# Patient Record
Sex: Male | Born: 1975 | Race: Black or African American | Hispanic: No | Smoking: Current some day smoker
Health system: Southern US, Community
[De-identification: ages and names within clinical notes are randomized; demographics above are authoritative.]

## PROBLEM LIST (undated history)

## (undated) DIAGNOSIS — F419 Anxiety disorder, unspecified: Secondary | ICD-10-CM

## (undated) DIAGNOSIS — Z789 Other specified health status: Secondary | ICD-10-CM

## (undated) DIAGNOSIS — N189 Chronic kidney disease, unspecified: Secondary | ICD-10-CM

## (undated) DIAGNOSIS — K219 Gastro-esophageal reflux disease without esophagitis: Secondary | ICD-10-CM

## (undated) DIAGNOSIS — F1910 Other psychoactive substance abuse, uncomplicated: Secondary | ICD-10-CM

## (undated) DIAGNOSIS — J329 Chronic sinusitis, unspecified: Secondary | ICD-10-CM

## (undated) DIAGNOSIS — F1410 Cocaine abuse, uncomplicated: Secondary | ICD-10-CM

## (undated) DIAGNOSIS — R569 Unspecified convulsions: Secondary | ICD-10-CM

## (undated) DIAGNOSIS — F32A Depression, unspecified: Secondary | ICD-10-CM

## (undated) DIAGNOSIS — F329 Major depressive disorder, single episode, unspecified: Secondary | ICD-10-CM

## (undated) HISTORY — DX: Chronic kidney disease, unspecified: N18.9

## (undated) HISTORY — PX: FEMUR SURGERY: SHX943

---

## 2007-07-24 ENCOUNTER — Emergency Department (HOSPITAL_COMMUNITY): Admission: EM | Admit: 2007-07-24 | Discharge: 2007-07-24 | Payer: Self-pay | Admitting: Emergency Medicine

## 2007-09-20 ENCOUNTER — Emergency Department (HOSPITAL_COMMUNITY): Admission: EM | Admit: 2007-09-20 | Discharge: 2007-09-20 | Payer: Self-pay | Admitting: Emergency Medicine

## 2008-08-11 ENCOUNTER — Emergency Department (HOSPITAL_COMMUNITY): Admission: EM | Admit: 2008-08-11 | Discharge: 2008-08-11 | Payer: Self-pay | Admitting: Family Medicine

## 2008-10-24 ENCOUNTER — Emergency Department (HOSPITAL_COMMUNITY): Admission: EM | Admit: 2008-10-24 | Discharge: 2008-10-24 | Payer: Self-pay | Admitting: Family Medicine

## 2009-05-10 ENCOUNTER — Emergency Department (HOSPITAL_COMMUNITY): Admission: EM | Admit: 2009-05-10 | Discharge: 2009-05-10 | Payer: Self-pay | Admitting: Emergency Medicine

## 2010-01-27 ENCOUNTER — Emergency Department (HOSPITAL_COMMUNITY): Admission: EM | Admit: 2010-01-27 | Discharge: 2010-01-28 | Payer: Self-pay | Admitting: Emergency Medicine

## 2010-02-06 ENCOUNTER — Emergency Department (HOSPITAL_COMMUNITY): Admission: EM | Admit: 2010-02-06 | Discharge: 2010-02-06 | Payer: Self-pay | Admitting: Emergency Medicine

## 2010-03-10 ENCOUNTER — Ambulatory Visit: Payer: Self-pay | Admitting: Internal Medicine

## 2010-03-10 ENCOUNTER — Observation Stay (HOSPITAL_COMMUNITY): Admission: EM | Admit: 2010-03-10 | Discharge: 2010-03-11 | Payer: Self-pay | Admitting: Emergency Medicine

## 2010-03-21 ENCOUNTER — Emergency Department (HOSPITAL_COMMUNITY): Admission: EM | Admit: 2010-03-21 | Discharge: 2010-03-21 | Payer: Self-pay | Admitting: Emergency Medicine

## 2010-05-01 ENCOUNTER — Emergency Department (HOSPITAL_COMMUNITY): Admission: EM | Admit: 2010-05-01 | Discharge: 2010-05-01 | Payer: Self-pay | Admitting: Emergency Medicine

## 2010-05-11 ENCOUNTER — Emergency Department (HOSPITAL_COMMUNITY): Admission: EM | Admit: 2010-05-11 | Discharge: 2010-05-11 | Payer: Self-pay | Admitting: Emergency Medicine

## 2010-05-26 ENCOUNTER — Emergency Department (HOSPITAL_COMMUNITY): Admission: EM | Admit: 2010-05-26 | Discharge: 2010-05-26 | Payer: Self-pay | Admitting: Emergency Medicine

## 2010-07-18 ENCOUNTER — Emergency Department (HOSPITAL_COMMUNITY): Admission: EM | Admit: 2010-07-18 | Discharge: 2010-07-18 | Payer: Self-pay | Admitting: Emergency Medicine

## 2010-07-28 ENCOUNTER — Emergency Department (HOSPITAL_COMMUNITY): Admission: EM | Admit: 2010-07-28 | Discharge: 2010-07-28 | Payer: Self-pay | Admitting: Family Medicine

## 2010-07-28 ENCOUNTER — Emergency Department (HOSPITAL_COMMUNITY): Admission: EM | Admit: 2010-07-28 | Discharge: 2010-07-28 | Payer: Self-pay | Admitting: Emergency Medicine

## 2010-11-22 LAB — CBC
HCT: 42.5 % (ref 39.0–52.0)
MCH: 32.1 pg (ref 26.0–34.0)
MCHC: 33.9 g/dL (ref 30.0–36.0)
MCV: 94.7 fL (ref 78.0–100.0)
RDW: 12.1 % (ref 11.5–15.5)

## 2010-11-22 LAB — URINALYSIS, ROUTINE W REFLEX MICROSCOPIC
Bilirubin Urine: NEGATIVE
Nitrite: NEGATIVE
Specific Gravity, Urine: 1.021 (ref 1.005–1.030)
pH: 7 (ref 5.0–8.0)

## 2010-11-22 LAB — RAPID URINE DRUG SCREEN, HOSP PERFORMED
Barbiturates: NOT DETECTED
Cocaine: POSITIVE — AB
Opiates: NOT DETECTED

## 2010-11-22 LAB — COMPREHENSIVE METABOLIC PANEL
ALT: 15 U/L (ref 0–53)
AST: 18 U/L (ref 0–37)
Alkaline Phosphatase: 55 U/L (ref 39–117)
CO2: 24 mEq/L (ref 19–32)
GFR calc Af Amer: >60 mL/min (ref >60)
Glucose, Bld: 94 mg/dL (ref 70–99)
Potassium: 4.2 mEq/L (ref 3.5–5.1)
Sodium: 135 mEq/L (ref 135–145)
Total Protein: 7.6 g/dL (ref 6.0–8.3)

## 2010-11-22 LAB — DIFFERENTIAL
Basophils Absolute: 0.1 10*3/uL (ref 0.0–0.1)
Basophils Relative: 1 % (ref 0–1)
Eosinophils Relative: 2 % (ref 0–5)
Monocytes Absolute: 0.6 10*3/uL (ref 0.1–1.0)
Monocytes Relative: 6 % (ref 3–12)

## 2010-11-22 LAB — ACETAMINOPHEN LEVEL: Acetaminophen (Tylenol), Serum: <10 ug/mL — ABNORMAL LOW (ref 10–30)

## 2010-11-22 LAB — SALICYLATE LEVEL: Salicylate Lvl: 8.9 mg/dL (ref 2.8–20.0)

## 2010-11-25 LAB — POCT I-STAT, CHEM 8
BUN: 11 mg/dL (ref 6–23)
Calcium, Ion: 1.21 mmol/L (ref 1.12–1.32)
Chloride: 106 mEq/L (ref 96–112)
Creatinine, Ser: 1.3 mg/dL (ref 0.4–1.5)
Glucose, Bld: 128 mg/dL — ABNORMAL HIGH (ref 70–99)
Glucose, Bld: 132 mg/dL — ABNORMAL HIGH (ref 70–99)
HCT: 42 % (ref 39.0–52.0)
Hemoglobin: 14.3 g/dL (ref 13.0–17.0)
Hemoglobin: 15 g/dL (ref 13.0–17.0)
Potassium: 3.4 mEq/L — ABNORMAL LOW (ref 3.5–5.1)
TCO2: 24 mmol/L (ref 0–100)

## 2010-11-25 LAB — CBC
HCT: 38.4 % — ABNORMAL LOW (ref 39.0–52.0)
HCT: 40.6 % (ref 39.0–52.0)
Hemoglobin: 13.1 g/dL (ref 13.0–17.0)
Hemoglobin: 14.3 g/dL (ref 13.0–17.0)
MCH: 32 pg (ref 26.0–34.0)
MCH: 33.3 pg (ref 26.0–34.0)
MCHC: 34.1 g/dL (ref 30.0–36.0)
MCHC: 35.2 g/dL (ref 30.0–36.0)
RBC: 4.3 MIL/uL (ref 4.22–5.81)
RDW: 12.3 % (ref 11.5–15.5)

## 2010-11-25 LAB — BASIC METABOLIC PANEL
CO2: 27 mEq/L (ref 19–32)
Calcium: 9.9 mg/dL (ref 8.4–10.5)
GFR calc Af Amer: >60 mL/min (ref >60)
Glucose, Bld: 134 mg/dL — ABNORMAL HIGH (ref 70–99)
Potassium: 3.2 mEq/L — ABNORMAL LOW (ref 3.5–5.1)
Sodium: 141 mEq/L (ref 135–145)

## 2010-11-25 LAB — URINALYSIS, ROUTINE W REFLEX MICROSCOPIC
Bilirubin Urine: NEGATIVE
Ketones, ur: NEGATIVE mg/dL
Nitrite: NEGATIVE
Protein, ur: NEGATIVE mg/dL
Urobilinogen, UA: 1 mg/dL (ref 0.0–1.0)

## 2010-11-25 LAB — DIFFERENTIAL
Basophils Absolute: 0 10*3/uL (ref 0.0–0.1)
Basophils Relative: 1 % (ref 0–1)
Eosinophils Absolute: 0.4 10*3/uL (ref 0.0–0.7)
Eosinophils Relative: 6 % — ABNORMAL HIGH (ref 0–5)
Lymphs Abs: 3.9 10*3/uL (ref 0.7–4.0)
Monocytes Absolute: 0.6 10*3/uL (ref 0.1–1.0)
Monocytes Absolute: 0.6 10*3/uL (ref 0.1–1.0)
Monocytes Relative: 8 % (ref 3–12)
Monocytes Relative: 9 % (ref 3–12)
Neutro Abs: 3.1 10*3/uL (ref 1.7–7.7)
Neutro Abs: 3.2 10*3/uL (ref 1.7–7.7)
Neutrophils Relative %: 40 % — ABNORMAL LOW (ref 43–77)

## 2010-11-25 LAB — RAPID URINE DRUG SCREEN, HOSP PERFORMED
Amphetamines: NOT DETECTED
Barbiturates: NOT DETECTED
Benzodiazepines: NOT DETECTED
Cocaine: POSITIVE — AB

## 2010-11-25 LAB — PROTIME-INR: INR: 0.9 (ref 0.00–1.49)

## 2010-11-25 LAB — POCT CARDIAC MARKERS
CKMB, poc: <1 ng/mL — ABNORMAL LOW (ref 1.0–8.0)
Troponin i, poc: <0.05 ng/mL (ref 0.00–0.09)

## 2010-11-25 LAB — URINE MICROSCOPIC-ADD ON

## 2010-11-27 LAB — BASIC METABOLIC PANEL
BUN: 19 mg/dL (ref 6–23)
Calcium: 9 mg/dL (ref 8.4–10.5)
Chloride: 106 mEq/L (ref 96–112)
GFR calc Af Amer: >60 mL/min (ref >60)
GFR calc Af Amer: >60 mL/min (ref >60)
GFR calc non Af Amer: >60 mL/min (ref >60)
GFR calc non Af Amer: >60 mL/min (ref >60)
Glucose, Bld: 96 mg/dL (ref 70–99)
Potassium: 4.2 mEq/L (ref 3.5–5.1)
Potassium: 3.7 mEq/L (ref 3.5–5.1)
Sodium: 141 mEq/L (ref 135–145)
Sodium: 138 mEq/L (ref 135–145)

## 2010-11-27 LAB — RAPID URINE DRUG SCREEN, HOSP PERFORMED
Amphetamines: NOT DETECTED
Barbiturates: NOT DETECTED
Benzodiazepines: NOT DETECTED
Cocaine: POSITIVE — AB
Opiates: NOT DETECTED
Tetrahydrocannabinol: NOT DETECTED

## 2010-11-27 LAB — CBC
HCT: 34.3 % — ABNORMAL LOW (ref 39.0–52.0)
HCT: 43.2 % (ref 39.0–52.0)
Hemoglobin: 11.8 g/dL — ABNORMAL LOW (ref 13.0–17.0)
Hemoglobin: 14.8 g/dL (ref 13.0–17.0)
MCH: 34.4 pg — ABNORMAL HIGH (ref 26.0–34.0)
MCHC: 34.3 g/dL (ref 30.0–36.0)
MCV: 99.8 fL (ref 78.0–100.0)
RBC: 3.45 MIL/uL — ABNORMAL LOW (ref 4.22–5.81)
RBC: 4.32 MIL/uL (ref 4.22–5.81)
RDW: 12.4 % (ref 11.5–15.5)
RDW: 12.7 % (ref 11.5–15.5)
WBC: 7 10*3/uL (ref 4.0–10.5)
WBC: 11.3 10*3/uL — ABNORMAL HIGH (ref 4.0–10.5)

## 2010-11-27 LAB — POCT I-STAT, CHEM 8
Calcium, Ion: 1.23 mmol/L (ref 1.12–1.32)
Creatinine, Ser: 1.4 mg/dL (ref 0.4–1.5)
Glucose, Bld: 109 mg/dL — ABNORMAL HIGH (ref 70–99)
HCT: 42 % (ref 39.0–52.0)
Hemoglobin: 14.3 g/dL (ref 13.0–17.0)
Potassium: 3.7 mEq/L (ref 3.5–5.1)
TCO2: 25 mmol/L (ref 0–100)

## 2010-11-27 LAB — URINALYSIS, ROUTINE W REFLEX MICROSCOPIC
Glucose, UA: NEGATIVE mg/dL
Hgb urine dipstick: NEGATIVE
Ketones, ur: 15 mg/dL — AB
Protein, ur: NEGATIVE mg/dL
Urobilinogen, UA: 1 mg/dL (ref 0.0–1.0)

## 2010-11-27 LAB — DIFFERENTIAL
Basophils Absolute: 0 10*3/uL (ref 0.0–0.1)
Basophils Absolute: 0 10*3/uL (ref 0.0–0.1)
Basophils Relative: 1 % (ref 0–1)
Eosinophils Absolute: 0.2 10*3/uL (ref 0.0–0.7)
Lymphocytes Relative: 45 % (ref 12–46)
Lymphs Abs: 5 10*3/uL — ABNORMAL HIGH (ref 0.7–4.0)
Monocytes Absolute: 0.6 10*3/uL (ref 0.1–1.0)
Neutro Abs: 6.1 10*3/uL (ref 1.7–7.7)
Neutro Abs: 5.4 10*3/uL (ref 1.7–7.7)
Neutrophils Relative %: 62 % (ref 43–77)

## 2010-11-27 LAB — POCT CARDIAC MARKERS
CKMB, poc: 1 ng/mL (ref 1.0–8.0)
Myoglobin, poc: 28.7 ng/mL (ref 12–200)
Troponin i, poc: <0.05 ng/mL (ref 0.00–0.09)

## 2010-11-27 LAB — CARDIAC PANEL(CRET KIN+CKTOT+MB+TROPI)
CK, MB: 1.7 ng/mL (ref 0.3–4.0)
Relative Index: 0.8 (ref 0.0–2.5)
Total CK: 208 U/L (ref 7–232)
Troponin I: 0.02 ng/mL (ref 0.00–0.06)

## 2010-11-27 LAB — ETHANOL
Alcohol, Ethyl (B): <5 mg/dL (ref 0–10)
Alcohol, Ethyl (B): <5 mg/dL (ref 0–10)

## 2010-11-27 LAB — URINE MICROSCOPIC-ADD ON

## 2010-11-27 LAB — ACETAMINOPHEN LEVEL
Acetaminophen (Tylenol), Serum: <10 ug/mL — ABNORMAL LOW (ref 10–30)
Acetaminophen (Tylenol), Serum: <10 ug/mL — ABNORMAL LOW (ref 10–30)

## 2010-11-28 LAB — BASIC METABOLIC PANEL
BUN: 12 mg/dL (ref 6–23)
CO2: 29 mEq/L (ref 19–32)
Chloride: 105 mEq/L (ref 96–112)
GFR calc Af Amer: >60 mL/min (ref >60)
Potassium: 4.3 mEq/L (ref 3.5–5.1)

## 2010-11-28 LAB — CBC
HCT: 38.1 % — ABNORMAL LOW (ref 39.0–52.0)
MCHC: 34.2 g/dL (ref 30.0–36.0)
MCV: 99.1 fL (ref 78.0–100.0)
RBC: 3.85 MIL/uL — ABNORMAL LOW (ref 4.22–5.81)
RBC: 3.94 MIL/uL — ABNORMAL LOW (ref 4.22–5.81)
WBC: 6.5 10*3/uL (ref 4.0–10.5)
WBC: 7.4 10*3/uL (ref 4.0–10.5)

## 2010-11-28 LAB — POCT I-STAT, CHEM 8
Chloride: 111 mEq/L (ref 96–112)
Glucose, Bld: 86 mg/dL (ref 70–99)
HCT: 41 % (ref 39.0–52.0)
Potassium: 3.6 mEq/L (ref 3.5–5.1)
Sodium: 140 mEq/L (ref 135–145)

## 2010-11-28 LAB — POCT CARDIAC MARKERS
CKMB, poc: <1 ng/mL — ABNORMAL LOW (ref 1.0–8.0)
CKMB, poc: <1 ng/mL — ABNORMAL LOW (ref 1.0–8.0)
Myoglobin, poc: 26.2 ng/mL (ref 12–200)
Myoglobin, poc: 42.6 ng/mL (ref 12–200)
Troponin i, poc: <0.05 ng/mL (ref 0.00–0.09)

## 2010-11-28 LAB — DIFFERENTIAL
Basophils Relative: 1 % (ref 0–1)
Monocytes Relative: 9 % (ref 3–12)
Neutro Abs: 3.5 10*3/uL (ref 1.7–7.7)
Neutrophils Relative %: 47 % (ref 43–77)

## 2011-01-22 ENCOUNTER — Emergency Department (HOSPITAL_COMMUNITY): Payer: Self-pay

## 2011-01-22 ENCOUNTER — Inpatient Hospital Stay (HOSPITAL_COMMUNITY)
Admission: EM | Admit: 2011-01-22 | Discharge: 2011-01-23 | DRG: 394 | Disposition: A | Discharge status: Home / Self Care | Payer: Self-pay | Attending: Internal Medicine | Admitting: Internal Medicine

## 2011-01-22 DIAGNOSIS — F1420 Cocaine dependence, uncomplicated: Secondary | ICD-10-CM

## 2011-01-22 DIAGNOSIS — F17200 Nicotine dependence, unspecified, uncomplicated: Secondary | ICD-10-CM | POA: Diagnosis present

## 2011-01-22 DIAGNOSIS — E860 Dehydration: Secondary | ICD-10-CM | POA: Diagnosis present

## 2011-01-22 DIAGNOSIS — K920 Hematemesis: Secondary | ICD-10-CM | POA: Diagnosis present

## 2011-01-22 DIAGNOSIS — IMO0002 Reserved for concepts with insufficient information to code with codable children: Secondary | ICD-10-CM | POA: Diagnosis present

## 2011-01-22 DIAGNOSIS — R0789 Other chest pain: Secondary | ICD-10-CM | POA: Diagnosis present

## 2011-01-22 DIAGNOSIS — T189XXA Foreign body of alimentary tract, part unspecified, initial encounter: Principal | ICD-10-CM | POA: Diagnosis present

## 2011-01-22 DIAGNOSIS — F1210 Cannabis abuse, uncomplicated: Secondary | ICD-10-CM | POA: Diagnosis present

## 2011-01-22 DIAGNOSIS — K219 Gastro-esophageal reflux disease without esophagitis: Secondary | ICD-10-CM | POA: Diagnosis present

## 2011-01-22 DIAGNOSIS — F1010 Alcohol abuse, uncomplicated: Secondary | ICD-10-CM | POA: Diagnosis present

## 2011-01-22 DIAGNOSIS — F1410 Cocaine abuse, uncomplicated: Secondary | ICD-10-CM | POA: Diagnosis present

## 2011-01-22 LAB — CBC
MCHC: 35 g/dL (ref 30.0–36.0)
Platelets: 178 10*3/uL (ref 150–400)
RDW: 12.6 % (ref 11.5–15.5)
WBC: 8.6 10*3/uL (ref 4.0–10.5)

## 2011-01-22 LAB — POCT CARDIAC MARKERS
CKMB, poc: 1.2 ng/mL (ref 1.0–8.0)
Myoglobin, poc: 148 ng/mL (ref 12–200)

## 2011-01-22 LAB — DIFFERENTIAL
Basophils Absolute: 0 10*3/uL (ref 0.0–0.1)
Basophils Relative: 0 % (ref 0–1)
Eosinophils Absolute: 0.1 10*3/uL (ref 0.0–0.7)
Eosinophils Relative: 1 % (ref 0–5)
Lymphocytes Relative: 40 % (ref 12–46)

## 2011-01-22 LAB — BASIC METABOLIC PANEL
BUN: 13 mg/dL (ref 6–23)
CO2: 32 mEq/L (ref 19–32)
Calcium: 11.4 mg/dL — ABNORMAL HIGH (ref 8.4–10.5)
GFR calc non Af Amer: >60 mL/min (ref >60)
Glucose, Bld: 86 mg/dL (ref 70–99)

## 2011-01-22 LAB — CARDIAC PANEL(CRET KIN+CKTOT+MB+TROPI)
CK, MB: 2.3 ng/mL (ref 0.3–4.0)
CK, MB: 2.6 ng/mL (ref 0.3–4.0)
Relative Index: 0.3 (ref 0.0–2.5)
Total CK: 691 U/L — ABNORMAL HIGH (ref 7–232)
Total CK: 769 U/L — ABNORMAL HIGH (ref 7–232)

## 2011-01-22 NOTE — H&P (Signed)
Henry Stanley, Henry Stanley NO.:  [id number]  MEDICAL RECORD NO.:  [id number]           PATIENT TYPE:  E  LOCATION:  [facility]                         FACILITY:  [facility]  PHYSICIAN:  Andreas Blower, MD       DATE OF BIRTH:  September 02, 1976  DATE OF ADMISSION:  01/22/2011 DATE OF DISCHARGE:                             HISTORY & PHYSICAL   PRIMARY CARE PHYSICIAN:  The patient does not have one.  CHIEF COMPLAINT:  Chest pain.  HISTORY OF PRESENT ILLNESS:  Henry Stanley is a 35 year old gentleman with history of polysubstance abuse, GERD, who presented with complaint of chest pain.  The patient reports that he daily snorts cocaine as well as uses marijuana daily.  He also smokes one-pack per day of cigarettes and drinks one pint of alcohol every other day.  Around 11:30 p.m. on Jan 21, 2011, the patient swallowed a plastic bag of cocaine worth about 3 g last night as somebody was looking for that cocaine.  Subsequently after that the patient drank a large amounts of milk and tried to self-induce vomiting.  He reports that he tried inducing the vomiting quite forcefully and at times had tinge of blood with emesis.  He was not able to bring the bag of cocaine up as a result he presented to the ER for further evaluation.  Given the forceful induction of vomiting, he then also had some chest pain.  While in the ER, his chest pain has significantly improved.  The patient denies any recent fevers or chills. Denies any current nausea or vomiting.  Does complain about chest pain mainly located in his chest.  Does not complain of any radiating pain down his arms or to his neck.  Denies any shortness of breath.  Denies any abdominal pain, diarrhea.  Does complain about headaches, which he reports is chronic in nature.  Denies any vision changes.  REVIEW OF SYSTEMS:  All systems were reviewed with the patient was positive as per HPI, otherwise all other systems are negative.  PAST MEDICAL  HISTORY: 1. History of polysubstance abuse including daily use of cocaine and     marijuana.  He does smoke one-pack per day of cigarettes and drinks     a pint of alcohol every other day. 2. History of GERD. 3. Cocaine use. 4. Marijuana abuse. 5. Alcohol abuse. 6. Tobacco abuse.  SOCIAL HISTORY:  The patient smokes one-pack per day daily and drinks one point of vodka every other day, snorts cocaine and uses marijuana daily.  FAMILY HISTORY:  Significant for mother and father in good health, has two brothers.  He is uncertain of his siblings health conditions.  PHYSICAL EXAMINATION:  VITALS:  Temperature is 98.1, blood pressure is 111/72, heart rate 82, respiration 18, satting at 99% on room air. GENERAL:  The patient was alert and oriented, did not appear to be any acute distress.  He was lying in bed comfortably. HEENT:  Extraocular motions are intact.  Pupils equal, round.  Had moist mucous membranes. NECK:  Supple. HEART:  Regular with S1, S2. LUNGS:  Clear to auscultation bilaterally.  ABDOMEN:  Soft, nontender, nondistended.  Positive bowel sounds. EXTREMITIES:  The patient had good peripheral pulses with trace edema. NEURO:  Cranial nerves II-XII grossly intact, had 5/5 motor strength in upper as well as lower extremities.  RADIOLOGY/IMAGING:  No imaging obtained.  LABORATORY DATA:  CBC shows a white count of 8.6, hemoglobin 15.0, hematocrit 42.8, platelet count 178.  Electrolytes normal with a creatinine of 1.86, calcium is 11.4.  ASSESSMENT/PLAN: 1. Chest pain may be a combination of cocaine use and also forceful     induction of vomiting.  We will admit the patient to tele, and we     will trend his troponins to rule him out for acute coronary     syndrome. 2. Cocaine abuse with accidental ingestion of bag of cocaine.  Poison     control was contacted.  Poison Control recommended giving the     patient charcoal, which has already been given and supportive      management with fluids.  If the patient is tachycardic, Poison     Control recommended giving p.r.n. benzo as needed. 3. Polysubstance abuse.  Given the patient's history of cocaine,     marijuana abuse, we will get Psychiatry involved to help with     cessation and long-term management of his polysubstance abuse. 4. Tobacco abuse,  p.r.n. nicotine patch is given.  I also consulted     the patient on smoking cessation. 5. Alcohol abuse.  We will place the patient on CIWA protocol.  We     will start the patient on thiamine and folate. 6. Mild hypercalcemia may be due to dehydration.  We will reassess     after adequately hydrating the patient. Hypercalemia may also     be due to injestion of large quantity of milk prior to      dmission. 7. Gastroesophageal reflux disease.  Continue the patient on PPI. 8. Mild hematemesis may be due to forceful induction of vomiting.  We     will continue to monitor for any further episodes of hematemesis. 9. Prophylaxis, Lovenox for DVT prophylaxis.  DISPOSITION:  Pending.  CODE STATUS:  The patient is full code.  Time spent on admission, talking to the patient and coordinating care was 45 minutes.   Andreas Blower, MD   SR/MEDQ  D:  01/22/2011  T:  01/22/2011  Job:  [id number]  Electronically Signed by Wardell Heath Vennesa Bastedo  on 01/22/2011 11:36:28 AM

## 2011-01-23 DIAGNOSIS — F1420 Cocaine dependence, uncomplicated: Secondary | ICD-10-CM

## 2011-01-23 LAB — BASIC METABOLIC PANEL WITH GFR
BUN: 9 mg/dL (ref 6–23)
CO2: 25 meq/L (ref 19–32)
Calcium: 9.5 mg/dL (ref 8.4–10.5)
Chloride: 105 meq/L (ref 96–112)
Creatinine, Ser: 0.93 mg/dL (ref 0.4–1.5)
GFR calc non Af Amer: >60 mL/min
Glucose, Bld: 87 mg/dL (ref 70–99)
Potassium: 3.7 meq/L (ref 3.5–5.1)
Sodium: 139 meq/L (ref 135–145)

## 2011-01-23 LAB — CBC
Hemoglobin: 13.4 g/dL (ref 13.0–17.0)
MCH: 33.2 pg (ref 26.0–34.0)
MCHC: 35.1 g/dL (ref 30.0–36.0)
MCV: 94.6 fL (ref 78.0–100.0)
RBC: 4.04 MIL/uL — ABNORMAL LOW (ref 4.22–5.81)

## 2011-02-04 NOTE — Discharge Summary (Signed)
  NAMEZAHARI, XIANG NO.:  [id number]  MEDICAL RECORD NO.:  [id number]           PATIENT TYPE:  I  LOCATION:  3714                         FACILITY:  [facility]  PHYSICIAN:  Lonia Blood, M.D.       DATE OF BIRTH:  1976/02/23  DATE OF ADMISSION:  01/22/2011 DATE OF DISCHARGE:  01/23/2011                              DISCHARGE SUMMARY   PRIMARY CARE PHYSICIAN:  The patient has been referred to [facility].  DISCHARGE DIAGNOSES: 1. Chest pain due to cocaine use. 2. Accidental ingestion of a bag of cocaine. 3. Tobacco abuse. 4. Alcohol abuse. 5. Gastroesophageal reflux disease.  DISCHARGE MEDICATIONS: 1. Multivitamin 1 tablet daily. 2. Nicotine patch 21 mg daily.  CONDITION ON DISCHARGE:  Mr. Ehrler is discharged in good condition.  He was scheduled to follow up at [facility].  PROCEDURE THIS ADMISSION:  The patient underwent a chest x-ray PA and lateral, Jan 22, 2011, which was negative for any acute cardiopulmonary processes.  CONSULTATIONS DURING THIS ADMISSION:  The patient was seen in consultation by Dr. Rogers Blocker from Psychiatry.  HISTORY AND PHYSICAL:  Refer to dictated H and P which was done by Dr. Betti Cruz.  HOSPITAL COURSE:  Mr. Haskew is a 35 year old gentleman with polysubstance abuse who was admitted from the emergency department as he was having nausea, vomiting, and chest pain.  He apparently accidentally ingested a bag of cocaine.  He was monitored on telemetry and had cardiac markers checked through the night without any evidence for myocardial infarction.  He was hydrated with intravenous fluids and he remained hemodynamically stable.  The patient was seen in consultation by Dr. Rogers Blocker from Psychiatry who felt that he was not a threat to himself or others and that he would benefit from intensive outpatient psychiatric counseling. The patient was set up with [facility] for his  medical followup and discharged in good condition today, Jan 23, 2011.     Lonia Blood, M.D.     SL/MEDQ  D:  01/23/2011  T:  01/24/2011  Job:  [id number]  Electronically Signed by Lonia Blood M.D. on 02/04/2011 08:14:05 AM

## 2011-05-10 ENCOUNTER — Emergency Department (HOSPITAL_COMMUNITY)
Admission: EM | Admit: 2011-05-10 | Discharge: 2011-05-10 | Disposition: A | Discharge status: Home / Self Care | Payer: Self-pay | Attending: Emergency Medicine | Admitting: Emergency Medicine

## 2011-05-10 DIAGNOSIS — J3489 Other specified disorders of nose and nasal sinuses: Secondary | ICD-10-CM | POA: Insufficient documentation

## 2011-05-10 DIAGNOSIS — J329 Chronic sinusitis, unspecified: Secondary | ICD-10-CM | POA: Insufficient documentation

## 2011-06-01 LAB — INFLUENZA A+B VIRUS AG-DIRECT(RAPID): Inflenza A Ag: NEGATIVE

## 2011-06-10 ENCOUNTER — Emergency Department (HOSPITAL_COMMUNITY)
Admission: EM | Admit: 2011-06-10 | Discharge: 2011-06-10 | Disposition: A | Discharge status: Home / Self Care | Payer: Self-pay | Attending: Emergency Medicine | Admitting: Emergency Medicine

## 2011-06-10 ENCOUNTER — Inpatient Hospital Stay (INDEPENDENT_AMBULATORY_CARE_PROVIDER_SITE_OTHER)
Admission: RE | Admit: 2011-06-10 | Discharge: 2011-06-10 | Disposition: A | Discharge status: Home / Self Care | Payer: Self-pay | Source: Ambulatory Visit | Attending: Family Medicine | Admitting: Family Medicine

## 2011-06-10 DIAGNOSIS — L0231 Cutaneous abscess of buttock: Secondary | ICD-10-CM

## 2011-06-10 DIAGNOSIS — W57XXXA Bitten or stung by nonvenomous insect and other nonvenomous arthropods, initial encounter: Secondary | ICD-10-CM | POA: Insufficient documentation

## 2011-06-10 DIAGNOSIS — T148 Other injury of unspecified body region: Secondary | ICD-10-CM | POA: Insufficient documentation

## 2011-06-10 DIAGNOSIS — F17200 Nicotine dependence, unspecified, uncomplicated: Secondary | ICD-10-CM | POA: Insufficient documentation

## 2011-06-10 DIAGNOSIS — L03317 Cellulitis of buttock: Secondary | ICD-10-CM

## 2011-06-12 ENCOUNTER — Inpatient Hospital Stay (INDEPENDENT_AMBULATORY_CARE_PROVIDER_SITE_OTHER)
Admission: RE | Admit: 2011-06-12 | Discharge: 2011-06-12 | Disposition: A | Discharge status: Home / Self Care | Payer: Self-pay | Source: Ambulatory Visit | Attending: Family Medicine | Admitting: Family Medicine

## 2011-06-12 DIAGNOSIS — L03119 Cellulitis of unspecified part of limb: Secondary | ICD-10-CM

## 2011-06-13 LAB — CULTURE, ROUTINE-ABSCESS

## 2011-06-20 LAB — GC/CHLAMYDIA PROBE AMP, GENITAL: GC Probe Amp, Genital: NEGATIVE

## 2011-08-13 ENCOUNTER — Encounter: Payer: Self-pay | Admitting: Nurse Practitioner

## 2011-08-13 ENCOUNTER — Emergency Department (HOSPITAL_COMMUNITY): Payer: Self-pay

## 2011-08-13 ENCOUNTER — Emergency Department (HOSPITAL_COMMUNITY)
Admission: EM | Admit: 2011-08-13 | Discharge: 2011-08-13 | Disposition: A | Discharge status: Home / Self Care | Payer: Self-pay | Attending: Emergency Medicine | Admitting: Emergency Medicine

## 2011-08-13 DIAGNOSIS — R220 Localized swelling, mass and lump, head: Secondary | ICD-10-CM | POA: Insufficient documentation

## 2011-08-13 DIAGNOSIS — K1120 Sialoadenitis, unspecified: Secondary | ICD-10-CM | POA: Insufficient documentation

## 2011-08-13 DIAGNOSIS — R070 Pain in throat: Secondary | ICD-10-CM | POA: Insufficient documentation

## 2011-08-13 DIAGNOSIS — D72829 Elevated white blood cell count, unspecified: Secondary | ICD-10-CM | POA: Insufficient documentation

## 2011-08-13 DIAGNOSIS — R599 Enlarged lymph nodes, unspecified: Secondary | ICD-10-CM | POA: Insufficient documentation

## 2011-08-13 LAB — CBC
Hemoglobin: 13.4 g/dL (ref 13.0–17.0)
MCHC: 34.2 g/dL (ref 30.0–36.0)
Platelets: 179 10*3/uL (ref 150–400)
RDW: 12.2 % (ref 11.5–15.5)

## 2011-08-13 LAB — DIFFERENTIAL
Basophils Absolute: 0.1 10*3/uL (ref 0.0–0.1)
Basophils Relative: 1 % (ref 0–1)
Monocytes Relative: 10 % (ref 3–12)
Neutro Abs: 9.4 10*3/uL — ABNORMAL HIGH (ref 1.7–7.7)
Neutrophils Relative %: 77 % (ref 43–77)

## 2011-08-13 LAB — POCT I-STAT, CHEM 8
Chloride: 103 mEq/L (ref 96–112)
HCT: 40 % (ref 39.0–52.0)
Hemoglobin: 13.6 g/dL (ref 13.0–17.0)
Potassium: 3.9 mEq/L (ref 3.5–5.1)
Sodium: 141 mEq/L (ref 135–145)

## 2011-08-13 MED ORDER — IOHEXOL 300 MG/ML  SOLN
75.0000 mL | Freq: Once | INTRAMUSCULAR | Status: AC | PRN
  Administered 2011-08-13: 75 mL via INTRAVENOUS

## 2011-08-13 MED ORDER — MORPHINE SULFATE 4 MG/ML IJ SOLN
6.0000 mg | Freq: Once | INTRAMUSCULAR | Status: AC
  Administered 2011-08-13: 6 mg via INTRAVENOUS
  Filled 2011-08-13: qty 2

## 2011-08-13 MED ORDER — CLINDAMYCIN PHOSPHATE 900 MG/50ML IV SOLN
900.0000 mg | INTRAVENOUS | Status: AC
  Administered 2011-08-13: 900 mg via INTRAVENOUS
  Filled 2011-08-13: qty 50

## 2011-08-13 MED ORDER — SODIUM CHLORIDE 0.9 % IV BOLUS (SEPSIS)
1000.0000 mL | Freq: Once | INTRAVENOUS | Status: AC
Start: 2011-08-13 — End: 2011-08-13
  Administered 2011-08-13: 1000 mL via INTRAVENOUS

## 2011-08-13 MED ORDER — ONDANSETRON HCL 4 MG/2ML IJ SOLN
4.0000 mg | Freq: Once | INTRAMUSCULAR | Status: AC
Start: 2011-08-13 — End: 2011-08-13
  Administered 2011-08-13: 4 mg via INTRAVENOUS
  Filled 2011-08-13: qty 2

## 2011-08-13 MED ORDER — MORPHINE SULFATE 2 MG/ML IJ SOLN
INTRAMUSCULAR | Status: AC
  Administered 2011-08-13: 2 mg via INTRAVENOUS
  Filled 2011-08-13: qty 1

## 2011-08-13 MED ORDER — MORPHINE SULFATE 4 MG/ML IJ SOLN
6.0000 mg | Freq: Once | INTRAMUSCULAR | Status: AC
  Administered 2011-08-13: 4 mg via INTRAVENOUS
  Filled 2011-08-13: qty 1

## 2011-08-13 MED ORDER — CLINDAMYCIN HCL 150 MG PO CAPS: 300.0000 mg | ORAL_CAPSULE | Freq: Three times a day (TID) | ORAL | Status: AC

## 2011-08-13 MED ORDER — OXYCODONE-ACETAMINOPHEN 5-325 MG PO TABS: 1.0000 | ORAL_TABLET | Freq: Four times a day (QID) | ORAL | Status: AC | PRN

## 2011-08-13 MED ORDER — IOHEXOL 300 MG/ML  SOLN: 80.0000 mL | Freq: Once | INTRAMUSCULAR | Status: DC | PRN

## 2011-08-13 NOTE — ED Notes (Signed)
Noticed swelling to lymph nodes R side of throat 3 days ago. States painful to swallow and causes him to have headache and earache. Pt states "i think its an STD." denies any penile symptoms

## 2011-08-13 NOTE — ED Notes (Signed)
Pt AOx4, NAD, resp e/u, pt denies problems swallowing.  Pt states understanding of discharge instructions and denies questions at time of discharge.

## 2011-08-13 NOTE — ED Notes (Signed)
Pt. Very difficult Iv insertion.

## 2011-08-13 NOTE — ED Provider Notes (Signed)
0430 PM Pt care in CDU assumed by myself at shift change. Pt with several days sore throat and gradual onset right-sided neck swelling. Pain on exam with significant swelling to right parotid and submandibular region as well as tenderness to palpation. Mild leukocytosis, awaiting CT scan results to further evaluate neck mass. He is failing his oral secretions well and does not appear to be in any respiratory distress.   5:46 PM CT scan reading consistent with acute parotitis. I spoken with the ENT doctor, Dr. Annalee Genta, regarding this patient. He advises broad spectrum antibiotic such as clindamycin with one IV dose here and by mouth clindamycin for the next 10 days after discharge. I have discussed the plan with the patient who voices his understanding.   9:14 PM Patient has completed his IV clindamycin. He reports neck pain but feels he is able to swallow and denies any difficulty breathing. I will discharge him home with the recommendations as previously discussed with Dr. Annalee Genta. He has been instructed to call the ENT office if he has no improvement within 2-3 days to arrange followup. He has been advised to return to the emergency department for reevaluation if he develops any difficulty breathing, swallowing his medications, or fever.  Henry Stanley, [place] 08/13/11 2116

## 2011-08-13 NOTE — ED Provider Notes (Signed)
Medical screening examination/treatment/procedure(s) were performed by non-physician practitioner and as supervising physician I was immediately available for consultation/collaboration.   Gwyneth Sprout, MD 08/13/11 2302

## 2011-09-18 ENCOUNTER — Emergency Department (HOSPITAL_COMMUNITY)
Admission: EM | Admit: 2011-09-18 | Discharge: 2011-09-19 | Disposition: A | Discharge status: Home / Self Care | Payer: Self-pay

## 2011-09-18 ENCOUNTER — Encounter (HOSPITAL_COMMUNITY): Payer: Self-pay | Admitting: Emergency Medicine

## 2011-09-18 NOTE — ED Notes (Signed)
Pt did not answer x 1 

## 2011-09-18 NOTE — ED Notes (Signed)
Pt c/o abscess in buttocks x 1 week; pt denies drainage

## 2011-09-21 ENCOUNTER — Encounter (HOSPITAL_COMMUNITY): Payer: Self-pay | Admitting: *Deleted

## 2011-09-21 ENCOUNTER — Emergency Department (HOSPITAL_COMMUNITY)
Admission: EM | Admit: 2011-09-21 | Discharge: 2011-09-22 | Discharge status: Against Medical Advice | Payer: Self-pay | Attending: Emergency Medicine | Admitting: Emergency Medicine

## 2011-09-21 DIAGNOSIS — R509 Fever, unspecified: Secondary | ICD-10-CM | POA: Insufficient documentation

## 2011-09-21 DIAGNOSIS — K611 Rectal abscess: Secondary | ICD-10-CM

## 2011-09-21 DIAGNOSIS — K612 Anorectal abscess: Secondary | ICD-10-CM | POA: Insufficient documentation

## 2011-09-21 MED ORDER — HYDROMORPHONE HCL PF 1 MG/ML IJ SOLN
1.0000 mg | Freq: Once | INTRAMUSCULAR | Status: AC
  Administered 2011-09-21: 1 mg via INTRAVENOUS
  Filled 2011-09-21: qty 1

## 2011-09-21 MED ORDER — ONDANSETRON 4 MG PO TBDP
8.0000 mg | ORAL_TABLET | Freq: Once | ORAL | Status: AC
  Administered 2011-09-21: 8 mg via ORAL
  Filled 2011-09-21: qty 2

## 2011-09-21 NOTE — ED Provider Notes (Signed)
History     CSN: [id number]  Arrival date & time 09/21/11  5621   First MD Initiated Contact with Patient 09/21/11 2113      Chief Complaint  Patient presents with  . Recurrent Skin Infections    (Consider location/radiation/quality/duration/timing/severity/associated sxs/prior treatment) HPI History provided by pt.   Pt has had an abscess of left buttock for greater than one week.  Has gradually increased in size and is severely painful.  Associated w/ fever.  No difficulty w/ having a BM.  Pt has had several abscess in in the past.    History reviewed. No pertinent past medical history.  Past Surgical History  Procedure Date  . Femur surgery     No family history on file.  History  Substance Use Topics  . Smoking status: Current Everyday Smoker -- 0.5 packs/day  . Smokeless tobacco: Not on file  . Alcohol Use: No     rare      Review of Systems  All other systems reviewed and are negative.    Allergies  Review of patient's allergies indicates no known allergies.  Home Medications   Current Outpatient Rx  Name Route Sig Dispense Refill  . NAPROXEN SODIUM 220 MG PO TABS Oral Take 440 mg by mouth 2 (two) times daily as needed. For pain.      BP 99/60  Pulse 95  Temp(Src) 98 F (36.7 C) (Oral)  Resp 20  SpO2 100%  Physical Exam  Nursing note and vitals reviewed. Constitutional: He is oriented to person, place, and time. He appears well-developed and well-nourished. No distress.  HENT:  Head: Normocephalic and atraumatic.  Eyes:       Normal appearance  Neck: Normal range of motion.  Neurological: He is alert and oriented to person, place, and time.  Skin:       2.5cm, non-draining, fluctuant, tender abscess on left medial buttock.    Psychiatric: He has a normal mood and affect. His behavior is normal.    ED Course  Procedures (including critical care time)  Labs Reviewed - No data to display No results found.   No diagnosis  found.    MDM  Pt presents w/ abscess of left medial buttock.  Tama Gander, NP will resume care.          Otilio Miu, [place] 09/21/11 2225

## 2011-09-21 NOTE — ED Notes (Signed)
He has had a boil on his perineum for 7 days

## 2011-09-22 ENCOUNTER — Encounter (HOSPITAL_COMMUNITY): Payer: Self-pay | Admitting: Radiology

## 2011-09-22 ENCOUNTER — Encounter (HOSPITAL_COMMUNITY): Payer: Self-pay

## 2011-09-22 ENCOUNTER — Emergency Department (HOSPITAL_COMMUNITY)
Admission: EM | Admit: 2011-09-22 | Discharge: 2011-09-22 | Disposition: A | Discharge status: Home / Self Care | Payer: Self-pay | Attending: Emergency Medicine | Admitting: Emergency Medicine

## 2011-09-22 ENCOUNTER — Emergency Department (HOSPITAL_COMMUNITY): Payer: Self-pay

## 2011-09-22 DIAGNOSIS — K612 Anorectal abscess: Secondary | ICD-10-CM

## 2011-09-22 DIAGNOSIS — K611 Rectal abscess: Secondary | ICD-10-CM | POA: Diagnosis present

## 2011-09-22 DIAGNOSIS — F17200 Nicotine dependence, unspecified, uncomplicated: Secondary | ICD-10-CM | POA: Insufficient documentation

## 2011-09-22 DIAGNOSIS — K6289 Other specified diseases of anus and rectum: Secondary | ICD-10-CM | POA: Insufficient documentation

## 2011-09-22 LAB — DIFFERENTIAL
Basophils Absolute: 0 10*3/uL (ref 0.0–0.1)
Basophils Relative: 1 % (ref 0–1)
Eosinophils Absolute: 0.3 10*3/uL (ref 0.0–0.7)
Eosinophils Relative: 4 % (ref 0–5)
Lymphocytes Relative: 34 % (ref 12–46)
Lymphs Abs: 2.3 10*3/uL (ref 0.7–4.0)
Monocytes Absolute: 0.6 10*3/uL (ref 0.1–1.0)
Monocytes Relative: 10 % (ref 3–12)
Neutro Abs: 3.4 10*3/uL (ref 1.7–7.7)
Neutrophils Relative %: 52 % (ref 43–77)

## 2011-09-22 LAB — BASIC METABOLIC PANEL
BUN: 11 mg/dL (ref 6–23)
CO2: 28 mEq/L (ref 19–32)
Calcium: 9 mg/dL (ref 8.4–10.5)
Chloride: 100 mEq/L (ref 96–112)
Creatinine, Ser: 1 mg/dL (ref 0.50–1.35)
GFR calc Af Amer: >90 mL/min (ref >90)
GFR calc non Af Amer: >90 mL/min (ref >90)
Glucose, Bld: 103 mg/dL — ABNORMAL HIGH (ref 70–99)
Potassium: 3.8 mEq/L (ref 3.5–5.1)
Sodium: 133 mEq/L — ABNORMAL LOW (ref 135–145)

## 2011-09-22 LAB — CBC
HCT: 35.9 % — ABNORMAL LOW (ref 39.0–52.0)
Hemoglobin: 12.1 g/dL — ABNORMAL LOW (ref 13.0–17.0)
MCH: 31.9 pg (ref 26.0–34.0)
MCHC: 33.7 g/dL (ref 30.0–36.0)
MCV: 94.7 fL (ref 78.0–100.0)
Platelets: 162 10*3/uL (ref 150–400)
RBC: 3.79 MIL/uL — ABNORMAL LOW (ref 4.22–5.81)
RDW: 12.5 % (ref 11.5–15.5)
WBC: 6.6 10*3/uL (ref 4.0–10.5)

## 2011-09-22 MED ORDER — MORPHINE SULFATE 4 MG/ML IJ SOLN
4.0000 mg | INTRAMUSCULAR | Status: DC | PRN
  Administered 2011-09-22: 4 mg via INTRAVENOUS

## 2011-09-22 MED ORDER — MORPHINE SULFATE 4 MG/ML IJ SOLN
4.0000 mg | INTRAMUSCULAR | Status: DC | PRN
  Filled 2011-09-22: qty 1

## 2011-09-22 MED ORDER — IOHEXOL 300 MG/ML  SOLN
100.0000 mL | Freq: Once | INTRAMUSCULAR | Status: AC | PRN
  Administered 2011-09-22: 100 mL via INTRAVENOUS

## 2011-09-22 MED ORDER — IOHEXOL 300 MG/ML  SOLN
40.0000 mL | Freq: Once | INTRAMUSCULAR | Status: AC | PRN
  Administered 2011-09-22: 40 mL via ORAL
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
## 2011-09-22 NOTE — ED Notes (Signed)
SURGERY REPAGED TO CHECK ON DELAY

## 2011-09-22 NOTE — ED Notes (Signed)
States this has been ongoing for over a week. Progressively increasing in size and pain

## 2011-09-22 NOTE — ED Provider Notes (Signed)
Medical screening examination/treatment/procedure(s) were performed by non-physician practitioner and as supervising physician I was immediately available for consultation/collaboration.  Jasmine Awe, MD 09/22/11 [phone]

## 2011-09-22 NOTE — ED Notes (Signed)
Midlevel at bedside updating patient. Patient appears anxious and wants to know how much longer he has to wait

## 2011-09-22 NOTE — ED Provider Notes (Signed)
Medical screening examination/treatment/procedure(s) were performed by non-physician practitioner and as supervising physician I was immediately available for consultation/collaboration.   Kimber Fritts L Lymon Kidney, MD 09/22/11 1451 

## 2011-09-22 NOTE — ED Notes (Signed)
CT called and notified that pt has completed drinking contrast.

## 2011-09-22 NOTE — ED Notes (Signed)
Continues to await surgical consult

## 2011-09-22 NOTE — ED Provider Notes (Signed)
Medical screening examination/treatment/procedure(s) were performed by non-physician practitioner and as supervising physician I was immediately available for consultation/collaboration.  Jame Morrell K Zani Kyllonen-Rasch, MD 09/22/11 2322 

## 2011-09-22 NOTE — ED Provider Notes (Signed)
History     CSN: [id number]  Arrival date & time 09/21/11  4098   First MD Initiated Contact with Patient 09/21/11 2113      Chief Complaint  Patient presents with  . Recurrent Skin Infections     HPI See previous HPI per C Scheinlever, PA  History reviewed. No pertinent past medical history.  Past Surgical History  Procedure Date  . Femur surgery     No family history on file.  History  Substance Use Topics  . Smoking status: Current Everyday Smoker -- 0.5 packs/day  . Smokeless tobacco: Not on file  . Alcohol Use: No     rare      Review of Systems  Constitutional: Negative.   HENT: Negative.   Eyes: Negative.   Respiratory: Negative.   Cardiovascular: Negative.   Gastrointestinal: Negative.   Genitourinary: Negative.   Musculoskeletal: Negative.   Skin: Negative.   Neurological: Negative.   Hematological: Negative.   Psychiatric/Behavioral: Negative.     Allergies  Review of patient's allergies indicates no known allergies.  Home Medications   Current Outpatient Rx  Name Route Sig Dispense Refill  . NAPROXEN SODIUM 220 MG PO TABS Oral Take 440 mg by mouth 2 (two) times daily as needed. For pain.      BP 111/66  Pulse 79  Temp(Src) 97 F (36.1 C) (Oral)  Resp 16  SpO2 99%  Physical Exam  Constitutional: He appears well-developed and well-nourished.  HENT:  Head: Normocephalic and atraumatic.  Eyes: Conjunctivae are normal.  Neck: Neck supple.  Cardiovascular: Normal rate.   Pulmonary/Chest: Effort normal.  Abdominal: Soft.  Musculoskeletal: Normal range of motion.  Neurological: He is alert.  Skin: Skin is warm and dry.       Approx 2.5 cm rectal abscess to (L) medial buttocks. Rectal exam reveals fluctant tissue in (L) rectal vault. No active drainage.  Psychiatric: He has a normal mood and affect.    ED Course  Procedures Pt reports pain much improved w/ medication. I have discussed findings w/ Dr Estell Harpin. Will consult w/ general  surgery for plan.  2240: Spoke w/ Dr Luisa Hart who recommends ct pelvis for further clarification of rectal abscess.  0350: Awaiting Ct. Delay associated w/ sudden influx of trauma pt's.Pt verbalizes understanding.  Findings and clinical impression discussed w/ pt. Will consult w/ Dr Luisa Hart for plan.  0430:  I have discussed pt w/  Dr Luisa Hart who has agreed to come see pt in ED  0445: Pt states he must leave to go get his children who were placed in care of a neighbor. Very much wants to return as soon has he takes care of getting his children.           Risks ns benefits discussed. Pt has agreed to sign out AMA and states will return.  80:  Dr Luisa Hart arrived to dept and was informed pt had left but will return. He request to be paged if pt returns. Will place an AMA disposition. Labs Reviewed  CBC - Abnormal; Notable for the following:    RBC 3.79 (*)    Hemoglobin 12.1 (*)    HCT 35.9 (*)    All other components within normal limits  BASIC METABOLIC PANEL - Abnormal; Notable for the following:    Sodium 133 (*)    Glucose, Bld 103 (*)    All other components within normal limits  DIFFERENTIAL   Ct Abdomen Pelvis W Contrast  09/22/2011  *RADIOLOGY  REPORT*  Clinical Data: Abscess left gluteal region for greater than 1 week.  CT ABDOMEN AND PELVIS WITH CONTRAST  Technique:  Multidetector CT imaging of the abdomen and pelvis was performed following the standard protocol during bolus administration of intravenous contrast.  Contrast: 40mL OMNIPAQUE IOHEXOL 300 MG/ML IV SOLN, OMNIPAQUE IOHEXOL 300 MG/ML IV SOLN  Comparison: None.  Findings: Lung bases clear.  Liver and gallbladder appear normal. Pancreas and common bile duct normal.  Normal renal enhancement. The evaluation of the visceral structures is suboptimal due to the lack of body fat.  There is a very large stool burden, right-sided predominant.  Probable normal appendix (image 26) on the coronal set.  Bones appear within  normal limits.  Urinary bladder normal. Small bowel appears normal.  There is a small fluid collection associated with the inferior aspect of the rectum (image 93).  This measures 22 mm cranial- caudal, 19 mm transverse and 22 mm AP, compatible with a small perirectal abscess.  Calcifications are present in the left gluteal musculature, which appear post-traumatic associated with old femoral nail fixation.  IMPRESSION: 1.  Small perirectal abscess measuring 22 mm x 19 mm x 22 mm. 2.  Dystrophic calcifications in the left gluteal region associated with old antegrade left femoral nail. 3.  No acute intra-abdominal findings.  Large stool burden.  Original Report Authenticated By: Andreas Newport, M.D.     No diagnosis found.    MDM  Small peri-rectal abscess. Surgical eval peding        Leanne Chang, NP 09/22/11 1031  Roma Kayser Schorr, NP 09/22/11 [phone]

## 2011-09-22 NOTE — Op Note (Signed)
Pre-Op: (L)perirectal abscess Post-Op: Same  Procedure: I&D (L)perirectal abscess Surgeon: Brayton El PA-C, Harden Mo, MD Anes: local Condition: stable  Description: Pt prepped and draped in sterile manner. 2% lidocaine infiltrated to perirectal abscess to achieve adequate anesthesia. 1.5cm incision made with sterile #11 blade. Pus expressed. Septations broken up. Mild bleeding encountered. Wound then packed with 1/4" iodoform gauze and covered with dry dressing. Pt tolerated procedure well.  Marianna Fuss PA-C

## 2011-09-22 NOTE — ED Notes (Signed)
Patient states symptoms began over a week ago. States he is here for boil.

## 2011-09-22 NOTE — ED Notes (Signed)
I &D TRAY AND SUTURE CART PLACED AT PT ROOM FOR SURGEON USE IF NEEDED.

## 2011-09-22 NOTE — ED Provider Notes (Signed)
History     CSN: [id number]  Arrival date & time 09/22/11  0530   None     Chief Complaint  Patient presents with  . Abscess    HPI Patient is a 36 y.o. male presenting with abscess. The history is provided by the patient.  Abscess  This is a new problem. The current episode started more than one week ago. The onset was gradual. The problem has been gradually worsening. The abscess is present on the left buttock. The problem is moderate. The abscess is characterized by swelling, painfulness and redness. Pertinent negatives include no fever.  Patient reports greater than one week history of painful rectal abscess. Patient was evaluated earlier this evening and had a CT that showed a small perirectal abscess.  Dr. Luisa Hart was consulted and had agreed to see patient, however patient had to leave and take care of children that were being babysat by a neighbor. Patient has returned to the Department for treatment of perirectal abscess.  History reviewed. No pertinent past medical history.  Past Surgical History  Procedure Date  . Femur surgery     History reviewed. No pertinent family history.  History  Substance Use Topics  . Smoking status: Current Everyday Smoker -- 0.5 packs/day  . Smokeless tobacco: Not on file  . Alcohol Use: No     rare      Review of Systems  Constitutional: Negative.  Negative for fever.  HENT: Negative.   Eyes: Negative.   Respiratory: Negative.   Cardiovascular: Negative.   Gastrointestinal: Negative.   Genitourinary: Negative.   Musculoskeletal: Negative.   Skin: Negative.   Neurological: Negative.   Hematological: Negative.   Psychiatric/Behavioral: Negative.     Allergies  Review of patient's allergies indicates no known allergies.  Home Medications   Current Outpatient Rx  Name Route Sig Dispense Refill  . NAPROXEN SODIUM 220 MG PO TABS Oral Take 440 mg by mouth 2 (two) times daily as needed. For pain.      BP 110/76  Pulse 80   Temp(Src) 98.1 F (36.7 C) (Oral)  Resp 20  Ht 5\' 6"  (1.676 m)  Wt 140 lb (63.504 kg)  BMI 22.60 kg/m2  SpO2 99%  Physical Exam Unchanged from PE earlier this evening.  ED Course  Procedures  0530:  Patient has returned to the department for treatment of perirectal abscess. I have recalled Dr. Luisa Hart who is aware the patient has returned to the department for treatment.  Labs Reviewed - No data to display Ct Abdomen Pelvis W Contrast  09/22/2011  *RADIOLOGY REPORT*  Clinical Data: Abscess left gluteal region for greater than 1 week.  CT ABDOMEN AND PELVIS WITH CONTRAST  Technique:  Multidetector CT imaging of the abdomen and pelvis was performed following the standard protocol during bolus administration of intravenous contrast.  Contrast: 40mL OMNIPAQUE IOHEXOL 300 MG/ML IV SOLN, OMNIPAQUE IOHEXOL 300 MG/ML IV SOLN  Comparison: None.  Findings: Lung bases clear.  Liver and gallbladder appear normal. Pancreas and common bile duct normal.  Normal renal enhancement. The evaluation of the visceral structures is suboptimal due to the lack of body fat.  There is a very large stool burden, right-sided predominant.  Probable normal appendix (image 26) on the coronal set.  Bones appear within normal limits.  Urinary bladder normal. Small bowel appears normal.  There is a small fluid collection associated with the inferior aspect of the rectum (image 93).  This measures 22 mm cranial- caudal, 19  mm transverse and 22 mm AP, compatible with a small perirectal abscess.  Calcifications are present in the left gluteal musculature, which appear post-traumatic associated with old femoral nail fixation.  IMPRESSION: 1.  Small perirectal abscess measuring 22 mm x 19 mm x 22 mm. 2.  Dystrophic calcifications in the left gluteal region associated with old antegrade left femoral nail. 3.  No acute intra-abdominal findings.  Large stool burden.  Original Report Authenticated By: Andreas Newport, M.D.     No  diagnosis found.    MDM  Peri-rectal abscess        Leanne Chang, NP 09/22/11 [phone]

## 2011-09-22 NOTE — ED Notes (Signed)
General Surgery PA at bedside to drain abscess.

## 2011-09-26 ENCOUNTER — Encounter (INDEPENDENT_AMBULATORY_CARE_PROVIDER_SITE_OTHER): Payer: Self-pay

## 2011-11-24 NOTE — ED Provider Notes (Signed)
History     CSN: [id number]  Arrival date & time 08/13/11  1136   First MD Initiated Contact with Patient 08/13/11 1237      Chief Complaint  Patient presents with  . Lymphadenopathy    (Consider location/radiation/quality/duration/timing/severity/associated sxs/prior treatment) HPI Comments: Patient presents emergency department with chief complaint of sore throat.  Patient states that there is swelling on the right side of his neck and is painful for him to swallow.  In addition patient complains of headache and right-sided face pain.  Patient states concerned that STD, however patient denies any penile symptoms including discharge, genital sores, or pain.  Patient denies participating in oral sex.  Patient is a current everyday smoker and smokes half a pack a day.  Patient denies fevers, night sweats, chills.  The history is provided by the patient.    History reviewed. No pertinent past medical history.  Past Surgical History  Procedure Date  . Femur surgery     History reviewed. No pertinent family history.  History  Substance Use Topics  . Smoking status: Current Everyday Smoker -- 0.5 packs/day  . Smokeless tobacco: Not on file  . Alcohol Use: No     rare      Review of Systems  Constitutional: Negative for fever, chills and appetite change.  HENT: Positive for sore throat and trouble swallowing. Negative for congestion, drooling, dental problem and voice change.   Eyes: Negative for visual disturbance.  Respiratory: Negative for shortness of breath.   Cardiovascular: Negative for chest pain and leg swelling.  Gastrointestinal: Negative for abdominal pain.  Genitourinary: Negative for dysuria, urgency and frequency.  Neurological: Negative for dizziness, syncope, weakness, light-headedness, numbness and headaches.  Psychiatric/Behavioral: Negative for confusion.    Allergies  Review of patient's allergies indicates no known allergies.  Home Medications    Current Outpatient Rx  Name Route Sig Dispense Refill  . NAPROXEN SODIUM 220 MG PO TABS Oral Take 440 mg by mouth 2 (two) times daily as needed. For pain.      BP 143/87  Pulse 88  Temp(Src) 98.2 F (36.8 C) (Oral)  Resp 18  Ht 5\' 6"  (1.676 m)  Wt 160 lb (72.576 kg)  BMI 25.82 kg/m2  SpO2 99%  Physical Exam  Nursing note and vitals reviewed. Constitutional: He is oriented to person, place, and time. He appears well-developed and well-nourished. No distress.  HENT:  Head: Normocephalic and atraumatic. No trismus in the jaw.  Right Ear: Tympanic membrane, external ear and ear canal normal.  Left Ear: Tympanic membrane, external ear and ear canal normal.  Nose: Nose normal. No rhinorrhea. Right sinus exhibits no maxillary sinus tenderness and no frontal sinus tenderness. Left sinus exhibits no maxillary sinus tenderness and no frontal sinus tenderness.  Mouth/Throat: Uvula is midline and mucous membranes are normal. Normal dentition. No dental abscesses or uvula swelling. Posterior oropharyngeal edema present. No oropharyngeal exudate, posterior oropharyngeal erythema or tonsillar abscesses.       Right sided facial swelling w cervical lymphadenopathy. No submental edema, tongue not elevated, no trismus. No impending airway obstruction; Pt able to speak full sentences, swallow intact, no drooling, stridor, or tonsillar/uvula displacement. No palatal petechia  Eyes: Conjunctivae are normal.  Neck: Trachea normal, normal range of motion and full passive range of motion without pain. Neck supple. No rigidity. Erythema present. Normal range of motion present. No Brudzinski's sign noted.       Flexion and extension of neck without pain or difficulty. Able  to breath without difficulty in extension.  Cardiovascular: Normal rate and regular rhythm.   Pulmonary/Chest: Effort normal and breath sounds normal. No stridor. No respiratory distress. He has no wheezes.  Abdominal: Soft. There is no  tenderness.       No obvious evidence of splenomegaly. Non ttp.   Musculoskeletal: Normal range of motion.  Lymphadenopathy:       Head (right side): No preauricular and no posterior auricular adenopathy present.       Head (left side): No preauricular and no posterior auricular adenopathy present.    He has cervical adenopathy.  Neurological: He is alert and oriented to person, place, and time.  Skin: Skin is warm and dry. No rash noted. He is not diaphoretic.  Psychiatric: He has a normal mood and affect.    ED Course  Procedures (including critical care time)  Labs Reviewed  CBC - Abnormal; Notable for the following:    WBC 12.2 (*)    RBC 4.17 (*)    All other components within normal limits  DIFFERENTIAL - Abnormal; Notable for the following:    Neutro Abs 9.4 (*)    Monocytes Absolute 1.2 (*)    All other components within normal limits  POCT I-STAT, CHEM 8 - Abnormal; Notable for the following:    Glucose, Bld 118 (*)    All other components within normal limits  LAB REPORT - SCANNED   No results found.   1. Parotitis       MDM   Pt afebrile and stable with CT Neck pending. Pain managed in the ED. Care resumed by [facility], PA-C 11/24/11 [phone]

## 2011-11-24 NOTE — ED Provider Notes (Signed)
Medical screening examination/treatment/procedure(s) were performed by non-physician practitioner and as supervising physician I was immediately available for consultation/collaboration.   Gwyneth Sprout, MD 11/24/11 [phone]

## 2012-03-10 ENCOUNTER — Emergency Department (INDEPENDENT_AMBULATORY_CARE_PROVIDER_SITE_OTHER)
Admission: EM | Admit: 2012-03-10 | Discharge: 2012-03-10 | Disposition: A | Discharge status: Home / Self Care | Payer: Self-pay | Source: Home / Self Care | Attending: Family Medicine | Admitting: Family Medicine

## 2012-03-10 ENCOUNTER — Encounter (HOSPITAL_COMMUNITY): Payer: Self-pay | Admitting: Emergency Medicine

## 2012-03-10 DIAGNOSIS — L0390 Cellulitis, unspecified: Secondary | ICD-10-CM

## 2012-03-10 DIAGNOSIS — L0291 Cutaneous abscess, unspecified: Secondary | ICD-10-CM

## 2012-03-10 MED ORDER — MUPIROCIN CALCIUM 2 % NA OINT: TOPICAL_OINTMENT | NASAL | Status: DC

## 2012-03-10 MED ORDER — SULFAMETHOXAZOLE-TRIMETHOPRIM 800-160 MG PO TABS: 1.0000 | ORAL_TABLET | Freq: Two times a day (BID) | ORAL | Status: DC

## 2012-03-10 NOTE — Discharge Instructions (Signed)
Use tylenol for pain if needed (follow instructions on the box).  Use warm compresses at least 3 times a day on your abscess, more often if you can.  Come back to Urgent Care Tuesday or Wednesday at the latest.  Sooner if you feel you are getting much worse.    Abscess An abscess (boil or furuncle) is an infected area under your skin. This area is filled with yellowish white fluid (pus). HOME CARE   Only take medicine as told by your doctor.   Keep the skin clean around your abscess. Keep clothes that may touch the abscess clean.   Avoid direct skin contact with other people. The infection can spread by skin contact with others.   Practice good hygiene and do not share personal care items.   Do not share athletic equipment, towels, or whirlpools. Shower after every practice or work out session.   See your doctor for a follow-up visit as told.  GET HELP RIGHT AWAY IF:   There is more pain, puffiness (swelling), and redness in the wound site.   There is fluid or bleeding from the wound site.   You have muscle aches, chills, fever, or feel sick.   You or your child has a temperature by mouth above 102 F (38.9 C), not controlled by medicine.   Your baby is older than 3 months with a rectal temperature of 102 F (38.9 C) or higher.  MAKE SURE YOU:   Understand these instructions.   Will watch your condition.   Will get help right away if you are not doing well or get worse.  Document Released: 02/14/2008 Document Revised: 08/17/2011 Document Reviewed: 02/14/2008 [facility] Patient Information 2012 [place], [place].

## 2012-03-10 NOTE — ED Provider Notes (Signed)
History     CSN: [id number]  Arrival date & time 03/10/12  1107   First MD Initiated Contact with Patient 03/10/12 1108      Chief Complaint  Patient presents with  . Knee Pain    (Consider location/radiation/quality/duration/timing/severity/associated sxs/prior treatment) HPI Comments: Pt noticed 4 small areas on body of skin infection/pimple/abscess a week ago.  One on R neck, one on L hip, one in middle of chest. These have resolved.  The 4th one is on R medial knee and has progressively gotten worse, increasingly painful and red.  Pt picked at it when it was small and a small amount of pus came out.  Now it is much bigger, and no pus is draining.   Patient is a 36 y.o. male presenting with rash. The history is provided by the patient.  Rash  This is a new problem. Episode onset: a week ago. The problem has been gradually worsening. The problem is associated with nothing. There has been no fever. The rash is present on the right upper leg. The pain is at a severity of 8/10. The pain has been constant since onset. Associated symptoms include pain. Pertinent negatives include no itching. Treatments tried: warm compresses. The treatment provided no relief.    History reviewed. No pertinent past medical history.  Past Surgical History  Procedure Date  . Femur surgery     History reviewed. No pertinent family history.  History  Substance Use Topics  . Smoking status: Current Everyday Smoker -- 0.5 packs/day  . Smokeless tobacco: Not on file  . Alcohol Use: Yes     rare      Review of Systems  Constitutional: Negative for fever and chills.  Musculoskeletal:       Pain in Knee is in skin, not knee joint  Skin: Positive for color change and rash. Negative for itching.    Allergies  Review of patient's allergies indicates no known allergies.  Home Medications   Current Outpatient Rx  Name Route Sig Dispense Refill  . MUPIROCIN CALCIUM 2 % NA OINT  Apply in each  nostril daily for 10 days 1 g 0  . NAPROXEN SODIUM 220 MG PO TABS Oral Take 440 mg by mouth 2 (two) times daily as needed. For pain.    . SULFAMETHOXAZOLE-TRIMETHOPRIM 800-160 MG PO TABS Oral Take 1 tablet by mouth every 12 (twelve) hours. 20 tablet 0    BP 94/56  Pulse 98  Temp 97.7 F (36.5 C) (Oral)  Resp 18  SpO2 95%  Physical Exam  Constitutional: He is oriented to person, place, and time. He appears well-developed and well-nourished. No distress.  Pulmonary/Chest: Effort normal.  Musculoskeletal:       Right knee: He exhibits erythema. He exhibits normal range of motion and no deformity.       Legs: Neurological: He is alert and oriented to person, place, and time. Gait normal.  Skin: Skin is warm and dry. There is erythema.       2cm lesion (see msk exam) c/w abscess.  Area is not fluctuant.      ED Course  Procedures (including critical care time)  Labs Reviewed - No data to display No results found.   1. Abscess       MDM  Pt to have infection rechecked in 2-3 days, sooner if he feels it is getting worse.  Reviewed sx to watch for. Area is not fluctuant, I do not see benefit of I&D at this  time.  Initially offered pt ibuprofen for pain, but pt is active cocaine user; I explained to pt cardiac risks of cocaine and that I could not rx NSAID for him.  Pt reports understanding.         Cathlyn Parsons, NP 03/10/12 1158  Cathlyn Parsons, NP 03/10/12 1159

## 2012-03-10 NOTE — ED Notes (Signed)
Noticed several bumps on body approx one week ago.  One on neck, chest, knee and hip.  Reports all have resolved but right knee.  Right knee is swollen, painful, red, visible red streak traveling up right thigh.  Denies fever .  Released from jail Friday night

## 2012-03-11 ENCOUNTER — Encounter (HOSPITAL_COMMUNITY): Payer: Self-pay

## 2012-03-11 ENCOUNTER — Emergency Department (HOSPITAL_COMMUNITY)
Admission: EM | Admit: 2012-03-11 | Discharge: 2012-03-11 | Disposition: A | Discharge status: Home / Self Care | Payer: Self-pay | Attending: Emergency Medicine | Admitting: Emergency Medicine

## 2012-03-11 DIAGNOSIS — F17200 Nicotine dependence, unspecified, uncomplicated: Secondary | ICD-10-CM | POA: Insufficient documentation

## 2012-03-11 DIAGNOSIS — L089 Local infection of the skin and subcutaneous tissue, unspecified: Secondary | ICD-10-CM | POA: Insufficient documentation

## 2012-03-11 LAB — BASIC METABOLIC PANEL
BUN: 12 mg/dL (ref 6–23)
CO2: 24 mEq/L (ref 19–32)
Glucose, Bld: 97 mg/dL (ref 70–99)
Potassium: 4.6 mEq/L (ref 3.5–5.1)
Sodium: 141 mEq/L (ref 135–145)

## 2012-03-11 LAB — CBC WITH DIFFERENTIAL/PLATELET
Eosinophils Relative: 6 % — ABNORMAL HIGH (ref 0–5)
Hemoglobin: 13.5 g/dL (ref 13.0–17.0)
Lymphocytes Relative: 28 % (ref 12–46)
Lymphs Abs: 2.4 10*3/uL (ref 0.7–4.0)
MCV: 92.8 fL (ref 78.0–100.0)
Monocytes Relative: 10 % (ref 3–12)
Neutrophils Relative %: 57 % (ref 43–77)
Platelets: 156 10*3/uL (ref 150–400)
RBC: 4.14 MIL/uL — ABNORMAL LOW (ref 4.22–5.81)
WBC: 8.6 10*3/uL (ref 4.0–10.5)

## 2012-03-11 MED ORDER — HYDROMORPHONE HCL PF 1 MG/ML IJ SOLN
1.0000 mg | Freq: Once | INTRAMUSCULAR | Status: AC
Start: 2012-03-11 — End: 2012-03-11
  Administered 2012-03-11: 1 mg via INTRAVENOUS
  Filled 2012-03-11: qty 1

## 2012-03-11 MED ORDER — DEXTROSE 5 % IV SOLN
1.0000 g | Freq: Once | INTRAVENOUS | Status: AC
  Administered 2012-03-11: 1 g via INTRAVENOUS

## 2012-03-11 MED ORDER — ONDANSETRON HCL 4 MG/2ML IJ SOLN
4.0000 mg | Freq: Once | INTRAMUSCULAR | Status: AC
  Administered 2012-03-11: 4 mg via INTRAVENOUS
  Filled 2012-03-11: qty 2

## 2012-03-11 MED ORDER — DEXTROSE 5 % IV SOLN
INTRAVENOUS | Status: AC
  Filled 2012-03-11: qty 10

## 2012-03-11 MED ORDER — SODIUM CHLORIDE 0.9 % IV SOLN
INTRAVENOUS | Status: DC
  Administered 2012-03-11: 17:00:00 via INTRAVENOUS

## 2012-03-11 MED ORDER — OXYCODONE-ACETAMINOPHEN 5-325 MG PO TABS: 1.0000 | ORAL_TABLET | ORAL | Status: AC | PRN

## 2012-03-11 NOTE — ED Notes (Signed)
Patient with wound on right knee, patient states drainage from knee this am, patient seen at [facility] and prescribed antibiotic but was unable to afford, patient states pain is increasing, knee with redness around wound encompassing knee

## 2012-03-11 NOTE — ED Notes (Signed)
Pt reports a red swollen area to (R) inner knee x3 weeks, pt seen at [facility] yesterday and prescribed ABX, pt here today w/increase pain, reports he did not have his ABX filled yesterday d/t not having money until Thursday. Redness, swelling, and heat noted to area. Pt reports hx of the same to his buttocks, pt reports he was just released from prison and was informed while he was there "staph was running around in the jail."

## 2012-03-11 NOTE — ED Provider Notes (Signed)
Medical screening examination/treatment/procedure(s) were performed by resident physician or non-physician practitioner and as supervising physician I was immediately available for consultation/collaboration.   Kaston Faughn DOUGLAS MD.    Jaidan Stachnik D Darwin Rothlisberger, MD 03/11/12 2028 

## 2012-03-11 NOTE — ED Notes (Signed)
Pt reports his mother will loan him the money for his prescriptions, pt does not wish to wait for care management, EDP Effie Shy informed

## 2012-03-11 NOTE — ED Provider Notes (Signed)
History    This chart was scribed for Flint Melter, MD, MD by Smitty Pluck. The patient was seen in room TR05C and the patient's care was started at 3:39PM.   CSN: [id number]  Arrival date & time 03/11/12  1439   None     Chief Complaint  Patient presents with  . Wound Infection    (Consider location/radiation/quality/duration/timing/severity/associated sxs/prior treatment) The history is provided by the patient.   Henry Stanley is a 36 y.o. male who presents to the Emergency Department complaining of moderate right knee swelling and wound infection onset 3 weeks ago. Pt reports that it started off as a boil and he tried to pop it. Pt reports blood drainage, numbness, burning and itching of the area. Pt reports having multiple sites of infection but others went away except the current one on the knee. Symptoms have been constant.   History reviewed. No pertinent past medical history.  Past Surgical History  Procedure Date  . Femur surgery     History reviewed. No pertinent family history.  History  Substance Use Topics  . Smoking status: Current Everyday Smoker -- 0.5 packs/day  . Smokeless tobacco: Not on file  . Alcohol Use: Yes     rare      Review of Systems  Constitutional: Negative for fever and chills.  Respiratory: Negative for cough and shortness of breath.   Gastrointestinal: Negative for nausea, vomiting and diarrhea.  Skin: Positive for wound.  Neurological: Positive for numbness.  All other systems reviewed and are negative.  10 Systems reviewed and all are negative for acute change except as noted in the HPI.    Allergies  Review of patient's allergies indicates no known allergies.  Home Medications   Current Outpatient Rx  Name Route Sig Dispense Refill  . OXYCODONE-ACETAMINOPHEN 5-325 MG PO TABS Oral Take 1 tablet by mouth every 4 (four) hours as needed for pain. 15 tablet 0    BP 109/68  Pulse 82  Temp 97.7 F (36.5 C) (Oral)  Resp  20  SpO2 99%  Physical Exam  Nursing note and vitals reviewed. Constitutional: He is oriented to person, place, and time. He appears well-developed and well-nourished. No distress.  HENT:  Head: Normocephalic and atraumatic.  Eyes: Conjunctivae are normal. Pupils are equal, round, and reactive to light.  Neck: Normal range of motion. Neck supple.  Cardiovascular: Normal rate, regular rhythm and normal heart sounds.   Pulmonary/Chest: Effort normal. No respiratory distress.  Neurological: He is alert and oriented to person, place, and time.  Skin: Skin is warm and dry.       Right medial knee is diffusely swollen with redness. There is localized tenderness with induration. Recent drainage with some blood. There is no active bleeding. no proximal streaking.    Psychiatric: He has a normal mood and affect. His behavior is normal.    ED Course  Procedures (including critical care time) DIAGNOSTIC STUDIES: Oxygen Saturation is 100% on room air, normal by my interpretation.    COORDINATION OF CARE: 3:48PM EDP discusses pt ED treatment with pt.  4:00PM EDP ordered medication: Rocephin, 0.9% NaCl, Dilaudid 1 mg, Zofran 4 mg  Labs Reviewed  CBC WITH DIFFERENTIAL - Abnormal; Notable for the following:    RBC 4.14 (*)     HCT 38.4 (*)     Eosinophils Relative 6 (*)     All other components within normal limits  BASIC METABOLIC PANEL  LAB REPORT - SCANNED  No results found.   1. Wound infection       MDM  Eval. C/w infected bursitis, without cellulitis or joint infection. Essentially, this was incited a local furuncle by squeezing it, leading to a deeper infection, int the bursa. It is expected that diligent heat treatments and ABX by mouh, and topical, will improve/resolve the condition. Doubt metabolic instability, serious bacterial infection or impending vascular collapse; the patient is stable for discharge.  Plan: Home Medications- Septra, Bactroban- heat applications.  PCP  prn     I personally performed the services described in this documentation, which was scribed in my presence. The recorded information has been reviewed and considered.      Flint Melter, MD 03/12/12 1116

## 2012-03-11 NOTE — Discharge Instructions (Signed)
Use heat on the sore area for 30 minutes 4 or 5 times a day. Clean the sore area daily with soap and water. See the Dr. of your choice for problems.  Wound Infection A wound infection happens when a type of germ (bacteria) grows in a wound. Caring for the infection can help the wound heal. Wound infections need treatment. HOME CARE   Only take medicine as told by your doctor.   Take your antibiotic medicine as told. Finish it even if you start to feel better.   Clean the wound with mild soap and water as told. Rinse the soap off. Pat the area dry with a clean towel. Do not rub the wound.   Change any bandages (dressings) as told by your doctor.   Put cream and a bandage on the wound as told by your doctor.   If the bandage sticks, wet it with soapy water to remove the bandage.   Change the bandage if it gets wet, dirty, or starts to smell.   Take showers. Do not take baths, swim, or do anything that puts your wound under water.   Avoid exercise that makes you sweat.   If your wound itches, use a medicine that helps stop itching. Do not pick or scratch at the wound.   Keep all doctor visits as told.  GET HELP RIGHT AWAY IF:   You have more puffiness (swelling), pain, or redness around the wound.   You have more yellowish-white fluid (pus) coming from the wound.   You have a bad smell coming from the wound.   Your wound breaks open more.   You have a fever.  MAKE SURE YOU:   Understand these instructions.   Will watch your condition.   Will get help right away if you are not doing well or get worse.  Document Released: 06/06/2008 Document Revised: 08/17/2011 Document Reviewed: 02/06/2011 [facility] Patient Information 2012 [place], [place].  RESOURCE GUIDE  Chronic Pain Problems: Contact [facility]  [phone] Patients need to be referred by their primary care doctor.  Insufficient Money for Medicine: Contact United Way:  call "211" or [facility] [phone].  No Primary Care Doctor: - Call [facility] Connect  [phone] - can help you locate a primary care doctor that  accepts your insurance, provides certain services, etc. - Physician Referral [phone]  Agencies that provide inexpensive medical care: - [facility]  [phone] - [facility]  [phone] - [facility]  [phone] [facility]  [phone] - Planned Parenthood  [phone] [facility]  [phone]  Medicaid-accepting [facility] Providers: - [facility]- [address]  [phone], Mon-Fri 9am-7pm, Sat 9am-1pm - [facility]- [address] [place], [address] [place]  [phone] - [facility]- [address] [place]  [phone] [facility]- [address]  [phone] - Renaye Rakers- [address] [place], [address], [phone]  Only accepts [place] patients after they have their name  applied to their card  Self Pay (no insurance) in [place]: - Sickle Cell Patients: Dr Willey Blade, [facility]  [address] [place], [phone] - [facility]- [address] [place]  [phone]       [facility] [place]- [address]       -  [facility]- see information above (Speak to [employer] if you do not have insurance)       -  [facility]- [address] [place], [phone]       -  [facility]- [address],  [phone]       -  [facility]- [address] [phone]       -  Dr Julio Sicks-  [address], [place], [phone]       -  [facility]- [address] [phone]       -  [facility]- [address] [phone], also [address] [phone]       -    [facility]- [address] [place], [phone], 1st & 3rd Saturday   every month,  10am-1pm  1) Find a Doctor and Pay Out of Pocket Although you won't have to find out who is covered by your insurance plan, it is a good idea to ask around and get recommendations. You will then need to call the office and see if the doctor you have chosen will accept you as a new patient and what types of options they offer for patients who are self-pay. Some doctors offer discounts or will set up payment plans for their patients who do not have insurance, but you will need to ask so you aren't surprised when you get to your appointment.  2) Contact Your Local Health Department Not all health departments have doctors that can see patients for sick visits, but many do, so it is worth a call to see if yours does. If you don't know where your local health department is, you can check in your phone book. The CDC also has a tool to help you locate your state's health department, and many state websites also have listings of all of their local health departments.  3) Find a Walk-in Clinic If your illness is not likely to be very severe or complicated, you may want to try a walk in clinic. These are popping up all over the country in pharmacies, drugstores, and shopping centers. They're usually staffed by nurse practitioners or physician assistants that have been trained to treat common illnesses and complaints. They're usually fairly quick and inexpensive. However, if you have serious medical issues or chronic medical problems, these are probably not your best option  STD Testing - [facility] Department of [facility], STD Clinic, [address] [place], phone [phone] or [phone].  Monday - Friday, call for an appointment. [facility] Department of [employer], STD Clinic, [place] [address], [place], phone [phone] or [phone].  Monday - Friday, call for an appointment.  Abuse/Neglect: [facility] Child Abuse Hotline (346) 130-0149 [facility] Child Abuse Hotline (202) 761-1084 (After Hours)  Emergency Shelter:  [facility] (671) 267-8661  Maternity Homes: - [facility] [phone] - [facility] (217)[phone]  MRSA Hotline #:   772-159-0812  [facility] Resources  [facility] [place]  [employer] [facility] Dept. [address]                 [address]         [address]  [address]  Sela Hua Phone:  [phone]                                  Phone:  [phone]                   Phone:  [place], [place] in [place], [address],                                  [place] [phone] or [phone] (After Hours)   [place]  Substance Abuse Resources: - [facility] and Drug Services  (718) [phone] - [place] [phone] - [facility] [place] 947-069-4579 Chinita Pester (604) [phone] - Residential & Outpatient Substance Abuse Program  [phone]  Psychological Services: - [place]  [place], (908) [phone] [place]. [address] [place], [place]: [phone] or [phone], [employer]  Dental Assistance  If unable to pay or uninsured, contact:  [facility] or [facility]. to become qualified for the adult dental clinic.  Patients with Medicaid: [facility] [phone] [address], [place] [address] [phone]  If unable to pay, or uninsured, contact [facility] [phone]) or [place] [phone] in [place], [place] in [facility]) to become qualified for the adult dental clinic  Other [place]- [place], [place], [place], [address], [place], [place], 2nd and 4th Thursday of the month at 6:30am.  10 clients each day by appointment, can sometimes see walk-in patients if someone does not show for an appointment. [facility]- [address] Hillard Danker [place], [place], [address], [place], [place], [place], [address], [place] Department- [place] Department- [place] Department- [phone]

## 2012-03-11 NOTE — ED Notes (Signed)
Rt. Knee swelling for x 2 weeks. Went to ucc on Sunday and prescribed antibiotics and did not get filled b/c couldn't t afford.

## 2012-12-15 ENCOUNTER — Observation Stay (HOSPITAL_BASED_OUTPATIENT_CLINIC_OR_DEPARTMENT_OTHER)
Admission: EM | Admit: 2012-12-15 | Discharge: 2012-12-16 | Disposition: A | Discharge status: Other Acute Inpatient Hospital | Payer: Self-pay | Source: Home / Self Care | Attending: Emergency Medicine | Admitting: Emergency Medicine

## 2012-12-15 ENCOUNTER — Emergency Department (HOSPITAL_COMMUNITY): Payer: Self-pay

## 2012-12-15 ENCOUNTER — Other Ambulatory Visit: Payer: Self-pay

## 2012-12-15 DIAGNOSIS — J9600 Acute respiratory failure, unspecified whether with hypoxia or hypercapnia: Secondary | ICD-10-CM

## 2012-12-15 DIAGNOSIS — F329 Major depressive disorder, single episode, unspecified: Secondary | ICD-10-CM | POA: Diagnosis present

## 2012-12-15 DIAGNOSIS — E876 Hypokalemia: Secondary | ICD-10-CM

## 2012-12-15 DIAGNOSIS — F3289 Other specified depressive episodes: Secondary | ICD-10-CM | POA: Diagnosis present

## 2012-12-15 DIAGNOSIS — R4182 Altered mental status, unspecified: Secondary | ICD-10-CM | POA: Diagnosis present

## 2012-12-15 DIAGNOSIS — F1410 Cocaine abuse, uncomplicated: Secondary | ICD-10-CM | POA: Diagnosis present

## 2012-12-15 DIAGNOSIS — K219 Gastro-esophageal reflux disease without esophagitis: Secondary | ICD-10-CM | POA: Diagnosis present

## 2012-12-15 DIAGNOSIS — R569 Unspecified convulsions: Secondary | ICD-10-CM

## 2012-12-15 DIAGNOSIS — T43601A Poisoning by unspecified psychostimulants, accidental (unintentional), initial encounter: Secondary | ICD-10-CM | POA: Diagnosis present

## 2012-12-15 DIAGNOSIS — F17200 Nicotine dependence, unspecified, uncomplicated: Secondary | ICD-10-CM | POA: Diagnosis present

## 2012-12-15 DIAGNOSIS — T405X1A Poisoning by cocaine, accidental (unintentional), initial encounter: Secondary | ICD-10-CM | POA: Diagnosis present

## 2012-12-15 HISTORY — DX: Other specified health status: Z78.9

## 2012-12-15 LAB — COMPREHENSIVE METABOLIC PANEL
ALT: 9 U/L (ref 0–53)
Alkaline Phosphatase: 68 U/L (ref 39–117)
BUN: 10 mg/dL (ref 6–23)
CO2: 22 mEq/L (ref 19–32)
Calcium: 8.9 mg/dL (ref 8.4–10.5)
GFR calc Af Amer: 89 mL/min — ABNORMAL LOW (ref >90)
GFR calc non Af Amer: 77 mL/min — ABNORMAL LOW (ref >90)
Glucose, Bld: 149 mg/dL — ABNORMAL HIGH (ref 70–99)
Total Protein: 6.8 g/dL (ref 6.0–8.3)

## 2012-12-15 LAB — CBC WITH DIFFERENTIAL/PLATELET
Basophils Absolute: 0 10*3/uL (ref 0.0–0.1)
Eosinophils Absolute: 0.2 10*3/uL (ref 0.0–0.7)
Eosinophils Relative: 1 % (ref 0–5)
HCT: 38.1 % — ABNORMAL LOW (ref 39.0–52.0)
MCH: 32.8 pg (ref 26.0–34.0)
MCHC: 36 g/dL (ref 30.0–36.0)
MCV: 91.1 fL (ref 78.0–100.0)
Monocytes Absolute: 0.9 10*3/uL (ref 0.1–1.0)
Platelets: 137 10*3/uL — ABNORMAL LOW (ref 150–400)
RDW: 12.7 % (ref 11.5–15.5)

## 2012-12-15 LAB — ACETAMINOPHEN LEVEL: Acetaminophen (Tylenol), Serum: <15 ug/mL (ref 10–30)

## 2012-12-15 LAB — POCT I-STAT, CHEM 8
BUN: 8 mg/dL (ref 6–23)
Calcium, Ion: 1.17 mmol/L (ref 1.12–1.23)
Creatinine, Ser: 1.1 mg/dL (ref 0.50–1.35)
Hemoglobin: 14.3 g/dL (ref 13.0–17.0)
Sodium: 145 mEq/L (ref 135–145)
TCO2: 21 mmol/L (ref 0–100)

## 2012-12-15 LAB — POCT I-STAT TROPONIN I

## 2012-12-15 LAB — APTT: aPTT: 24 seconds (ref 24–37)

## 2012-12-15 MED ORDER — ROCURONIUM BROMIDE 50 MG/5ML IV SOLN
INTRAVENOUS | Status: AC
  Administered 2012-12-15: 100 mg
  Filled 2012-12-15: qty 2

## 2012-12-15 MED ORDER — LORAZEPAM 2 MG/ML IJ SOLN: 2.0000 mg | Freq: Once | INTRAMUSCULAR | Status: DC

## 2012-12-15 MED ORDER — LIDOCAINE HCL (CARDIAC) 20 MG/ML IV SOLN
INTRAVENOUS | Status: AC
  Filled 2012-12-15: qty 5

## 2012-12-15 MED ORDER — MIDAZOLAM HCL 5 MG/ML IJ SOLN
1.0000 mg/h | INTRAMUSCULAR | Status: DC
  Administered 2012-12-15: 2 mg/h via INTRAVENOUS
  Filled 2012-12-15: qty 10

## 2012-12-15 MED ORDER — LORAZEPAM 2 MG/ML IJ SOLN
INTRAMUSCULAR | Status: AC
  Administered 2012-12-15: 6 mg via INTRAVENOUS
  Filled 2012-12-15: qty 3

## 2012-12-15 MED ORDER — LORAZEPAM 2 MG/ML IJ SOLN
INTRAMUSCULAR | Status: AC
  Filled 2012-12-15: qty 2

## 2012-12-15 MED ORDER — FENTANYL CITRATE 0.05 MG/ML IJ SOLN
INTRAMUSCULAR | Status: AC
  Administered 2012-12-15: 100 ug
  Filled 2012-12-15: qty 2

## 2012-12-15 MED ORDER — SUCCINYLCHOLINE CHLORIDE 20 MG/ML IJ SOLN
INTRAMUSCULAR | Status: AC
  Filled 2012-12-15: qty 1

## 2012-12-15 MED ORDER — FENTANYL CITRATE 0.05 MG/ML IJ SOLN
50.0000 ug/h | INTRAMUSCULAR | Status: DC
  Administered 2012-12-15: 50 ug/h via INTRAVENOUS
  Filled 2012-12-15: qty 50

## 2012-12-15 MED ORDER — SODIUM BICARBONATE 8.4 % IV SOLN
INTRAVENOUS | Status: AC
  Filled 2012-12-15: qty 100

## 2012-12-15 MED ORDER — MIDAZOLAM HCL 2 MG/2ML IJ SOLN
INTRAMUSCULAR | Status: AC
  Administered 2012-12-15: 2 mg
  Filled 2012-12-15: qty 2

## 2012-12-15 MED ORDER — SODIUM CHLORIDE 0.9 % IV SOLN
Freq: Once | INTRAVENOUS | Status: DC
  Administered 2012-12-15: 23:00:00 via INTRAVENOUS

## 2012-12-15 MED ORDER — ETOMIDATE 2 MG/ML IV SOLN
INTRAVENOUS | Status: AC
  Administered 2012-12-15: 10 mg
  Filled 2012-12-15: qty 20

## 2012-12-15 MED ORDER — SODIUM BICARBONATE 8.4 % IV SOLN
50.0000 meq | Freq: Once | INTRAVENOUS | Status: DC
  Administered 2012-12-15: 50 meq via INTRAVENOUS

## 2012-12-15 MED ORDER — SODIUM BICARBONATE 8.4 % IV SOLN
50.0000 meq | Freq: Once | INTRAVENOUS | Status: DC
  Administered 2012-12-15: 22:00:00 via INTRAVENOUS

## 2012-12-15 MED ORDER — LORAZEPAM 2 MG/ML IJ SOLN
4.0000 mg | Freq: Once | INTRAMUSCULAR | Status: DC
  Administered 2012-12-15: 6 mg via INTRAVENOUS
  Administered 2012-12-15: 23:00:00 via INTRAVENOUS

## 2012-12-15 NOTE — ED Notes (Signed)
PT. REPORTS PALPITATIONS THIS EVENING AFTER TAKING COCAINE , AGITATED / ANXIOUS AT ARRIVAL.

## 2012-12-15 NOTE — ED Notes (Signed)
MD at bedside. 

## 2012-12-15 NOTE — ED Provider Notes (Addendum)
History     CSN: [id number]  Arrival date & time 12/15/12  2315   First MD Initiated Contact with Patient 12/15/12 2226      Chief Complaint  Patient presents with  . Palpitations    (Consider location/radiation/quality/duration/timing/severity/associated sxs/prior treatment) HPI Comments: Level V caveat apply secondary to altered mental status  The patient was brought into the hospital by family members who states that he overdosed on cocaine. The patient became agitated, diaphoretic and stated he had palpitations, he was unable to contribute to his history secondary to severe altered mental status on arrival.  Patient is a 37 y.o. male presenting with palpitations.  Palpitations     No past medical history on file.  Past Surgical History  Procedure Laterality Date  . Femur surgery      No family history on file.  History  Substance Use Topics  . Smoking status: Current Every Day Smoker -- 0.50 packs/day  . Smokeless tobacco: Not on file  . Alcohol Use: Yes     Comment: rare      Review of Systems  Unable to perform ROS: Mental status change  Cardiovascular: Positive for palpitations.    Allergies  Review of patient's allergies indicates no known allergies.  Home Medications   Current Outpatient Rx  Name  Route  Sig  Dispense  Refill  . omeprazole (PRILOSEC OTC) 20 MG tablet   Oral   Take 1 tablet (20 mg total) by mouth daily.   30 tablet   0     BP 149/96  Pulse 114  Temp(Src) 97.6 F (36.4 C) (Rectal)  Resp 20  SpO2 100%  Physical Exam  Nursing note and vitals reviewed. Constitutional: He appears well-developed and well-nourished. He appears distressed.  HENT:  Head: Normocephalic and atraumatic.  Mouth/Throat: Oropharynx is clear and moist. No oropharyngeal exudate.  Eyes: Conjunctivae and EOM are normal. Pupils are equal, round, and reactive to light. Right eye exhibits no discharge. Left eye exhibits no discharge. No scleral icterus.   Pupils 5 mm and reactive bilaterally  Neck: Normal range of motion. Neck supple. No JVD present. No thyromegaly present.  Cardiovascular: Regular rhythm, normal heart sounds and intact distal pulses.  Exam reveals no gallop and no friction rub.   No murmur heard. Severe tachycardia to 150 beats per minute, palpable pulses at the radial arteries  Pulmonary/Chest: Effort normal and breath sounds normal. No respiratory distress. He has no wheezes. He has no rales.  Abdominal: Soft. Bowel sounds are normal. He exhibits no distension and no mass. There is no tenderness.  Musculoskeletal: Normal range of motion. He exhibits no edema and no tenderness.  No signs of deformity, bruising, contusion, laceration or other signs of trauma  Lymphadenopathy:    He has no cervical adenopathy.  Neurological:  Patient with mild tremor, awake, alert, not answering questions, extremely agitated, combative, moving all extremities with normal strength  Skin: Skin is warm. No rash noted. He is diaphoretic. No erythema.  Psychiatric: He has a normal mood and affect. His behavior is normal.    ED Course  Procedures (including critical care time)  Labs Reviewed  CBC WITH DIFFERENTIAL - Abnormal; Notable for the following:    RBC 4.18 (*)    HCT 38.1 (*)    Platelets 137 (*)    Neutrophils Relative % 82 (*)    Neutro Abs 8.5 (*)    Lymphocytes Relative 9 (*)    All other components within normal limits  COMPREHENSIVE METABOLIC PANEL - Abnormal; Notable for the following:    Potassium 3.1 (*)    Glucose, Bld 149 (*)    GFR calc non Af Amer 77 (*)    GFR calc Af Amer 89 (*)    All other components within normal limits  SALICYLATE LEVEL - Abnormal; Notable for the following:    Salicylate Lvl <2.0 (*)    All other components within normal limits  POCT I-STAT, CHEM 8 - Abnormal; Notable for the following:    Potassium 3.1 (*)    Glucose, Bld 150 (*)    All other components within normal limits  APTT   PROTIME-INR  ACETAMINOPHEN LEVEL  CK  POCT I-STAT TROPONIN I   No results found.   1. Cocaine overdose, initial encounter   2. Seizure   3. Hypokalemia   4. Acute respiratory failure       MDM  The patient appears acutely ill, likely related to sympathomimetic overdose, patient admits to cocaine, 4 g, has a classic toxidrome of tachycardiac, hypertension, diaphoresis and then proceeded to have a seizure which was tonic-clonic in nature, lasted less than 2 minutes, the patient was postictal and Requiring bag-valve-mask ventilation and intubation. The patient received 10 mg of IV Ativan over his first 5 minutes in the emergency department and continued to have severe agitation.  Initial EKG showed sinus tachycardia with a prolonged QT of greater than 580 ms, to have some sodium bicarbonate were given, Ativan given  ED ECG REPORT  I personally interpreted this EKG   Date: 01/22/2013   Rate: 132  Rhythm: sinus tachycardia  QRS Axis: normal  Intervals: QT prolonged  ST/T Wave abnormalities: nonspecific ST/T changes  Conduction Disutrbances:none  Narrative Interpretation:   Old EKG Reviewed: none available  INTUBATION Performed by: Vida Roller  Required items: required blood products, implants, devices, and special equipment available Patient identity confirmed: provided demographic data and hospital-assigned identification number Time out: Immediately prior to procedure a "time out" was called to verify the correct patient, procedure, equipment, support staff and site/side marked as required.  Indications: Seizure, OD  Intubation method: Direct Laryngoscopy   Preoxygenation: BVM  Sedatives: 10Etomidate, 6mg  Ativan Paralytic: 100mg  Rocuronium  Tube Size:  7.5 cuffed  Post-procedure assessment: chest rise and ETCO2 monitor Breath sounds: equal and absent over the epigastrium Tube secured with: ETT holder Chest x-ray interpreted by radiologist and me.  Chest x-ray  findings: endotracheal tube in appropriate position  Patient tolerated the procedure well with no immediate complications.  Patient is now on a Versed drip, heart rate is coming down to 115,  CT scan of the head is been evaluated by myself as well as radiology, no signs of hemorrhage, injury, mass or shift. Chest x-ray shows tube in the appropriate position without any pulmonary abnormalities, overall the lab work is unremarkable other than some mild hypokalemia. Creatine kinase pending.  Discussed care with critical care intensivist Dr. Darrick Penna who will send critical care physician to admit the patient to the intensive.    D/w Girlfriend who has now arrived and states that he told her that he has swallowed "a bunch of stuff" and "I'm going to die".  This was after they had broken up the night before.  She denies that he has a history of SI or depression.   KUB ordered, d/w toxicology - states that Activated Charcoal is indicated, no whole bowel irrigation though.   Recommend core temperature, benzos as needed.    CRITICAL  CARE Performed by: Vida Roller   Total critical care time: 50  Critical care time was exclusive of separately billable procedures and treating other patients.  Critical care was necessary to treat or prevent imminent or life-threatening deterioration.  Critical care was time spent personally by me on the following activities: development of treatment plan with patient and/or surrogate as well as nursing, discussions with consultants, evaluation of patient's response to treatment, examination of patient, obtaining history from patient or surrogate, ordering and performing treatments and interventions, ordering and review of laboratory studies, ordering and review of radiographic studies, pulse oximetry and re-evaluation of patient's condition.  If bag of cocaine, no indiation for WBI, if swallowed raw drug, quick absorption and AC would possibly be indicated per [facility]  center.  AC ordered 25g.           Vida Roller, MD 12/16/12 0010  Vida Roller, MD 01/22/13 2256

## 2012-12-15 NOTE — ED Notes (Signed)
Patient seizing at this time, unable to maintain airway.  MD gave verbal orders to intubate.

## 2012-12-15 NOTE — ED Notes (Signed)
Patient very agitated, mumbling short sentences and combative.  [employer] paged, MD at bedside.

## 2012-12-16 ENCOUNTER — Inpatient Hospital Stay (HOSPITAL_COMMUNITY): Payer: Self-pay

## 2012-12-16 ENCOUNTER — Observation Stay (HOSPITAL_COMMUNITY): Payer: Self-pay

## 2012-12-16 ENCOUNTER — Encounter (HOSPITAL_COMMUNITY): Payer: Self-pay | Admitting: *Deleted

## 2012-12-16 ENCOUNTER — Inpatient Hospital Stay (HOSPITAL_COMMUNITY)
Admission: EM | Admit: 2012-12-16 | Discharge: 2012-12-17 | DRG: 208 | Disposition: A | Discharge status: Home / Self Care | Payer: MEDICAID | Attending: Internal Medicine | Admitting: Internal Medicine

## 2012-12-16 DIAGNOSIS — F329 Major depressive disorder, single episode, unspecified: Secondary | ICD-10-CM

## 2012-12-16 DIAGNOSIS — T405X1A Poisoning by cocaine, accidental (unintentional), initial encounter: Secondary | ICD-10-CM

## 2012-12-16 DIAGNOSIS — F1410 Cocaine abuse, uncomplicated: Secondary | ICD-10-CM

## 2012-12-16 DIAGNOSIS — F32A Depression, unspecified: Secondary | ICD-10-CM | POA: Diagnosis present

## 2012-12-16 DIAGNOSIS — T43601A Poisoning by unspecified psychostimulants, accidental (unintentional), initial encounter: Secondary | ICD-10-CM

## 2012-12-16 DIAGNOSIS — J9600 Acute respiratory failure, unspecified whether with hypoxia or hypercapnia: Secondary | ICD-10-CM

## 2012-12-16 DIAGNOSIS — K611 Rectal abscess: Secondary | ICD-10-CM

## 2012-12-16 DIAGNOSIS — R569 Unspecified convulsions: Secondary | ICD-10-CM

## 2012-12-16 LAB — BLOOD GAS, ARTERIAL
Drawn by: 34779
MECHVT: 530 mL
O2 Saturation: 100 %
PEEP: 5 cmH2O
Patient temperature: 98.6
RATE: 18 resp/min
pO2, Arterial: 524 mmHg — ABNORMAL HIGH (ref 80.0–100.0)

## 2012-12-16 LAB — URINALYSIS, ROUTINE W REFLEX MICROSCOPIC
Glucose, UA: NEGATIVE mg/dL
Leukocytes, UA: NEGATIVE
Specific Gravity, Urine: 1.028 (ref 1.005–1.030)
pH: 7.5 (ref 5.0–8.0)

## 2012-12-16 LAB — GLUCOSE, CAPILLARY
Glucose-Capillary: 132 mg/dL — ABNORMAL HIGH (ref 70–99)
Glucose-Capillary: 107 mg/dL — ABNORMAL HIGH (ref 70–99)
Glucose-Capillary: 103 mg/dL — ABNORMAL HIGH (ref 70–99)

## 2012-12-16 LAB — URINE MICROSCOPIC-ADD ON

## 2012-12-16 LAB — RAPID URINE DRUG SCREEN, HOSP PERFORMED
Barbiturates: NOT DETECTED
Cocaine: POSITIVE — AB
Tetrahydrocannabinol: POSITIVE — AB

## 2012-12-16 LAB — BASIC METABOLIC PANEL
GFR calc non Af Amer: >90 mL/min (ref >90)
Glucose, Bld: 116 mg/dL — ABNORMAL HIGH (ref 70–99)
Potassium: 4 mEq/L (ref 3.5–5.1)
Sodium: 140 mEq/L (ref 135–145)

## 2012-12-16 LAB — CREATININE, SERUM
Creatinine, Ser: 0.95 mg/dL (ref 0.50–1.35)
GFR calc Af Amer: >90 mL/min (ref >90)

## 2012-12-16 LAB — CBC
Hemoglobin: 13.4 g/dL (ref 13.0–17.0)
MCH: 32 pg (ref 26.0–34.0)
MCV: 89.7 fL (ref 78.0–100.0)
RBC: 4.19 MIL/uL — ABNORMAL LOW (ref 4.22–5.81)
WBC: 9.2 10*3/uL (ref 4.0–10.5)

## 2012-12-16 LAB — PHOSPHORUS: Phosphorus: 1.6 mg/dL — ABNORMAL LOW (ref 2.3–4.6)

## 2012-12-16 LAB — MAGNESIUM: Magnesium: 2.2 mg/dL (ref 1.5–2.5)

## 2012-12-16 MED ORDER — FAMOTIDINE IN NACL 20-0.9 MG/50ML-% IV SOLN: 20.0000 mg | Freq: Two times a day (BID) | INTRAVENOUS | Status: DC

## 2012-12-16 MED ORDER — SODIUM CHLORIDE 0.9 % IV SOLN: 1.0000 mg/h | INTRAVENOUS | Status: DC

## 2012-12-16 MED ORDER — ALBUTEROL SULFATE HFA 108 (90 BASE) MCG/ACT IN AERS
2.0000 | INHALATION_SPRAY | RESPIRATORY_TRACT | Status: DC
  Administered 2012-12-16: 2 via RESPIRATORY_TRACT

## 2012-12-16 MED ORDER — ALBUTEROL SULFATE HFA 108 (90 BASE) MCG/ACT IN AERS: 4.0000 | INHALATION_SPRAY | RESPIRATORY_TRACT | Status: DC

## 2012-12-16 MED ORDER — ASPIRIN 300 MG RE SUPP: 300.0000 mg | RECTAL | Status: DC

## 2012-12-16 MED ORDER — ALBUTEROL SULFATE HFA 108 (90 BASE) MCG/ACT IN AERS
4.0000 | INHALATION_SPRAY | RESPIRATORY_TRACT | Status: DC
  Administered 2012-12-16 (×3): 4 via RESPIRATORY_TRACT
  Filled 2012-12-16: qty 6.7

## 2012-12-16 MED ORDER — ASPIRIN 81 MG PO CHEW
324.0000 mg | CHEWABLE_TABLET | ORAL | Status: AC
  Administered 2012-12-16: 324 mg via ORAL
  Filled 2012-12-16: qty 4

## 2012-12-16 MED ORDER — FENTANYL BOLUS VIA INFUSION
25.0000 ug | Freq: Four times a day (QID) | INTRAVENOUS | Status: DC | PRN
  Filled 2012-12-16: qty 100

## 2012-12-16 MED ORDER — SODIUM CHLORIDE 0.9 % IV SOLN: 250.0000 mL | INTRAVENOUS | Status: DC | PRN

## 2012-12-16 MED ORDER — HEPARIN SODIUM (PORCINE) 5000 UNIT/ML IJ SOLN: 5000.0000 [IU] | Freq: Three times a day (TID) | INTRAMUSCULAR | Status: DC

## 2012-12-16 MED ORDER — FENTANYL BOLUS VIA INFUSION
25.0000 ug | Freq: Four times a day (QID) | INTRAVENOUS | Status: DC | PRN
  Administered 2012-12-16: 100 ug via INTRAVENOUS
  Filled 2012-12-16: qty 100

## 2012-12-16 MED ORDER — ASPIRIN 300 MG RE SUPP: 300.0000 mg | RECTAL | Status: AC

## 2012-12-16 MED ORDER — K PHOS MONO-SOD PHOS DI & MONO 155-852-130 MG PO TABS
250.0000 mg | ORAL_TABLET | Freq: Two times a day (BID) | ORAL | Status: DC
  Administered 2012-12-16: 250 mg via ORAL
  Filled 2012-12-16 (×2): qty 1

## 2012-12-16 MED ORDER — SODIUM CHLORIDE 0.9 % IV SOLN
1.0000 mg/h | INTRAVENOUS | Status: DC
  Administered 2012-12-16: 8 mg/h via INTRAVENOUS
  Filled 2012-12-16 (×2): qty 10

## 2012-12-16 MED ORDER — BIOTENE DRY MOUTH MT LIQD
15.0000 mL | Freq: Four times a day (QID) | OROMUCOSAL | Status: DC
  Administered 2012-12-16 (×2): 15 mL via OROMUCOSAL

## 2012-12-16 MED ORDER — ASPIRIN 81 MG PO CHEW: 324.0000 mg | CHEWABLE_TABLET | ORAL | Status: DC

## 2012-12-16 MED ORDER — CHLORHEXIDINE GLUCONATE 0.12 % MT SOLN
15.0000 mL | Freq: Two times a day (BID) | OROMUCOSAL | Status: DC
  Administered 2012-12-16: 15 mL via OROMUCOSAL
  Filled 2012-12-16 (×2): qty 15

## 2012-12-16 MED ORDER — SODIUM CHLORIDE 0.9 % IV SOLN
25.0000 ug/h | INTRAVENOUS | Status: DC
  Filled 2012-12-16: qty 50

## 2012-12-16 MED ORDER — HEPARIN SODIUM (PORCINE) 5000 UNIT/ML IJ SOLN
5000.0000 [IU] | Freq: Three times a day (TID) | INTRAMUSCULAR | Status: DC
  Administered 2012-12-16 – 2012-12-17 (×4): 5000 [IU] via SUBCUTANEOUS
  Filled 2012-12-16 (×7): qty 1

## 2012-12-16 MED ORDER — FAMOTIDINE IN NACL 20-0.9 MG/50ML-% IV SOLN
20.0000 mg | Freq: Two times a day (BID) | INTRAVENOUS | Status: DC
  Administered 2012-12-16 (×2): 20 mg via INTRAVENOUS
  Filled 2012-12-16 (×5): qty 50

## 2012-12-16 MED ORDER — MIDAZOLAM BOLUS VIA INFUSION
1.0000 mg | INTRAVENOUS | Status: DC | PRN
  Administered 2012-12-16: 2 mg via INTRAVENOUS
  Filled 2012-12-16: qty 2

## 2012-12-16 MED ORDER — INSULIN ASPART 100 UNIT/ML ~~LOC~~ SOLN: 0.0000 [IU] | SUBCUTANEOUS | Status: DC

## 2012-12-16 MED ORDER — CHARCOAL ACTIVATED PO LIQD
25.0000 g | Freq: Once | ORAL | Status: DC
  Administered 2012-12-16: 25 g via ORAL
  Filled 2012-12-16: qty 240

## 2012-12-16 MED ORDER — SODIUM CHLORIDE 0.9 % IV SOLN: 25.0000 ug/h | INTRAVENOUS | Status: DC

## 2012-12-16 MED ORDER — MIDAZOLAM BOLUS VIA INFUSION
1.0000 mg | INTRAVENOUS | Status: DC | PRN
  Filled 2012-12-16: qty 2

## 2012-12-16 NOTE — Progress Notes (Signed)
Chaplain responded to ED Pod A4 after receiving a request from Pt's nurse. Pt had drug overdose and was intubated. Chaplain escort family to pt's room and provided ministry pf presence until family left the hospital to bring pt's mother. Chaplain will follow-up as needed. Kelle Darting [phone]  12/16/12 0100  Clinical Encounter Type  Visited With Patient and family together  Visit Type Initial;Spiritual support;Critical Care;ED  Referral From Nurse  Spiritual Encounters  Spiritual Needs Emotional  Stress Factors  Patient Stress Factors None identified;Lack of knowledge  Family Stress Factors Other (Comment) (Upset)

## 2012-12-16 NOTE — Procedures (Signed)
Extubation Procedure Note  Patient Details:   Name: SALAH NAKAMURA DOB: 1975-11-07 MRN: [id number]   Airway Documentation:     Evaluation  O2 sats: stable throughout Complications: No apparent complications Patient did tolerate procedure well. Bilateral Breath Sounds: Diminished Suctioning: Airway Yes  Pt stable throughout and vitals WNL.  Devra Dopp D 12/16/2012, 1:27 PM

## 2012-12-16 NOTE — Progress Notes (Signed)
INITIAL NUTRITION ASSESSMENT  DOCUMENTATION CODES Per approved criteria  -Not Applicable   INTERVENTION:  Recommend initiate TF via NG tube with Jevity 1.2 at 25 ml/h, increase by 10 ml every 4 hours to goal rate of 55 ml/h with Prostat 30 ml BID to provide 1784 kcals, 103 gm protein, 1069 ml free water daily.  NUTRITION DIAGNOSIS: Inadequate oral intake related to inability to eat as evidenced by NPO status.   Goal: Intake to meet >90% of estimated nutrition needs.  Monitor:  TF tolerance/adequacy, weight trend, labs, vent status.  Reason for Assessment: MD Consult for assessment and TF recommendations; VDRF  37 y.o. male  Admitting Dx: S/P ingestion of about 4 gm of cocaine  ASSESSMENT: Patient [facility] ED after ingesting about 4g of cocaine. In the ED he became increasingly agitated then had an apparent seizure and was intubated for airway protection. Received consult for TF recommendations.  Patient is currently intubated on ventilator support.  MV: 7.9 Temp:Temp (24hrs), Avg:98.2 F (36.8 C), Min:98 F (36.7 C), Max:98.4 F (36.9 C)   Height: Ht Readings from Last 1 Encounters:  12/16/12 5\' 7"  (1.702 m)    Weight: Wt Readings from Last 1 Encounters:  12/16/12 162 lb 14.7 oz (73.9 kg)    Ideal Body Weight: 67.3 kg  % Ideal Body Weight: 110%  Wt Readings from Last 10 Encounters:  12/16/12 162 lb 14.7 oz (73.9 kg)  09/22/11 140 lb (63.504 kg)  08/13/11 160 lb (72.576 kg)    Usual Body Weight: 140-160 lb  % Usual Body Weight: 101%  BMI:  Body mass index is 25.51 kg/(m^2).  Estimated Nutritional Needs: Kcal: 1760 Protein: 90-110 gm Fluid: 1.7-2 L  Skin: no problems noted  Diet Order:  NPO  EDUCATION NEEDS: -Education not appropriate at this time   Intake/Output Summary (Last 24 hours) at 12/16/12 1023 Last data filed at 12/16/12 0900  Gross per 24 hour  Intake    288 ml  Output    350 ml  Net    -62 ml    Last BM:  none documented   Labs:   Recent Labs Lab 12/16/12 0430 12/16/12 0448  NA  --  140  K  --  4.0  CL  --  108  CO2  --  24  BUN  --  9  CREATININE 0.95 0.98  CALCIUM  --  8.9  MG  --  2.2  PHOS  --  1.6*  GLUCOSE  --  116*    CBG (last 3)   Recent Labs  12/16/12 0227 12/16/12 0342 12/16/12 0748  GLUCAP 132* 114* 107*    Scheduled Meds: . albuterol  4 puff Inhalation Q4H  . antiseptic oral rinse  15 mL Mouth Rinse QID  . chlorhexidine  15 mL Mouth Rinse BID  . famotidine (PEPCID) IV  20 mg Intravenous Q12H  . heparin  5,000 Units Subcutaneous Q8H  . insulin aspart  0-15 Units Subcutaneous Q4H  . phosphorus  250 mg Oral BID    Continuous Infusions: . fentaNYL infusion INTRAVENOUS Stopped (12/16/12 0800)  . midazolam (VERSED) infusion Stopped (12/16/12 0800)    No past medical history on file.  Past Surgical History  Procedure Laterality Date  . Femur surgery      Joaquin Courts, RD, LDN, CNSC Pager [phone] After Hours Pager [phone]

## 2012-12-16 NOTE — Progress Notes (Deleted)
Pt transfer from 2100 via bed. VSS. No c/o of pain. Will monitor and report off to oncoming shift.

## 2012-12-16 NOTE — Progress Notes (Signed)
The patient transferred from 2100. Awake and alert; currently NPO.   Allow ice chips.

## 2012-12-16 NOTE — Progress Notes (Signed)
PULMONARY  / CRITICAL CARE MEDICINE  Name: Henry Stanley MRN: [id number] DOB: 07/20/1976    ADMISSION DATE:  12/16/2012 CONSULTATION DATE:  12/16/12  REFERRING MD :  Dr. Hyacinth Meeker PRIMARY SERVICE: PCCM  CHIEF COMPLAINT:  Cocaine stuffing (~4g in sandwhich bag)  BRIEF PATIENT DESCRIPTION: Henry Stanley is a 37 y/o man with extensive hx of cocaine stuffing and polysubstance abuse who presented to the [facility] ED after ingesting about 4g of cocaine. In the ED he became increasingly agitated then had an apparent seizure and was intubated for airway protection. His girlfriend, who was at his bedside, is concerned that he may have swallowed something prior to this, although she is not sure what. She believes that he uses cocaine and marijuana regularly.    SIGNIFICANT EVENTS / STUDIES:  4/7-intubated  LINES / TUBES: PIV 4/7>> ETT 4/7 >>  CULTURES:  ANTIBIOTICS:  SUBJECTIVE: Pt intubated. Extensive chart review no diagnosis of ?schizophrenia found. But multiple admissions for cocaine stuffing and abscess I&D ?IVDU, ?intention  VITAL SIGNS: Temp:  [98.4 F (36.9 C)] 98.4 F (36.9 C) (04/07 0413) Pulse Rate:  [91-106] 94 (04/07 0600) Resp:  [18-19] 18 (04/07 0600) BP: (128-154)/(78-93) 128/82 mmHg (04/07 0600) SpO2:  [100 %] 100 % (04/07 0600) FiO2 (%):  [40 %-100 %] 40 % (04/07 0600) Weight:  [162 lb 14.7 oz (73.9 kg)] 162 lb 14.7 oz (73.9 kg) (04/07 0140) HEMODYNAMICS:   VENTILATOR SETTINGS: Vent Mode:  [-] PRVC FiO2 (%):  [40 %-100 %] 40 % Set Rate:  [18 bmp] 18 bmp Vt Set:  [500 mL-530 mL] 530 mL PEEP:  [5 cmH20] 5 cmH20 Plateau Pressure:  [14 cmH20-17 cmH20] 17 cmH20 INTAKE / OUTPUT: Intake/Output     04/06 0701 - 04/07 0700 04/07 0701 - 04/08 0700   I.V. (mL/kg) 240 (3.2)    Total Intake(mL/kg) 240 (3.2)    Urine (mL/kg/hr) 350    Total Output 350     Net -110            PHYSICAL EXAMINATION: General:  Awake, alert,  Neuro:  responding appropriately to commands,  moving extremities equally and spontaneously in soft restraints HEENT:  ETT in place Cardiovascular:   Lungs:  CTAB, no wheezes/crackles Abdomen:  Soft, nt, nd, +BS Skin:  Left ankle bracelet  LABS:  Recent Labs Lab 12/16/12 0255 12/16/12 0430 12/16/12 0448  HGB  --  13.4  --   WBC  --  9.2  --   PLT  --  147*  --   NA  --   --  140  K  --   --  4.0  CL  --   --  108  CO2  --   --  24  GLUCOSE  --   --  116*  BUN  --   --  9  CREATININE  --  0.95 0.98  CALCIUM  --   --  8.9  MG  --   --  2.2  PHOS  --   --  1.6*  PHART 7.437  --   --   PCO2ART 35.7  --   --   PO2ART 524.0*  --   --     Recent Labs Lab 12/16/12 0227 12/16/12 0342  GLUCAP 132* 114*    4/7 CXR: no acute cardiopulmonary process and no infiltrates/edema  4/7 KUB: ASSESSMENT / PLAN:  PULMONARY A: Pt intubated for airway protection P:   -SBT -monitor  CARDIOVASCULAR A: intermittent episode  of ST, EKG ST w/ 1st degree AV block no acute ischemic changes, initial trops negative P:  -cardiac tele -monitor BP if SBP >180 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 -AVOID all Beta-blockers  RENAL A:  Slight hypophoshpatemia (1.6), foley in place, UA - nitrite/ - leuk esterase P:   -replaced PO4 -monitor -change to condom foley  GASTROINTESTINAL A: hx of GERD, NG placed with intubation, known cocaine stuffing (~4g in plastic sandwhich bag), UDS +cocaine, THC, benzo P:   -ppi -tylenol  HEMATOLOGIC A:  No abnormalites P:  monitor  INFECTIOUS A:  Hx of polysubstance abuse and skin abscess ?injection sites, VSS, no leukocytosis P:   -HIV, acute hepatitis panel  ENDOCRINE A:  NPO at this time  P:   -CBG q8h, SSI  NEUROLOGIC A:  Awake and alert, no localizing deficits at this time, ?intention from collateral information  P:   -monitor -WUA daily -may need psych consult will wait once extubated to re-evaluate   GLOBAL - need to speak w parole office re: house arrest  status - ? Whether this was an intentional OD? - high risk for worsening if container bag breaks before he passes it.   TODAY'S SUMMARY:  -plan to extubate -monitor vitals and pending labs  Christen Bame, MD PGY-1 12/16/2012, 7:43 AM  30 minutes CC time  Levy Pupa, MD, PhD 12/16/2012, 12:00 PM [facility] [phone] or if no answer [phone]

## 2012-12-16 NOTE — H&P (Deleted)
Name: Henry Stanley MRN: [id number] DOB: 08/31/1968    LOS: 1  Referring Provider:  Dr. Hyacinth Meeker  Reason for Referral:  Intubated for cocaine overdose  PULMONARY / CRITICAL CARE MEDICINE  HPI:  [name] is a 37 y/o man with no past medical history who presented to the [facility] ED after ingesting about 4g of cocaine.  In the ED he became increasingly agitated then had an apparent seizure and was intubated for airway protection.  His girlfriend, who was at his bedside, is concerned that he may have swallowed something prior to this, although she is not sure what.  She believes that he uses cocaine and marijuana regularly.    Past Medical History  Diagnosis Date  . No significant past medical history according to patient's girlfriend.     Past Surgical History  Procedure Laterality Date  . No past surgeries     Prior to Admission medications   No home medications   Allergies No Known Allergies  Family History No family history on file. - Could not be obtained as pt is intubated and sedated Social History  reports that he uses illicit drugs (Cocaine and Marijuana). His tobacco and alcohol histories are not on file.  Review Of Systems:  Could not be obtained as pt is intubated and sedated  Brief patient description:  37 y/o man intubated after seizure in ED, reported cocaine ingestion  Events Since Admission: Intubated in ED for airway protection.   Current Status:  Vital Signs: Temp:  [97.4 F (36.3 C)] 97.4 F (36.3 C) (04/06 2219) Pulse Rate:  [122-140] 122 (04/06 2330) Resp:  [18] 18 (04/06 2330) BP: (108-143)/(64-92) 143/86 mmHg (04/06 2330) SpO2:  [99 %-100 %] 100 % (04/06 2330) FiO2 (%):  [100 %] 100 % (04/06 2200)  Physical Examination: General:  Laying on stretcher, intubated, sedated Neuro:  Pupils 2mm, reactive, coughing on tube HEENT:  ET tube in place Neck:  Supple, no masses Cardiovascular:  NRRR, no mrg Lungs:  CTAB, no wrr Abdomen:  Soft,  NTND, +BS, no HSM Musculoskeletal:  No joint abnormalities, house arrest bracelet on right ankle Skin:  No bruising or rashes  Principal Problem:   Seizure Active Problems:   Drug overdose   Respiratory failure   ASSESSMENT AND PLAN  PULMONARY No results found for this basename: PHART, PCO2, PCO2ART, PO2ART, HCO3, O2SAT,  in the last 168 hours Ventilator Settings: Vent Mode:  [-] PRVC FiO2 (%):  [100 %] 100 % Set Rate:  [18 bmp] 18 bmp Vt Set:  [500 mL] 500 mL PEEP:  [5 cmH20] 5 cmH20 Plateau Pressure:  [14 cmH20] 14 cmH20 CXR:  ETT 4 cm above carina, no infiltrates ETT:  4cm above carinea  A:  Intubated for airway protection P:   SBT when mental status allows VAP ordered  CARDIOVASCULAR No results found for this basename: TROPONINI, LATICACIDVEN,  O2SATVEN, PROBNP,  in the last 168 hours ECG:  Sinus Tachycardia3 Lines: PIV  A: SInus tach with 1st degree block P:  Associated with cocaine use, no ST elevations to suggest MI Monitor on telemetry  RENAL  Recent Labs Lab 12/15/12 2259 12/15/12 2322  NA 141 145  K 3.1* 3.1*  CL 102 104  CO2 22  --   BUN 10 8  CREATININE 1.14 1.10  CALCIUM 8.9  --    Intake/Output     04/06 0701 - 04/07 0700   I.V. 1000   Total Intake 1000   Net +1000  Foley:  Placed 12/16/2012   A:  No current problems P:   Monitor ins and outs and BUN/Cr  GASTROINTESTINAL  Recent Labs Lab 12/15/12 2259  AST 16  ALT 9  ALKPHOS 68  BILITOT 0.3  PROT 6.8  ALBUMIN 3.6    A:  Ingestion P:   Per girlfriend pt told her he swallowed something but she has no idea what it might have been.  Abd x-ray ordered.  Will monitor  HEMATOLOGIC  Recent Labs Lab 12/15/12 2259 12/15/12 2322  HGB 13.7 14.3  HCT 38.1* 42.0  PLT 137*  --   INR 1.10  --   APTT 24  --    A:  No current problems P:  Monitor h/h   NEUROLOGIC  A:  AMS and seizure after cocaine ingestion P:   Sedation with fentanyl/versed Tox screen  pending.   BEST PRACTICE / DISPOSITION Level of Care:  ICU Primary Service:  pccm Consultants:  none Code Status:  full Diet:  NPO DVT Px:  Heparin, SCDs GI Px:  pepcid Skin Integrity:  good Social / Family:  Girlfriend at bedside.   Carolan Clines., M.D. Pulmonary and Critical Care Medicine [facility] Pager: [phone]  I spent 35 minutes of critical care time in the care of this patient suffered from procedures which are documented elsewhere   12/16/2012, 12:18 AM

## 2012-12-16 NOTE — Progress Notes (Signed)
RT attempted to wean pt 2 times. With each wean, pt is stable and will wean if stimulated and awake but once he falls asleep he goes apneic. Sedation has been off since 8am. Pt placed back on full support.

## 2012-12-16 NOTE — ED Notes (Signed)
Per pt's girlfriend, pt kept stating that "I swallowed something." Per girlfriend, pt has hx of cocaine abuse and "he normally snorts it." Pt on ventilator. Pt on monitor. Family at bedside.

## 2012-12-16 NOTE — ED Notes (Signed)
Pt moving around in bed. Trying to extubate self. 4 Midazolam given given and 100 fentanyl. Pt now calm, VSS.

## 2012-12-16 NOTE — Progress Notes (Signed)
NURSING PROGRESS NOTE  Henry Stanley [id number] Transfer Data: 12/16/2012 6:39 PM Attending Provider: Nelda Bucks, MD PCP:No PCP Per Patient Code Status: full  Henry Stanley is a 37 y.o. male patient transferred from 2100 -No acute distress noted.  -No complaints of shortness of breath.  -No complaints of chest pain.   Cardiac Monitoring: Box # 5502 in place. Cardiac monitor yields:normal sinus rhythm.  Blood pressure 135/72, pulse 93, temperature 98.6 F (37 C), temperature source Oral, resp. rate 16, height 5\' 7"  (1.702 m), weight 73.9 kg (162 lb 14.7 oz), SpO2 100.00%.   IV Fluids:  IV in place, occlusive dsg intact without redness, IV cath antecubital left, condition patent IV antecubital right intact SL.  Allergies:  Review of patient's allergies indicates no known allergies.  Past Medical History:   has no past medical history on file.  Past Surgical History:   has past surgical history that includes Femur Surgery.  Social History:   reports that he has been smoking.  He does not have any smokeless tobacco history on file. He reports that  drinks alcohol. He reports that he uses illicit drugs (Cocaine).  Skin: warm dry intact    Side rails up x 2, fall assessment and education completed with patient/family. Patient/family able to verbalize understanding of risk associated with falls and verbalized understanding to call for assistance before getting out of bed. Call light within reach. Patient/family able to voice and demonstrate understanding of unit orientation instructions.    Will continue to evaluate and treat per MD orders.

## 2012-12-16 NOTE — H&P (Addendum)
Referring Provider: Dr. Hyacinth Meeker  Reason for Referral: Intubated for cocaine overdose  PULMONARY / CRITICAL CARE MEDICINE  HPI: Henry Stanley is a 37 y/o man with no past medical history who presented to the [facility] ED after ingesting about 4g of cocaine. In the ED he became increasingly agitated then had an apparent seizure and was intubated for airway protection. His girlfriend, who was at his bedside, is concerned that he may have swallowed something prior to this, although she is not sure what. She believes that he uses cocaine and marijuana regularly.  Past Medical History   Diagnosis  Date   .  No significant past medical history according to patient's girlfriend.     Past Surgical History   Procedure  Laterality  Date   .  No past surgeries      Prior to Admission medications   No home medications   Allergies  No Known Allergies  Family History  No family history on file. - Could not be obtained as pt is intubated and sedated  Social History  reports that he uses illicit drugs (Cocaine and Marijuana). His tobacco and alcohol histories are not on file.  Review Of Systems: Could not be obtained as pt is intubated and sedated  Brief patient description: 37 y/o man intubated after seizure in ED, reported cocaine ingestion  Events Since Admission:  Intubated in ED for airway protection.  Current Status:  Vital Signs:  Temp: [97.4 F (36.3 C)] 97.4 F (36.3 C) (04/06 2219)  Pulse Rate: [122-140] 122 (04/06 2330)  Resp: [18] 18 (04/06 2330)  BP: (108-143)/(64-92) 143/86 mmHg (04/06 2330)  SpO2: [99 %-100 %] 100 % (04/06 2330)  FiO2 (%): [100 %] 100 % (04/06 2200)  Physical Examination:  General: Laying on stretcher, intubated, sedated  Neuro: Pupils 2mm, reactive, coughing on tube  HEENT: ET tube in place  Neck: Supple, no masses  Cardiovascular: NRRR, no mrg  Lungs: CTAB, no wrr  Abdomen: Soft, NTND, +BS, no HSM  Musculoskeletal: No joint abnormalities, house arrest bracelet  on right ankle  Skin: No bruising or rashes  Principal Problem:  Seizure  Active Problems:  Drug overdose  Respiratory failure  ASSESSMENT AND PLAN  PULMONARY  No results found for this basename: PHART, PCO2, PCO2ART, PO2ART, HCO3, O2SAT, in the last 168 hours  Ventilator Settings:  Vent Mode: [-] PRVC  FiO2 (%): [100 %] 100 %  Set Rate: [18 bmp] 18 bmp  Vt Set: [500 mL] 500 mL  PEEP: [5 cmH20] 5 cmH20  Plateau Pressure: [14 cmH20] 14 cmH20  CXR: ETT 4 cm above carina, no infiltrates  ETT: 4cm above carinea  A: Intubated for airway protection  P:  SBT when mental status allows  VAP ordered  CARDIOVASCULAR  No results found for this basename: TROPONINI, LATICACIDVEN, O2SATVEN, PROBNP, in the last 168 hours  ECG: Sinus Tachycardia3  Lines: PIV  A: SInus tach with 1st degree block  P:  Associated with cocaine use, no ST elevations to suggest MI  Monitor on telemetry  RENAL  Recent Labs  Lab  12/15/12 2259  12/15/12 2322   NA  141  145   K  3.1*  3.1*   CL  102  104   CO2  22  --   BUN  10  8   CREATININE  1.14  1.10   CALCIUM  8.9  --   Intake/Output  04/06 0701 - 04/07 0700  I.V. 1000  Total Intake  1000  Net +1000  Foley: Placed 12/16/2012  A: No current problems  P:  Monitor ins and outs and BUN/Cr  GASTROINTESTINAL  Recent Labs  Lab  12/15/12 2259   AST  16   ALT  9   ALKPHOS  68   BILITOT  0.3   PROT  6.8   ALBUMIN  3.6   A: Ingestion  P:  Per girlfriend pt told her he swallowed something but she has no idea what it might have been.  Abd x-ray ordered.  Will monitor  HEMATOLOGIC  Recent Labs  Lab  12/15/12 2259  12/15/12 2322   HGB  13.7  14.3   HCT  38.1*  42.0   PLT  137*  --   INR  1.10  --   APTT  24  --   A: No current problems  P:  Monitor h/h  NEUROLOGIC  A: AMS and seizure after cocaine ingestion  P:  Sedation with fentanyl/versed  Tox screen pending.  BEST PRACTICE / DISPOSITION  Level of Care: ICU  Primary Service: pccm   Consultants: none  Code Status: full  Diet: NPO  DVT Px: Heparin, SCDs  GI Px: pepcid  Skin Integrity: good  Social / Family: Girlfriend at bedside.  Carolan Clines., M.D.  Pulmonary and Critical Care Medicine  [facility]  Pager: [phone]  I spent 35 minutes of critical care time in the care of this patient suffered from procedures which are documented elsewhere  12/16/2012, 12:18 AM

## 2012-12-17 DIAGNOSIS — J9600 Acute respiratory failure, unspecified whether with hypoxia or hypercapnia: Secondary | ICD-10-CM

## 2012-12-17 LAB — HEPATITIS PANEL, ACUTE
HCV Ab: NEGATIVE
Hep A IgM: NEGATIVE
Hep B C IgM: NEGATIVE

## 2012-12-17 LAB — URINE CULTURE: Colony Count: NO GROWTH

## 2012-12-17 MED ORDER — FAMOTIDINE 20 MG PO TABS
20.0000 mg | ORAL_TABLET | Freq: Two times a day (BID) | ORAL | Status: DC | PRN
  Filled 2012-12-17: qty 1

## 2012-12-17 MED ORDER — OMEPRAZOLE MAGNESIUM 20 MG PO TBEC: 20.0000 mg | DELAYED_RELEASE_TABLET | Freq: Every day | ORAL | Status: DC

## 2012-12-17 NOTE — Progress Notes (Signed)
PULMONARY  / CRITICAL CARE MEDICINE  Name: Henry Stanley MRN: [id number] DOB: 07-12-1976    ADMISSION DATE:  12/16/2012 CONSULTATION DATE:  12/16/12  REFERRING MD :  Dr. Hyacinth Meeker PRIMARY SERVICE: PCCM  CHIEF COMPLAINT:  Cocaine stuffing (~4g in sandwhich bag)  BRIEF PATIENT DESCRIPTION: Henry Stanley is a 37 y/o man with extensive hx of cocaine stuffing and polysubstance abuse who presented to the [facility] ED after ingesting about 4g of cocaine. In the ED he became increasingly agitated then had an apparent seizure and was intubated for airway protection. His girlfriend, who was at his bedside, is concerned that he may have swallowed something prior to this, although she is not sure what. She believes that he uses cocaine and marijuana regularly.    SIGNIFICANT EVENTS / STUDIES:  4/7-intubated  LINES / TUBES: PIV 4/7>> ETT 4/7 >> 4/7  CULTURES: Urine 4/7 >> negative   SUBJECTIVE:  Denies chest pain, dypnea, abdominal pain.  Feels hungry.  VITAL SIGNS: Temp:  [98.1 F (36.7 C)-98.9 F (37.2 C)] 98.8 F (37.1 C) (04/08 0522) Pulse Rate:  [82-100] 93 (04/08 0522) Resp:  [9-18] 17 (04/08 0522) BP: (110-136)/(68-92) 110/72 mmHg (04/08 0522) SpO2:  [97 %-100 %] 97 % (04/08 0522) FiO2 (%):  [28 %-40 %] 28 % (04/07 1325) Weight:  [168 lb 3.4 oz (76.3 kg)] 168 lb 3.4 oz (76.3 kg) (04/08 0522) RA INTAKE / OUTPUT: Intake/Output     04/07 0701 - 04/08 0700 04/08 0701 - 04/09 0700   I.V. (mL/kg) 90 (1.2)    IV Piggyback 50    Total Intake(mL/kg) 140 (1.8)    Urine (mL/kg/hr) 740 (0.4)    Total Output 740     Net -600            PHYSICAL EXAMINATION: General: No distress Neuro:  Calm, appropriate, normal strength HEENT: No sinus tenderness Cardiovascular:  regular Lungs: no wheeze Abdomen:  Soft, non tender Skin: no edema  LABS:  Recent Labs Lab 12/16/12 0255 12/16/12 0430 12/16/12 0448  HGB  --  13.4  --   WBC  --  9.2  --   PLT  --  147*  --   NA  --   --  140   K  --   --  4.0  CL  --   --  108  CO2  --   --  24  GLUCOSE  --   --  116*  BUN  --   --  9  CREATININE  --  0.95 0.98  CALCIUM  --   --  8.9  MG  --   --  2.2  PHOS  --   --  1.6*  PHART 7.437  --   --   PCO2ART 35.7  --   --   PO2ART 524.0*  --   --     Recent Labs Lab 12/16/12 0227 12/16/12 0342 12/16/12 0748 12/16/12 1543  GLUCAP 132* 114* 107* 103*   Imaging: Dg Chest Port 1 View  12/16/2012  *RADIOLOGY REPORT*  Clinical Data: Evaluate lung fields.  Shortness of breath. Endotracheal tube position.  PORTABLE CHEST - 1 VIEW  Comparison: Chest x-ray 03/10/2010.  Findings: An endotracheal tube is in place with tip 3.6 cm above the carina. A nasogastric tube is seen extending into the stomach, however, the tip of the nasogastric tube extends below the lower margin of the image.  Lung volumes are low.  No consolidative airspace disease.  No  pleural effusions.  Pulmonary vasculature and the cardiomediastinal silhouette are within normal limits.  IMPRESSION: 1.  Support apparatus, as above. 2.  Low lung volumes without radiographic evidence of acute cardiopulmonary disease.   Original Report Authenticated By: Trudie Reed, M.D.     ASSESSMENT / PLAN:   Acute respiratory failure 2nd to altered mental status for airway protection >> resolved. Hx of tobacco abuse. P: Smoking cessation  Hx of GERD P:   Pepcid   Altered mental status 2nd to cocaine, benzo, THC overdose. Hx of polysubstance abuse. P: Monitor mental status F/u hepatitis panel  Global. P: Need to have social worker check with parole officer before d/c home; otherwise medically stable for d/c home.  Coralyn Helling, MD [facility] Pulmonary/Critical Care 12/17/2012, 8:56 AM Pager:  [phone] After 3pm call: [phone]

## 2012-12-17 NOTE — Care Management Note (Signed)
    Page 1 of 1   12/17/2012     12:17:51 PM   CARE MANAGEMENT NOTE 12/17/2012  Patient:  Henry Stanley, Henry Stanley   Account Number:  [id number]  Date Initiated:  12/16/2012  Documentation initiated by:  Avie Arenas  Subjective/Objective Assessment:   Cocaine OD - seizures - intubated.  Girl friend at bedside.     Action/Plan:   Anticipated DC Date:  12/17/2012   Anticipated DC Plan:  HOME/SELF CARE  In-house referral  Clinical Social Worker      DC Planning Services  CM consult      Choice offered to / List presented to:             Status of service:  Completed, signed off Medicare Important Message given?   (If response is "NO", the following Medicare IM given date fields will be blank) Date Medicare IM given:   Date Additional Medicare IM given:    Discharge Disposition:  HOME/SELF CARE  Per UR Regulation:  Reviewed for med. necessity/level of care/duration of stay  If discussed at Long Length of Stay Meetings, dates discussed:    Comments:  ContactTami Ribas Significant other [phone]                 Bechtel,Curlie Sittner Mother     [phone]  12/17/12 12:15 Letha Cape RN, BSN [phone] patient lives with girlfriend, pt is for discharge today, he states he will possibly need a bus pass, CSW aware. Patient states he can afford the prilosec otc at [facility] for $20.  NCM will continue to follow for dc needs.

## 2012-12-17 NOTE — Discharge Summary (Signed)
Physician Discharge Summary  Henry Stanley:[id number] DOB: [birth year] DOA: 12/16/2012  PCP: No PCP Per Patient  Admit date: 12/16/2012 Discharge date: 12/17/2012  Time spent: 30 minutes  Recommendations for Outpatient Follow-up:  1. Follow up with PCP  Discharge Diagnoses:  Active Problems:   Acute respiratory failure   Cocaine abuse   Depression   Discharge Condition: stable  Diet recommendation: heart healthy  Filed Weights   12/16/12 0140 12/17/12 0522  Weight: 73.9 kg (162 lb 14.7 oz) 76.3 kg (168 lb 3.4 oz)    History of present illness:  37 y/o man with no past medical history who presented to the [facility] ED after ingesting about 4g of cocaine. In the ED he became increasingly agitated then had an apparent seizure and was intubated for airway protection. His girlfriend, who was at his bedside, is concerned that he may have swallowed something prior to this, although she is not sure what. She believes that he uses cocaine and marijuana regularly.    Hospital Course:  Acute respiratory failure:  - Resolved intubated and extubated 4.7.2014  - No further seizure.  - No events telemetry.   Cocaine abuse:  - counseling.   Procedures:  none  Consultations:  PCCM  Discharge Exam: Filed Vitals:   12/16/12 1700 12/16/12 1830 12/16/12 2115 12/17/12 0522  BP: 117/74 135/72 125/68 110/72  Pulse: 93  87 93  Temp:  98.6 F (37 C) 98.9 F (37.2 C) 98.8 F (37.1 C)  TempSrc:  Oral Oral Oral  Resp: 10 16 18 17   Height:      Weight:    76.3 kg (168 lb 3.4 oz)  SpO2: 100% 100% 98% 97%    General: see progress note Discharge Instructions  Discharge Orders   Future Orders Complete By Expires     Diet - low sodium heart healthy  As directed     Increase activity slowly  As directed         Medication List    STOP taking these medications       acetaminophen 500 MG tablet  Commonly known as:  TYLENOL      TAKE these medications       omeprazole  20 MG tablet  Commonly known as:  PRILOSEC OTC  Take 1 tablet (20 mg total) by mouth daily.           Follow-up Information   Follow up with No PCP Per Patient In 2 weeks. (hospital follow up)    Contact information:   [address] [place] [address] [phone]        The results of significant diagnostics from this hospitalization (including imaging, microbiology, ancillary and laboratory) are listed below for reference.    Significant Diagnostic Studies: Dg Chest Port 1 View  12/16/2012  *RADIOLOGY REPORT*  Clinical Data: Evaluate lung fields.  Shortness of breath. Endotracheal tube position.  PORTABLE CHEST - 1 VIEW  Comparison: Chest x-ray 03/10/2010.  Findings: An endotracheal tube is in place with tip 3.6 cm above the carina. A nasogastric tube is seen extending into the stomach, however, the tip of the nasogastric tube extends below the lower margin of the image.  Lung volumes are low.  No consolidative airspace disease.  No pleural effusions.  Pulmonary vasculature and the cardiomediastinal silhouette are within normal limits.  IMPRESSION: 1.  Support apparatus, as above. 2.  Low lung volumes without radiographic evidence of acute cardiopulmonary disease.   Original Report Authenticated By:  Trudie Reed, M.D.     Microbiology: Recent Results (from the past 240 hour(s))  MRSA PCR SCREENING     Status: None   Collection Time    12/16/12  1:40 AM      Result Value Range Status   MRSA by PCR NEGATIVE  NEGATIVE Final   Comment:            The GeneXpert MRSA Assay (FDA     approved for NASAL specimens     only), is one component of a     comprehensive MRSA colonization     surveillance program. It is not     intended to diagnose MRSA     infection nor to guide or     monitor treatment for     MRSA infections.  URINE CULTURE     Status: None   Collection Time    12/16/12  1:55 AM      Result Value Range Status   Specimen Description URINE, CATHETERIZED    Final   Special Requests NONE   Final   Culture  Setup Time 12/16/2012 08:47   Final   Colony Count NO GROWTH   Final   Culture NO GROWTH   Final   Report Status 12/17/2012 FINAL   Final     Labs: Basic Metabolic Panel:  Recent Labs Lab 12/16/12 0430 12/16/12 0448  NA  --  140  K  --  4.0  CL  --  108  CO2  --  24  GLUCOSE  --  116*  BUN  --  9  CREATININE 0.95 0.98  CALCIUM  --  8.9  MG  --  2.2  PHOS  --  1.6*   Liver Function Tests: No results found for this basename: AST, ALT, ALKPHOS, BILITOT, PROT, ALBUMIN,  in the last 168 hours No results found for this basename: LIPASE, AMYLASE,  in the last 168 hours No results found for this basename: AMMONIA,  in the last 168 hours CBC:  Recent Labs Lab 12/16/12 0430  WBC 9.2  HGB 13.4  HCT 37.6*  MCV 89.7  PLT 147*   Cardiac Enzymes: No results found for this basename: CKTOTAL, CKMB, CKMBINDEX, TROPONINI,  in the last 168 hours BNP: BNP (last 3 results) No results found for this basename: PROBNP,  in the last 8760 hours CBG:  Recent Labs Lab 12/16/12 0227 12/16/12 0342 12/16/12 0748 12/16/12 1543  GLUCAP 132* 114* 107* 103*       Signed:  FELIZ ORTIZ, ABRAHAM  [facility] 12/17/2012, 10:03 AM

## 2012-12-17 NOTE — Progress Notes (Signed)
[facility] PROGRESS NOTE Assessment/Plan: Acute respiratory failure: - Resolved extubated 4.7.2014 - No further seizure. - No events telemetry.  Cocaine abuse: - counseling.    Code Status: full Family Communication: mother  Disposition Plan: home   Consultants:  none  Procedures:  none  Antibiotics:  none   HPI/Subjective: No complains.  Objective: Filed Vitals:   12/16/12 1700 12/16/12 1830 12/16/12 2115 12/17/12 0522  BP: 117/74 135/72 125/68 110/72  Pulse: 93  87 93  Temp:  98.6 F (37 C) 98.9 F (37.2 C) 98.8 F (37.1 C)  TempSrc:  Oral Oral Oral  Resp: 10 16 18 17   Height:      Weight:    76.3 kg (168 lb 3.4 oz)  SpO2: 100% 100% 98% 97%    Intake/Output Summary (Last 24 hours) at 12/17/12 0713 Last data filed at 12/17/12 0522  Gross per 24 hour  Intake     90 ml  Output    740 ml  Net   -650 ml   Filed Weights   12/16/12 0140 12/17/12 0522  Weight: 73.9 kg (162 lb 14.7 oz) 76.3 kg (168 lb 3.4 oz)    Exam:  General: Alert, awake, oriented x3, in no acute distress.  HEENT: No bruits, no goiter.  Heart: Regular rate and rhythm, without murmurs, rubs, gallops.  Lungs: Good air movement, clear to ascultation  Abdomen: Soft, nontender, nondistended, positive bowel sounds.  Neuro: Grossly intact, nonfocal.   Data Reviewed: Basic Metabolic Panel:  Recent Labs Lab 12/16/12 0430 12/16/12 0448  NA  --  140  K  --  4.0  CL  --  108  CO2  --  24  GLUCOSE  --  116*  BUN  --  9  CREATININE 0.95 0.98  CALCIUM  --  8.9  MG  --  2.2  PHOS  --  1.6*   Liver Function Tests: No results found for this basename: AST, ALT, ALKPHOS, BILITOT, PROT, ALBUMIN,  in the last 168 hours No results found for this basename: LIPASE, AMYLASE,  in the last 168 hours No results found for this basename: AMMONIA,  in the last 168 hours CBC:  Recent Labs Lab 12/16/12 0430  WBC 9.2  HGB 13.4  HCT 37.6*  MCV 89.7  PLT 147*   Cardiac  Enzymes: No results found for this basename: CKTOTAL, CKMB, CKMBINDEX, TROPONINI,  in the last 168 hours BNP (last 3 results) No results found for this basename: PROBNP,  in the last 8760 hours CBG:  Recent Labs Lab 12/16/12 0227 12/16/12 0342 12/16/12 0748 12/16/12 1543  GLUCAP 132* 114* 107* 103*    Recent Results (from the past 240 hour(s))  MRSA PCR SCREENING     Status: None   Collection Time    12/16/12  1:40 AM      Result Value Range Status   MRSA by PCR NEGATIVE  NEGATIVE Final   Comment:            The GeneXpert MRSA Assay (FDA     approved for NASAL specimens     only), is one component of a     comprehensive MRSA colonization     surveillance program. It is not     intended to diagnose MRSA     infection nor to guide or     monitor treatment for     MRSA infections.  URINE CULTURE     Status: None   Collection Time  12/16/12  1:55 AM      Result Value Range Status   Specimen Description URINE, CATHETERIZED   Final   Special Requests NONE   Final   Culture  Setup Time 12/16/2012 08:47   Final   Colony Count NO GROWTH   Final   Culture NO GROWTH   Final   Report Status 12/17/2012 FINAL   Final     Studies: Dg Chest Port 1 View  12/16/2012  *RADIOLOGY REPORT*  Clinical Data: Evaluate lung fields.  Shortness of breath. Endotracheal tube position.  PORTABLE CHEST - 1 VIEW  Comparison: Chest x-ray 03/10/2010.  Findings: An endotracheal tube is in place with tip 3.6 cm above the carina. A nasogastric tube is seen extending into the stomach, however, the tip of the nasogastric tube extends below the lower margin of the image.  Lung volumes are low.  No consolidative airspace disease.  No pleural effusions.  Pulmonary vasculature and the cardiomediastinal silhouette are within normal limits.  IMPRESSION: 1.  Support apparatus, as above. 2.  Low lung volumes without radiographic evidence of acute cardiopulmonary disease.   Original Report Authenticated By: Trudie Reed, M.D.     Scheduled Meds: . famotidine (PEPCID) IV  20 mg Intravenous Q12H  . heparin  5,000 Units Subcutaneous Q8H   Continuous Infusions:    Marinda Elk  [facility] Pager [phone]. If 8PM-8AM, please contact night-coverage at [email or web address], password [facility] 12/17/2012, 7:13 AM  LOS: 1 day

## 2012-12-17 NOTE — Progress Notes (Signed)
Henry Stanley [facility] discharged Home. per MD order.  Discharge instructions reviewed and discussed with the patient, all questions and concerns answered. Copy of instructions and scripts given to patient.  Called [name] to let her know he was discharged.  Explained to pt. How to sign up for mychart.    Medication List    STOP taking these medications       acetaminophen 500 MG tablet  Commonly known as:  TYLENOL      TAKE these medications       omeprazole 20 MG tablet  Commonly known as:  PRILOSEC OTC  Take 1 tablet (20 mg total) by mouth daily.        Patients skin is clean, dry and intact, no evidence of skin break down. IV site discontinued and catheter remains intact. Site without signs and symptoms of complications. Dressing and pressure applied.  Patient walked out by himself,  no distress noted upon discharge.  [name], [name] 12/17/2012 5:47 PM

## 2012-12-17 NOTE — Plan of Care (Signed)
Problem: Phase I Progression Outcomes Goal: Initial discharge plan identified Outcome: Completed/Met Date Met:  12/17/12 To return home with girlfriend

## 2013-02-08 ENCOUNTER — Emergency Department (HOSPITAL_COMMUNITY): Payer: Self-pay

## 2013-02-08 ENCOUNTER — Emergency Department (HOSPITAL_COMMUNITY)
Admission: EM | Admit: 2013-02-08 | Discharge: 2013-02-08 | Disposition: A | Discharge status: Home / Self Care | Payer: Self-pay | Attending: Emergency Medicine | Admitting: Emergency Medicine

## 2013-02-08 ENCOUNTER — Encounter (HOSPITAL_COMMUNITY): Payer: Self-pay | Admitting: Emergency Medicine

## 2013-02-08 DIAGNOSIS — R002 Palpitations: Secondary | ICD-10-CM | POA: Insufficient documentation

## 2013-02-08 DIAGNOSIS — R0602 Shortness of breath: Secondary | ICD-10-CM | POA: Insufficient documentation

## 2013-02-08 DIAGNOSIS — R079 Chest pain, unspecified: Secondary | ICD-10-CM | POA: Insufficient documentation

## 2013-02-08 DIAGNOSIS — F17200 Nicotine dependence, unspecified, uncomplicated: Secondary | ICD-10-CM | POA: Insufficient documentation

## 2013-02-08 DIAGNOSIS — R000 Tachycardia, unspecified: Secondary | ICD-10-CM | POA: Insufficient documentation

## 2013-02-08 DIAGNOSIS — F1410 Cocaine abuse, uncomplicated: Secondary | ICD-10-CM | POA: Insufficient documentation

## 2013-02-08 LAB — COMPREHENSIVE METABOLIC PANEL
AST: 14 U/L (ref 0–37)
BUN: 11 mg/dL (ref 6–23)
CO2: 25 mEq/L (ref 19–32)
Chloride: 100 mEq/L (ref 96–112)
Creatinine, Ser: 1.19 mg/dL (ref 0.50–1.35)
GFR calc Af Amer: 89 mL/min — ABNORMAL LOW (ref >90)
GFR calc non Af Amer: 77 mL/min — ABNORMAL LOW (ref >90)
Glucose, Bld: 136 mg/dL — ABNORMAL HIGH (ref 70–99)
Total Bilirubin: 0.5 mg/dL (ref 0.3–1.2)

## 2013-02-08 LAB — CBC WITH DIFFERENTIAL/PLATELET
Basophils Absolute: 0 10*3/uL (ref 0.0–0.1)
HCT: 40 % (ref 39.0–52.0)
Hemoglobin: 14.3 g/dL (ref 13.0–17.0)
Lymphocytes Relative: 31 % (ref 12–46)
Lymphs Abs: 3.5 10*3/uL (ref 0.7–4.0)
MCV: 92.8 fL (ref 78.0–100.0)
Monocytes Absolute: 0.8 10*3/uL (ref 0.1–1.0)
Monocytes Relative: 7 % (ref 3–12)
Neutro Abs: 6.7 10*3/uL (ref 1.7–7.7)
WBC: 11.3 10*3/uL — ABNORMAL HIGH (ref 4.0–10.5)

## 2013-02-08 LAB — POCT I-STAT TROPONIN I: Troponin i, poc: 0 ng/mL (ref 0.00–0.08)

## 2013-02-08 MED ORDER — ASPIRIN 81 MG PO CHEW
324.0000 mg | CHEWABLE_TABLET | Freq: Once | ORAL | Status: AC
  Administered 2013-02-08: 324 mg via ORAL
  Filled 2013-02-08: qty 4

## 2013-02-08 MED ORDER — LORAZEPAM 2 MG/ML IJ SOLN
2.0000 mg | Freq: Once | INTRAMUSCULAR | Status: AC
  Administered 2013-02-08: 2 mg via INTRAVENOUS
  Filled 2013-02-08: qty 1

## 2013-02-08 MED ORDER — SODIUM CHLORIDE 0.9 % IV BOLUS (SEPSIS)
1000.0000 mL | Freq: Once | INTRAVENOUS | Status: AC
  Administered 2013-02-08: 1000 mL via INTRAVENOUS

## 2013-02-08 MED ORDER — NITROGLYCERIN 0.4 MG SL SUBL
0.4000 mg | SUBLINGUAL_TABLET | SUBLINGUAL | Status: DC | PRN
  Administered 2013-02-08: 0.4 mg via SUBLINGUAL
  Filled 2013-02-08: qty 25

## 2013-02-08 MED ORDER — LORAZEPAM 2 MG/ML IJ SOLN
1.0000 mg | Freq: Once | INTRAMUSCULAR | Status: AC
  Administered 2013-02-08: 1 mg via INTRAVENOUS
  Filled 2013-02-08: qty 1

## 2013-02-08 NOTE — ED Notes (Signed)
Resting with eyes closed NAD noted VS Stable

## 2013-02-08 NOTE — ED Notes (Signed)
Pt.ambulated down the hallway he  Walked steady with no assit

## 2013-02-08 NOTE — ED Notes (Signed)
Appears to be sleeping.

## 2013-02-08 NOTE — ED Notes (Signed)
PAtient discharged with instructions using the teach back method patient verbalizes an understanding

## 2013-02-08 NOTE — ED Notes (Signed)
Patient awake although very groggy.

## 2013-02-08 NOTE — ED Provider Notes (Signed)
History     CSN: [id number]  Arrival date & time 02/08/13  0831   First MD Initiated Contact with Patient 02/08/13 [phone]      Chief Complaint  Patient presents with  . Palpitations    (Consider location/radiation/quality/duration/timing/severity/associated sxs/prior treatment) HPI Comments: Patient presents to the emergency department with chief complaint of heart palpitations. He states that he snorted cocaine this morning, and is now having heart palpitations with a moderate amount of chest pain and some shortness of breath. He has not taken anything for his symptoms. Nothing makes his symptoms better or worse. The pain does not radiate. He is mildly agitated in the room. He was seen recently for cocaine intoxication, and was intubated.  The history is provided by the patient. No language interpreter was used.    History reviewed. No pertinent past medical history.  Past Surgical History  Procedure Laterality Date  . Femur surgery      History reviewed. No pertinent family history.  History  Substance Use Topics  . Smoking status: Current Every Day Smoker -- 0.50 packs/day  . Smokeless tobacco: Not on file  . Alcohol Use: Yes     Comment: rare      Review of Systems  All other systems reviewed and are negative.    Allergies  Review of patient's allergies indicates no known allergies.  Home Medications  No current outpatient prescriptions on file.  BP 156/96  Pulse 109  Temp(Src) 97.7 F (36.5 C) (Oral)  Resp 18  SpO2 98%  Physical Exam  Nursing note and vitals reviewed. Constitutional: He is oriented to person, place, and time. He appears well-developed and well-nourished.  HENT:  Head: Normocephalic and atraumatic.  Eyes: Conjunctivae and EOM are normal. Pupils are equal, round, and reactive to light. Right eye exhibits no discharge. Left eye exhibits no discharge. No scleral icterus.  Neck: Normal range of motion. Neck supple. No JVD present.   Cardiovascular: Regular rhythm and normal heart sounds.  Exam reveals no gallop and no friction rub.   No murmur heard. Tachycardic  Pulmonary/Chest: Effort normal and breath sounds normal. No respiratory distress. He has no wheezes. He has no rales. He exhibits no tenderness.  Abdominal: Soft. Bowel sounds are normal. He exhibits no distension and no mass. There is no tenderness. There is no rebound and no guarding.  Musculoskeletal: Normal range of motion. He exhibits no edema and no tenderness.  Neurological: He is alert and oriented to person, place, and time. He has normal reflexes.  CN 3-12 intact  Skin: Skin is warm and dry.  Psychiatric: He has a normal mood and affect. His behavior is normal. Judgment and thought content normal.    ED Course  Procedures (including critical care time)  Labs Reviewed  CBC WITH DIFFERENTIAL  COMPREHENSIVE METABOLIC PANEL   Results for orders placed during the hospital encounter of 02/08/13  CBC WITH DIFFERENTIAL      Result Value Range   WBC 11.3 (*) 4.0 - 10.5 K/uL   RBC 4.31  4.22 - 5.81 MIL/uL   Hemoglobin 14.3  13.0 - 17.0 g/dL   HCT 11.9  14.7 - 82.9 %   MCV 92.8  78.0 - 100.0 fL   MCH 33.2  26.0 - 34.0 pg   MCHC 35.8  30.0 - 36.0 g/dL   RDW 56.2  13.0 - 86.5 %   Platelets 182  150 - 400 K/uL   Neutrophils Relative % 59  43 - 77 %  Neutro Abs 6.7  1.7 - 7.7 K/uL   Lymphocytes Relative 31  12 - 46 %   Lymphs Abs 3.5  0.7 - 4.0 K/uL   Monocytes Relative 7  3 - 12 %   Monocytes Absolute 0.8  0.1 - 1.0 K/uL   Eosinophils Relative 2  0 - 5 %   Eosinophils Absolute 0.3  0.0 - 0.7 K/uL   Basophils Relative 0  0 - 1 %   Basophils Absolute 0.0  0.0 - 0.1 K/uL  COMPREHENSIVE METABOLIC PANEL      Result Value Range   Sodium 138  135 - 145 mEq/L   Potassium 3.2 (*) 3.5 - 5.1 mEq/L   Chloride 100  96 - 112 mEq/L   CO2 25  19 - 32 mEq/L   Glucose, Bld 136 (*) 70 - 99 mg/dL   BUN 11  6 - 23 mg/dL   Creatinine, Ser 2.13  0.50 - 1.35  mg/dL   Calcium 08.6 (*) 8.4 - 10.5 mg/dL   Total Protein 8.0  6.0 - 8.3 g/dL   Albumin 4.6  3.5 - 5.2 g/dL   AST 14  0 - 37 U/L   ALT 7  0 - 53 U/L   Alkaline Phosphatase 77  39 - 117 U/L   Total Bilirubin 0.5  0.3 - 1.2 mg/dL   GFR calc non Af Amer 77 (*) >90 mL/min   GFR calc Af Amer 89 (*) >90 mL/min  POCT I-STAT TROPONIN I      Result Value Range   Troponin i, poc 0.01  0.00 - 0.08 ng/mL   Comment 3           POCT I-STAT TROPONIN I      Result Value Range   Troponin i, poc 0.00  0.00 - 0.08 ng/mL   Comment 3            Dg Chest 2 View  02/08/2013   *RADIOLOGY REPORT*  Clinical Data: Chest pain  CHEST - 2 VIEW  Comparison: 12/16/2012  Findings: Lungs are clear. No pleural effusion or pneumothorax.  The heart is normal in size.  Visualized osseous structures are within normal limits.  IMPRESSION: No evidence of acute cardiopulmonary disease.   Original Report Authenticated By: Charline Bills, M.D.     ED ECG REPORT  I personally interpreted this EKG   Date: 02/08/2013   Rate: 102  Rhythm: sinus tachycardia  QRS Axis: normal  Intervals: PR prolonged  ST/T Wave abnormalities: nonspecific ST/T changes  Conduction Disutrbances:first-degree A-V block   Narrative Interpretation:   Old EKG Reviewed: changes noted, slower rate    1. Palpitations   2. Chest pain   3. Cocaine abuse       MDM  Patient with CP and palpitations following cocaine use.  Will check labs, give aspirin, SL nitro, and ativan.  Patient discussed with Dr. Anitra Lauth.  2:54 PM Patient is chest pain/palpitation free.  He is no longer agitated.  Delta troponin is negative.  Doubt ACS.  Will discharge to home with instructions to quit cocaine.  PCP follow-up.  Return precautions given.  Patient is stable and ready for discharge.    Filed Vitals:   02/08/13 1329  BP: 108/63  Pulse: 77  Temp:   Resp: [address], PA-C 02/08/13 1457  Roxy Horseman, PA-C 02/08/13 1457

## 2013-02-08 NOTE — ED Notes (Signed)
Back from xray

## 2013-02-08 NOTE — ED Notes (Signed)
Patient snorted cocaine at approximately 3:30 am and smoked mariajuana at approximately 07:30. Now presents to the medical department with c/o palpations. Denies CP

## 2013-02-08 NOTE — ED Notes (Signed)
Patient transported to X-ray 

## 2013-02-08 NOTE — ED Notes (Signed)
Resting with eyes closed appears to be sleeping

## 2013-02-09 NOTE — ED Provider Notes (Signed)
Medical screening examination/treatment/procedure(s) were performed by non-physician practitioner and as supervising physician I was immediately available for consultation/collaboration.   Gwyneth Sprout, MD 02/09/13 [phone]

## 2013-03-27 ENCOUNTER — Emergency Department (HOSPITAL_COMMUNITY)
Admission: EM | Admit: 2013-03-27 | Discharge: 2013-03-27 | Disposition: A | Discharge status: Home / Self Care | Payer: Self-pay | Attending: Emergency Medicine | Admitting: Emergency Medicine

## 2013-03-27 ENCOUNTER — Encounter (HOSPITAL_COMMUNITY): Payer: Self-pay | Admitting: *Deleted

## 2013-03-27 DIAGNOSIS — K921 Melena: Secondary | ICD-10-CM | POA: Insufficient documentation

## 2013-03-27 DIAGNOSIS — Z7982 Long term (current) use of aspirin: Secondary | ICD-10-CM | POA: Insufficient documentation

## 2013-03-27 DIAGNOSIS — F17200 Nicotine dependence, unspecified, uncomplicated: Secondary | ICD-10-CM | POA: Insufficient documentation

## 2013-03-27 DIAGNOSIS — F1410 Cocaine abuse, uncomplicated: Secondary | ICD-10-CM

## 2013-03-27 DIAGNOSIS — Z79899 Other long term (current) drug therapy: Secondary | ICD-10-CM | POA: Insufficient documentation

## 2013-03-27 DIAGNOSIS — R195 Other fecal abnormalities: Secondary | ICD-10-CM

## 2013-03-27 LAB — URINALYSIS, ROUTINE W REFLEX MICROSCOPIC
Bilirubin Urine: NEGATIVE
Hgb urine dipstick: NEGATIVE
Ketones, ur: NEGATIVE mg/dL
Protein, ur: NEGATIVE mg/dL
Urobilinogen, UA: 0.2 mg/dL (ref 0.0–1.0)

## 2013-03-27 LAB — CBC
HCT: 41.9 % (ref 39.0–52.0)
Hemoglobin: 14.9 g/dL (ref 13.0–17.0)
MCH: 33 pg (ref 26.0–34.0)
MCHC: 35.6 g/dL (ref 30.0–36.0)

## 2013-03-27 LAB — BASIC METABOLIC PANEL
BUN: 8 mg/dL (ref 6–23)
Calcium: 10.5 mg/dL (ref 8.4–10.5)
Creatinine, Ser: 1.01 mg/dL (ref 0.50–1.35)
GFR calc non Af Amer: >90 mL/min (ref >90)
Glucose, Bld: 116 mg/dL — ABNORMAL HIGH (ref 70–99)
Sodium: 138 mEq/L (ref 135–145)

## 2013-03-27 MED ORDER — HYDROCORTISONE ACETATE 25 MG RE SUPP: 25.0000 mg | Freq: Two times a day (BID) | RECTAL | Status: DC

## 2013-03-27 NOTE — ED Provider Notes (Signed)
Medical screening examination/treatment/procedure(s) were performed by non-physician practitioner and as supervising physician I was immediately available for consultation/collaboration.   Tylyn Derwin III, MD 03/27/13 2023 

## 2013-03-27 NOTE — ED Provider Notes (Signed)
History    CSN: [id number] Arrival date & time 03/27/13  4098  None    Chief Complaint  Patient presents with  . Abdominal Pain   (Consider location/radiation/quality/duration/timing/severity/associated sxs/prior Treatment) HPI Comments: Patient is a 37 year old male with a history of cocaine use who presents for abdominal pain with onset 4 hours ago. Patient states the pain was sharp in nature and constant, radiating from his bilateral inguinal region down to his scrotum and bilateral testicles. Patient denies any modifying factors of his pain. Patient admits to associated BRBPR after a normal bowel movement; blood was on his toilet tissue after wiping, but not in his stool. Patient endorses using half a gram of cocaine tonight and drinking a pint of moonshine. Patient is sexually active and does not consistently use barrier protection when engaging in intercourse. He denies fevers, CP, SOB, N/V/D, melena, penile redness/swelling, scrotal swelling, penile d/c, dysuria, hematuria, and extremity weakness.  Patient is a 37 y.o. male presenting with abdominal pain. The history is provided by the patient. No language interpreter was used.  Abdominal Pain Associated symptoms include abdominal pain. Pertinent negatives include no chest pain, fever, nausea, vomiting or weakness.   History reviewed. No pertinent past medical history. Past Surgical History  Procedure Laterality Date  . Femur surgery     History reviewed. No pertinent family history. History  Substance Use Topics  . Smoking status: Current Every Day Smoker -- 0.50 packs/day  . Smokeless tobacco: Not on file  . Alcohol Use: Yes     Comment: rare    Review of Systems  Constitutional: Negative for fever.  Respiratory: Negative for shortness of breath.   Cardiovascular: Negative for chest pain.  Gastrointestinal: Positive for abdominal pain. Negative for nausea, vomiting and diarrhea.  Genitourinary: Negative for dysuria,  hematuria, discharge, penile swelling, scrotal swelling, penile pain and testicular pain.  Neurological: Negative for weakness.  All other systems reviewed and are negative.    Allergies  Review of patient's allergies indicates no known allergies.  Home Medications   Current Outpatient Rx  Name  Route  Sig  Dispense  Refill  . acetaminophen (TYLENOL) 500 MG tablet   Oral   Take 1,000 mg by mouth every 6 (six) hours as needed for pain.         Marland Kitchen ALPRAZolam (XANAX) 1 MG tablet   Oral   Take 1 mg by mouth 2 (two) times daily as needed for anxiety.         Marland Kitchen aspirin 325 MG tablet   Oral   Take 325 mg by mouth every 6 (six) hours as needed for pain.         . Aspirin-Acetaminophen-Caffeine (GOODY HEADACHE PO)   Oral   Take 1 packet by mouth 2 (two) times daily as needed (for headache).         . calcium carbonate (TUMS - DOSED IN MG ELEMENTAL CALCIUM) 500 MG chewable tablet   Oral   Chew 2 tablets by mouth 3 (three) times daily.         . naproxen sodium (ANAPROX) 220 MG tablet   Oral   Take 220 mg by mouth 2 (two) times daily as needed (for pain).         . ranitidine (ZANTAC) 150 MG tablet   Oral   Take 150 mg by mouth 2 (two) times daily as needed for heartburn.         . hydrocortisone (ANUSOL-HC) 25 MG suppository  Rectal   Place 1 suppository (25 mg total) rectally 2 (two) times daily.   12 suppository   0    BP 129/82  Pulse 92  Temp(Src) 98.3 F (36.8 C) (Oral)  Resp 18  SpO2 100% Physical Exam  Constitutional: He is oriented to person, place, and time. He appears well-developed and well-nourished. No distress.  HENT:  Head: Normocephalic and atraumatic.  Mouth/Throat: Oropharynx is clear and moist. No oropharyngeal exudate.  Eyes: Conjunctivae and EOM are normal. Pupils are equal, round, and reactive to light. No scleral icterus.  Neck: Normal range of motion.  Cardiovascular: Normal rate, regular rhythm and normal heart sounds.    Pulmonary/Chest: Effort normal and breath sounds normal. No respiratory distress. He has no wheezes. He has no rales.  Abdominal: Soft. He exhibits no distension and no mass. There is tenderness. There is no rebound and no guarding. Hernia confirmed negative in the right inguinal area and confirmed negative in the left inguinal area.  Genitourinary: Rectum normal, testes normal and penis normal. Rectal exam shows no external hemorrhoid, no internal hemorrhoid, no fissure, no tenderness and anal tone normal. Right testis shows no mass, no swelling and no tenderness. Right testis is descended. Left testis shows no mass, no swelling and no tenderness. Left testis is descended. Circumcised. No penile erythema or penile tenderness. No discharge found.  Chaperoned GU exam with yellowish brown stool and without gross blood. Rectal tone normal.  Musculoskeletal: Normal range of motion.  Lymphadenopathy:       Right: No inguinal adenopathy present.       Left: No inguinal adenopathy present.  Neurological: He is alert and oriented to person, place, and time.  Skin: Skin is warm and dry. No rash noted. He is not diaphoretic. No erythema. No pallor.  Psychiatric: He has a normal mood and affect. His behavior is normal.    ED Course  Procedures (including critical care time) Labs Reviewed  BASIC METABOLIC PANEL - Abnormal; Notable for the following:    Glucose, Bld 116 (*)    All other components within normal limits  URINALYSIS, ROUTINE W REFLEX MICROSCOPIC - Abnormal; Notable for the following:    APPearance CLOUDY (*)    All other components within normal limits  OCCULT BLOOD, POC DEVICE - Abnormal; Notable for the following:    Fecal Occult Bld POSITIVE (*)    All other components within normal limits  GC/CHLAMYDIA PROBE AMP  CBC  OCCULT BLOOD X 1 CARD TO LAB, STOOL   No results found.  1. Heme positive stool   2. Cocaine abuse    MDM  Patient presents for suprapubic abdominal pain x 4  hours, resolved. Patient asymptomatic on initial presentation. Patient endorsing associated small amount of BRBPR on his toilet tissue without blood mixed with his stool or melena. Heme positive stool today; however, no gross blood or melena appreciated on DRE. Likely the result of small anal fissure or internal hemorrhoid. H/H stable. Urine without evidence of infection and chaperoned genital exam without any tenderness or evidence to suspect testicular torsion. No findings c/w epididymitis. GC/Chlamydia culture pending. Reexamination of the abdomen without TTP. Given work up and physical exam, do not believe further work up warranted. Patient appropriate for d/c with PCP follow up and Anusol HC for symptoms. Indications for return provided. Patient agreeable to plan. Patient seen also by Dr. Ignacia Palma who is in agreement with work up and patient's stability for d/c.    Antony Madura, PA-C 03/27/13  0748 

## 2013-03-27 NOTE — ED Notes (Signed)
Pt c/o of blood in his stool. Pt reports bright red blood on the tissue after wiping. Pt also c/o lower abdominal pain that radiates down to his testicles. Pt denies light headedness, dizziness, shortness of breath. Pt is A&Ox4, respirations equal and unlabored, skin warm and dry

## 2013-06-11 ENCOUNTER — Encounter (HOSPITAL_COMMUNITY): Payer: Self-pay | Admitting: *Deleted

## 2013-06-11 ENCOUNTER — Other Ambulatory Visit: Payer: Self-pay

## 2013-06-11 DIAGNOSIS — R0602 Shortness of breath: Secondary | ICD-10-CM | POA: Insufficient documentation

## 2013-06-11 DIAGNOSIS — R079 Chest pain, unspecified: Secondary | ICD-10-CM | POA: Insufficient documentation

## 2013-06-11 DIAGNOSIS — F1410 Cocaine abuse, uncomplicated: Secondary | ICD-10-CM | POA: Insufficient documentation

## 2013-06-11 DIAGNOSIS — F17200 Nicotine dependence, unspecified, uncomplicated: Secondary | ICD-10-CM | POA: Insufficient documentation

## 2013-06-11 LAB — CBC WITH DIFFERENTIAL/PLATELET
Eosinophils Absolute: 0.2 10*3/uL (ref 0.0–0.7)
Eosinophils Relative: 2 % (ref 0–5)
Hemoglobin: 16.2 g/dL (ref 13.0–17.0)
Lymphocytes Relative: 40 % (ref 12–46)
Lymphs Abs: 5 10*3/uL — ABNORMAL HIGH (ref 0.7–4.0)
MCH: 34 pg (ref 26.0–34.0)
MCV: 93.3 fL (ref 78.0–100.0)
Monocytes Relative: 6 % (ref 3–12)
RBC: 4.77 MIL/uL (ref 4.22–5.81)

## 2013-06-11 LAB — COMPREHENSIVE METABOLIC PANEL
Alkaline Phosphatase: 74 U/L (ref 39–117)
BUN: 12 mg/dL (ref 6–23)
CO2: 26 mEq/L (ref 19–32)
GFR calc Af Amer: >90 mL/min (ref >90)
GFR calc non Af Amer: >90 mL/min (ref >90)
Glucose, Bld: 124 mg/dL — ABNORMAL HIGH (ref 70–99)
Potassium: 3.7 mEq/L (ref 3.5–5.1)
Total Protein: 8.7 g/dL — ABNORMAL HIGH (ref 6.0–8.3)

## 2013-06-11 LAB — POCT I-STAT TROPONIN I: Troponin i, poc: 0 ng/mL (ref 0.00–0.08)

## 2013-06-11 NOTE — ED Notes (Signed)
The pt is c/o of mid-chest pain that started about 45 minutes ago.  The pt last did cocaine  Yesterday.   He has  Had chest pain using cocaine in the past.  C/o sob

## 2013-06-11 NOTE — ED Notes (Signed)
The pt is very apprehensive

## 2013-06-12 ENCOUNTER — Emergency Department (HOSPITAL_COMMUNITY)
Admission: EM | Admit: 2013-06-12 | Discharge: 2013-06-12 | Discharge status: Against Medical Advice | Payer: Self-pay | Attending: Emergency Medicine | Admitting: Emergency Medicine

## 2013-06-12 NOTE — ED Notes (Signed)
Called the pt no answer  

## 2013-07-15 ENCOUNTER — Encounter (HOSPITAL_COMMUNITY): Payer: Self-pay | Admitting: Emergency Medicine

## 2013-07-15 ENCOUNTER — Emergency Department (HOSPITAL_COMMUNITY): Payer: Self-pay

## 2013-07-15 ENCOUNTER — Emergency Department (HOSPITAL_COMMUNITY)
Admission: EM | Admit: 2013-07-15 | Discharge: 2013-07-15 | Disposition: A | Discharge status: Home / Self Care | Payer: Self-pay | Attending: Emergency Medicine | Admitting: Emergency Medicine

## 2013-07-15 DIAGNOSIS — Z7982 Long term (current) use of aspirin: Secondary | ICD-10-CM | POA: Insufficient documentation

## 2013-07-15 DIAGNOSIS — R61 Generalized hyperhidrosis: Secondary | ICD-10-CM | POA: Insufficient documentation

## 2013-07-15 DIAGNOSIS — R079 Chest pain, unspecified: Secondary | ICD-10-CM

## 2013-07-15 DIAGNOSIS — F411 Generalized anxiety disorder: Secondary | ICD-10-CM | POA: Insufficient documentation

## 2013-07-15 DIAGNOSIS — IMO0002 Reserved for concepts with insufficient information to code with codable children: Secondary | ICD-10-CM | POA: Insufficient documentation

## 2013-07-15 DIAGNOSIS — R0789 Other chest pain: Secondary | ICD-10-CM | POA: Insufficient documentation

## 2013-07-15 DIAGNOSIS — Z79899 Other long term (current) drug therapy: Secondary | ICD-10-CM | POA: Insufficient documentation

## 2013-07-15 DIAGNOSIS — R000 Tachycardia, unspecified: Secondary | ICD-10-CM | POA: Insufficient documentation

## 2013-07-15 DIAGNOSIS — F1210 Cannabis abuse, uncomplicated: Secondary | ICD-10-CM | POA: Insufficient documentation

## 2013-07-15 DIAGNOSIS — R51 Headache: Secondary | ICD-10-CM | POA: Insufficient documentation

## 2013-07-15 DIAGNOSIS — R110 Nausea: Secondary | ICD-10-CM | POA: Insufficient documentation

## 2013-07-15 DIAGNOSIS — R002 Palpitations: Secondary | ICD-10-CM | POA: Insufficient documentation

## 2013-07-15 DIAGNOSIS — H579 Unspecified disorder of eye and adnexa: Secondary | ICD-10-CM | POA: Insufficient documentation

## 2013-07-15 DIAGNOSIS — F1410 Cocaine abuse, uncomplicated: Secondary | ICD-10-CM | POA: Insufficient documentation

## 2013-07-15 DIAGNOSIS — F17200 Nicotine dependence, unspecified, uncomplicated: Secondary | ICD-10-CM | POA: Insufficient documentation

## 2013-07-15 DIAGNOSIS — F1010 Alcohol abuse, uncomplicated: Secondary | ICD-10-CM | POA: Insufficient documentation

## 2013-07-15 LAB — CBC
Hemoglobin: 17 g/dL (ref 13.0–17.0)
MCH: 34 pg (ref 26.0–34.0)
MCHC: 36.1 g/dL — ABNORMAL HIGH (ref 30.0–36.0)
Platelets: 230 10*3/uL (ref 150–400)
RDW: 12.4 % (ref 11.5–15.5)

## 2013-07-15 LAB — POCT I-STAT TROPONIN I: Troponin i, poc: 0 ng/mL (ref 0.00–0.08)

## 2013-07-15 LAB — BASIC METABOLIC PANEL
BUN: 13 mg/dL (ref 6–23)
Calcium: 10.9 mg/dL — ABNORMAL HIGH (ref 8.4–10.5)
Creatinine, Ser: 1.13 mg/dL (ref 0.50–1.35)
GFR calc Af Amer: >90 mL/min (ref >90)
GFR calc non Af Amer: 82 mL/min — ABNORMAL LOW (ref >90)
Glucose, Bld: 130 mg/dL — ABNORMAL HIGH (ref 70–99)
Potassium: 3.4 mEq/L — ABNORMAL LOW (ref 3.5–5.1)

## 2013-07-15 MED ORDER — LORAZEPAM 2 MG/ML IJ SOLN
1.0000 mg | Freq: Once | INTRAMUSCULAR | Status: AC
  Administered 2013-07-15: 1 mg via INTRAMUSCULAR
  Filled 2013-07-15: qty 1

## 2013-07-15 MED ORDER — POTASSIUM CHLORIDE CRYS ER 20 MEQ PO TBCR
40.0000 meq | EXTENDED_RELEASE_TABLET | Freq: Once | ORAL | Status: AC
  Administered 2013-07-15: 40 meq via ORAL
  Filled 2013-07-15: qty 2

## 2013-07-15 NOTE — ED Notes (Signed)
Patient transported to X-ray 

## 2013-07-15 NOTE — ED Notes (Signed)
While trying to draw pt blood he was trying to stand up and walk around with a needle in his arm. Pt was told to please sit down until blood draw was finished. After blood draw pt was told to please go to the waiting room while waiting for a room and pt became agitated and started walking around saying he was going to go and find his on room and [employer] was called to triage.

## 2013-07-15 NOTE — ED Notes (Signed)
Pt states he drank heavily last night then woke up today and smoked marijuana and started to have racing heart and chest pain. A&Ox4, resp e/u.

## 2013-07-15 NOTE — ED Provider Notes (Signed)
Medical screening examination/treatment/procedure(s) were performed by non-physician practitioner and as supervising physician I was immediately available for consultation/collaboration.  EKG Interpretation     Ventricular Rate:  147 PR Interval:  138 QRS Duration: 80 QT Interval:  262 QTC Calculation: 410 R Axis:   174 Text Interpretation:  Sinus tachycardia Biatrial enlargement Right axis deviation Anterior infarct , age undetermined Abnormal ECG              Layla Maw Ward, DO 07/15/13 2115

## 2013-07-15 NOTE — ED Notes (Signed)
Pt is on phone on arrival to room, irritated about having to give hx and asking for ice chips

## 2013-07-15 NOTE — ED Notes (Addendum)
Pt is now pacing around triage and stating that only god can communicate with him. Pt asked to sit back down to await room available and he continues to pace around triage. [employer] called to stand by, officer currently with the pt

## 2013-07-15 NOTE — ED Provider Notes (Signed)
CSN: [id number]     Arrival date & time 07/15/13  1421 History   First MD Initiated Contact with Patient 07/15/13 1446     Chief Complaint  Patient presents with  . Chest Pain   (Consider location/radiation/quality/duration/timing/severity/associated sxs/prior Treatment) HPI Comments: Patient is a 37 year old male past medical history significant for tobacco and recreational drug use presented to the emergency department for severe worsening chest pressure and burning with associated left-sided headache and "left eye twitching" has been getting progressively worse since last evening. Patient endorses that his pain began after he used cocaine last evening. He also endorses alcohol and marijuana use this morning. He also endorses palpitations. He denies SOB, cough, syncope. Denies history of cardiac events, but does endorse history of recreational drug induced chest pain.  Patient is a 37 y.o. male presenting with chest pain.  Chest Pain Associated symptoms: palpitations     History reviewed. No pertinent past medical history. Past Surgical History  Procedure Laterality Date  . Femur surgery     History reviewed. No pertinent family history. History  Substance Use Topics  . Smoking status: Current Every Day Smoker -- 0.50 packs/day    Types: Cigarettes  . Smokeless tobacco: Not on file  . Alcohol Use: Yes    Review of Systems  Cardiovascular: Positive for chest pain and palpitations.  Psychiatric/Behavioral: Positive for agitation. The patient is nervous/anxious.   All other systems reviewed and are negative.    Allergies  Review of patient's allergies indicates no known allergies.  Home Medications   Current Outpatient Rx  Name  Route  Sig  Dispense  Refill  . acetaminophen (TYLENOL) 500 MG tablet   Oral   Take 1,000 mg by mouth every 6 (six) hours as needed for pain.         Marland Kitchen aspirin 325 MG tablet   Oral   Take 325 mg by mouth every 6 (six) hours as needed for  pain.         . Aspirin-Acetaminophen-Caffeine (GOODY HEADACHE PO)   Oral   Take 1 packet by mouth 2 (two) times daily as needed (for headache).         . calcium carbonate (TUMS - DOSED IN MG ELEMENTAL CALCIUM) 500 MG chewable tablet   Oral   Chew 2 tablets by mouth 3 (three) times daily.         . famotidine (PEPCID AC) 10 MG chewable tablet   Oral   Chew 10 mg by mouth daily as needed for heartburn.         . naproxen sodium (ANAPROX) 220 MG tablet   Oral   Take 220 mg by mouth 2 (two) times daily as needed (for pain).         . ranitidine (ZANTAC) 150 MG tablet   Oral   Take 150 mg by mouth 2 (two) times daily as needed for heartburn.          BP 129/82  Pulse 99  Resp 22  SpO2 98% Physical Exam  Constitutional: He is oriented to person, place, and time. He appears well-developed and well-nourished. No distress.  HENT:  Head: Normocephalic and atraumatic.  Right Ear: External ear normal.  Left Ear: External ear normal.  Nose: Nose normal.  Mouth/Throat: Oropharynx is clear and moist.  Eyes: Conjunctivae are normal.  Neck: Neck supple.  Cardiovascular: Regular rhythm, normal heart sounds and intact distal pulses.  Tachycardia present.  Exam reveals no gallop.   No murmur  heard. Pulmonary/Chest: Effort normal and breath sounds normal. No respiratory distress. He exhibits no tenderness.  Abdominal: Soft. Bowel sounds are normal. There is no tenderness.  Musculoskeletal: Normal range of motion. He exhibits no edema.  Neurological: He is alert and oriented to person, place, and time. Gait normal. GCS eye subscore is 4. GCS verbal subscore is 5. GCS motor subscore is 6.  Skin: Skin is warm and dry. He is not diaphoretic.  Psychiatric: His mood appears anxious. He is agitated.    ED Course  Procedures (including critical care time)  Medications  LORazepam (ATIVAN) injection 1 mg (1 mg Intramuscular Given 07/15/13 1533)  potassium chloride SA  (K-DUR,KLOR-CON) CR tablet 40 mEq (40 mEq Oral Given 07/15/13 1705)    Labs Review Labs Reviewed  BASIC METABOLIC PANEL - Abnormal; Notable for the following:    Potassium 3.4 (*)    Glucose, Bld 130 (*)    Calcium 10.9 (*)    GFR calc non Af Amer 82 (*)    All other components within normal limits  CBC - Abnormal; Notable for the following:    WBC 11.0 (*)    MCHC 36.1 (*)    All other components within normal limits  POCT I-STAT TROPONIN I   Imaging Review Dg Chest 2 View  07/15/2013   CLINICAL DATA:  Chest pain  EXAM: CHEST  2 VIEW  COMPARISON:  Prior radiograph from 02/08/2013  FINDINGS: The cardiac and mediastinal silhouettes are stable in size and contour, and remain within normal limits.  The lungs are normally inflated. No airspace consolidation, pleural effusion, or pulmonary edema is identified. There is no pneumothorax.  No acute osseous abnormality identified.  IMPRESSION: No active cardiopulmonary disease.   Electronically Signed   By: Rise Mu M.D.   On: 07/15/2013 15:11    EKG Interpretation     Ventricular Rate:  147 PR Interval:  138 QRS Duration: 80 QT Interval:  262 QTC Calculation: 410 R Axis:   174 Text Interpretation:  Sinus tachycardia Biatrial enlargement Right axis deviation Anterior infarct , age undetermined Abnormal ECG            MDM   1. Chest pain   2. Cocaine abuse     Afebrile, NAD, non-toxic appearing, AAOx4. Tachycardiac and anxious upon arrival. Tachycardia improved after Ativan administration. Potassium repleted. Patient is to be discharged with recommendation to follow up with PCP in regards to today's hospital visit. Chest pain is not likely of cardiac or pulmonary etiology d/t presentation, perc negative, VSS, no tracheal deviation, no JVD or new murmur, RRR, breath sounds equal bilaterally, EKG without acute abnormalities, negative troponin, and negative CXR. Chest pain likely d/t cocaine and other recreational drug  use. Pt has been advised to discontinue using recreational drugs and return to the ED is CP becomes exertional, associated with diaphoresis or nausea, radiates to left jaw/arm, worsens or becomes concerning in any way. Pt appears reliable for follow up and is agreeable to discharge. Case has been discussed with and seen by Dr. Elesa Massed who agrees with the above plan to discharge. Patient is agreeable to plan. Patient is stable at time of discharge.         Jeannetta Ellis, PA-C 07/15/13 2037

## 2013-07-15 NOTE — ED Notes (Signed)
Security called; pt paranoid; called 911; reports we "know who he is and we are trying to kill him"

## 2013-07-15 NOTE — ED Notes (Signed)
Pt discharge.Vital signs stable and GCS 15.Discharge instruction given.

## 2013-08-29 ENCOUNTER — Emergency Department (HOSPITAL_COMMUNITY)
Admission: EM | Admit: 2013-08-29 | Discharge: 2013-08-29 | Discharge status: Against Medical Advice | Payer: Self-pay | Attending: Emergency Medicine | Admitting: Emergency Medicine

## 2013-08-29 ENCOUNTER — Encounter (HOSPITAL_COMMUNITY): Payer: Self-pay | Admitting: Emergency Medicine

## 2013-08-29 DIAGNOSIS — R569 Unspecified convulsions: Secondary | ICD-10-CM | POA: Insufficient documentation

## 2013-08-29 DIAGNOSIS — F3289 Other specified depressive episodes: Secondary | ICD-10-CM | POA: Insufficient documentation

## 2013-08-29 DIAGNOSIS — F329 Major depressive disorder, single episode, unspecified: Secondary | ICD-10-CM | POA: Insufficient documentation

## 2013-08-29 DIAGNOSIS — F1910 Other psychoactive substance abuse, uncomplicated: Secondary | ICD-10-CM | POA: Insufficient documentation

## 2013-08-29 DIAGNOSIS — F1410 Cocaine abuse, uncomplicated: Secondary | ICD-10-CM | POA: Insufficient documentation

## 2013-08-29 DIAGNOSIS — Z7982 Long term (current) use of aspirin: Secondary | ICD-10-CM | POA: Insufficient documentation

## 2013-08-29 DIAGNOSIS — Z79899 Other long term (current) drug therapy: Secondary | ICD-10-CM | POA: Insufficient documentation

## 2013-08-29 DIAGNOSIS — R51 Headache: Secondary | ICD-10-CM | POA: Insufficient documentation

## 2013-08-29 DIAGNOSIS — F17200 Nicotine dependence, unspecified, uncomplicated: Secondary | ICD-10-CM | POA: Insufficient documentation

## 2013-08-29 HISTORY — DX: Cocaine abuse, uncomplicated: F14.10

## 2013-08-29 HISTORY — DX: Major depressive disorder, single episode, unspecified: F32.9

## 2013-08-29 HISTORY — DX: Unspecified convulsions: R56.9

## 2013-08-29 HISTORY — DX: Depression, unspecified: F32.A

## 2013-08-29 HISTORY — DX: Other psychoactive substance abuse, uncomplicated: F19.10

## 2013-08-29 NOTE — ED Notes (Signed)
No answer x1 at 2200

## 2013-08-29 NOTE — ED Notes (Signed)
Pt. reports headache for 2 days unrelieved by OTC pain medications , denies injury , no nausea or vomitting . No fever or chills.

## 2013-08-29 NOTE — ED Notes (Signed)
No answer x3

## 2013-09-01 ENCOUNTER — Encounter (HOSPITAL_COMMUNITY): Payer: Self-pay | Admitting: Emergency Medicine

## 2013-09-01 ENCOUNTER — Emergency Department (HOSPITAL_COMMUNITY)
Admission: EM | Admit: 2013-09-01 | Discharge: 2013-09-01 | Disposition: A | Discharge status: Home / Self Care | Payer: Self-pay | Attending: Emergency Medicine | Admitting: Emergency Medicine

## 2013-09-01 DIAGNOSIS — F1410 Cocaine abuse, uncomplicated: Secondary | ICD-10-CM | POA: Insufficient documentation

## 2013-09-01 DIAGNOSIS — Z7982 Long term (current) use of aspirin: Secondary | ICD-10-CM | POA: Insufficient documentation

## 2013-09-01 DIAGNOSIS — J019 Acute sinusitis, unspecified: Secondary | ICD-10-CM | POA: Insufficient documentation

## 2013-09-01 DIAGNOSIS — R51 Headache: Secondary | ICD-10-CM | POA: Insufficient documentation

## 2013-09-01 DIAGNOSIS — Z79899 Other long term (current) drug therapy: Secondary | ICD-10-CM | POA: Insufficient documentation

## 2013-09-01 DIAGNOSIS — J329 Chronic sinusitis, unspecified: Secondary | ICD-10-CM

## 2013-09-01 DIAGNOSIS — R112 Nausea with vomiting, unspecified: Secondary | ICD-10-CM | POA: Insufficient documentation

## 2013-09-01 DIAGNOSIS — R6884 Jaw pain: Secondary | ICD-10-CM | POA: Insufficient documentation

## 2013-09-01 DIAGNOSIS — F1910 Other psychoactive substance abuse, uncomplicated: Secondary | ICD-10-CM | POA: Insufficient documentation

## 2013-09-01 DIAGNOSIS — R569 Unspecified convulsions: Secondary | ICD-10-CM | POA: Insufficient documentation

## 2013-09-01 DIAGNOSIS — F17200 Nicotine dependence, unspecified, uncomplicated: Secondary | ICD-10-CM | POA: Insufficient documentation

## 2013-09-01 MED ORDER — KETOROLAC TROMETHAMINE 60 MG/2ML IM SOLN
60.0000 mg | Freq: Once | INTRAMUSCULAR | Status: AC
  Administered 2013-09-01: 60 mg via INTRAMUSCULAR
  Filled 2013-09-01: qty 2

## 2013-09-01 MED ORDER — AZITHROMYCIN 250 MG PO TABS: ORAL_TABLET | ORAL | Status: DC

## 2013-09-01 MED ORDER — DEXAMETHASONE SODIUM PHOSPHATE 10 MG/ML IJ SOLN
10.0000 mg | Freq: Once | INTRAMUSCULAR | Status: AC
  Administered 2013-09-01: 10 mg via INTRAMUSCULAR
  Filled 2013-09-01: qty 1

## 2013-09-01 NOTE — ED Notes (Signed)
Pt c/o right sided HA x 1 week; pt admits to cocaine use 1 week ago

## 2013-09-01 NOTE — ED Provider Notes (Signed)
CSN: [id number]     Arrival date & time 09/01/13  1232 History   First MD Initiated Contact with Patient 09/01/13 1500     Chief Complaint  Patient presents with  . Headache   (Consider location/radiation/quality/duration/timing/severity/associated sxs/prior Treatment) The history is provided by the patient.     This is a 37 year old male with a past medical history of cocaine abuse, polysubstance abuse, depression and seizures who presents to the emergency department with chief complaint of headache. The patient states that one week ago after snorting cocaine he blew his nose and developed a headache. He has had a progressively worsening headache on the R side in the region of the frontal and maxillary sinus which radiates into hi R ear. He has associated pain in the right ear with muffling of noise and a pressure-like sensation.  Patient also complains of some pain in his right jaw.  He denies any dental pain, fevers, stiff neck, rash.  The patient states that he has been using Aleve and Advil with some relief.  He states that last night he awoke with his headache.  He became very nauseated and vomited once.  Afterwards he had relief of symptoms and a decrease in his headache pain. Denies photophobia, phonophobia,  visual changes.  Patient has no history of headaches.  States that he thinks it is a sinus infection. Past Medical History  Diagnosis Date  . Cocaine abuse   . Depression   . Seizures   . Polysubstance abuse    Past Surgical History  Procedure Laterality Date  . Femur surgery     History reviewed. No pertinent family history. History  Substance Use Topics  . Smoking status: Current Every Day Smoker -- 0.50 packs/day    Types: Cigarettes  . Smokeless tobacco: Not on file  . Alcohol Use: Yes    Review of Systems Ten systems reviewed and are negative for acute change, except as noted in the HPI.   Allergies  Review of patient's allergies indicates no known  allergies.  Home Medications   Current Outpatient Rx  Name  Route  Sig  Dispense  Refill  . aspirin 325 MG tablet   Oral   Take 650 mg by mouth every 6 (six) hours as needed for mild pain or headache.          . calcium carbonate (TUMS - DOSED IN MG ELEMENTAL CALCIUM) 500 MG chewable tablet   Oral   Chew 4 tablets by mouth 2 (two) times daily.          . naproxen sodium (ANAPROX) 220 MG tablet   Oral   Take 440 mg by mouth 2 (two) times daily as needed (for headache, pain).          BP 125/91  Pulse 71  Temp(Src) 97.9 F (36.6 C) (Oral)  Resp 16  Wt 155 lb (70.308 kg)  SpO2 100% Physical Exam  Nursing note and vitals reviewed. Constitutional: He appears well-developed and well-nourished. No distress.  HENT:  Head: Normocephalic and atraumatic.  Right Ear: Tympanic membrane normal.  Left Ear: Tympanic membrane normal.  Nose: No mucosal edema, rhinorrhea, sinus tenderness or nasal deformity. No epistaxis. Right sinus exhibits maxillary sinus tenderness and frontal sinus tenderness. Left sinus exhibits no maxillary sinus tenderness and no frontal sinus tenderness.  Mouth/Throat: Uvula is midline, oropharynx is clear and moist and mucous membranes are normal.  Eyes: Conjunctivae are normal. No scleral icterus.  Neck: Normal range of motion. Neck supple.  Cardiovascular: Normal rate, regular rhythm and normal heart sounds.   Pulmonary/Chest: Effort normal and breath sounds normal. No respiratory distress.  Abdominal: Soft. There is no tenderness.  Musculoskeletal: He exhibits no edema.  Neurological: He is alert.  Speech is clear and goal oriented, follows commands Major Cranial nerves without deficit, no facial droop Normal strength in upper and lower extremities bilaterally including dorsiflexion and plantar flexion, strong and equal grip strength Sensation normal to light and sharp touch Moves extremities without ataxia, coordination intact Normal finger to nose and  rapid alternating movements Neg romberg, no pronator drift Normal gait Normal heel-shin and balance   Skin: Skin is warm and dry. He is not diaphoretic.  Psychiatric: His behavior is normal.    ED Course  Procedures (including critical care time) Labs Review Labs Reviewed - No data to display Imaging Review No results found.  EKG Interpretation   None       MDM  No diagnosis found. Filed Vitals:   09/01/13 1236 09/01/13 1450 09/01/13 1557  BP: 132/92 125/91 116/85  Pulse: 77 71 73  Temp: 97.9 F (36.6 C)  98.5 F (36.9 C)  TempSrc: Oral  Oral  Resp: 18 16 16   Weight: 155 lb (70.308 kg)    SpO2: 94% 100% 99%    Patient with Headache, This is likely a sinus infection. Initial blood pressure elevated however trended down.  The patient states he has not used cocaine in one week to 2 problems with his nose and headache.  Patient is without any neurologic abnormality.  His headache is moderate and has decreased since this morning.  The patient has not taken any medications to alleviate his pain today.  The patient is receiving IM Decadron and Toradol.  Offered by mouth fluids.  I do not feel this is subarachnoid hemorrhage, meningitis or other emergent type of headache.  Will reevaluate the patient after interventions are tried.  4:21 PM BP 112/86  Pulse 72  Temp(Src) 98.5 F (36.9 C) (Oral)  Resp 16  Wt 155 lb (70.308 kg)  SpO2 97% Pt HA treated and improved while in ED.  Presentation is like pts typical HA and non concerning for [facility], ICH, Meningitis, or temporal arteritis. Pt is afebrile with no focal neuro deficits, nuchal rigidity, or change in vision. Will d/c with [facility] for suspected sinus infection. Pt verbalizes understanding and is agreeable with plan to dc.    Arthor Captain, PA-C 09/01/13 1622

## 2013-09-01 NOTE — ED Notes (Signed)
Pt in c/o intermittent headache over the last week, states he was seen for same two days ago and pain resolved on its own, the pain comes on suddenly during the day and usually resolves itself, pain to right side of head and ear

## 2013-09-02 NOTE — ED Provider Notes (Signed)
Medical screening examination/treatment/procedure(s) were performed by non-physician practitioner and as supervising physician I was immediately available for consultation/collaboration.  EKG Interpretation   None         Glynn Octave, MD 09/02/13 0040

## 2013-09-06 ENCOUNTER — Encounter (HOSPITAL_COMMUNITY): Payer: Self-pay | Admitting: Emergency Medicine

## 2013-09-06 DIAGNOSIS — F17200 Nicotine dependence, unspecified, uncomplicated: Secondary | ICD-10-CM | POA: Insufficient documentation

## 2013-09-06 DIAGNOSIS — R51 Headache: Secondary | ICD-10-CM | POA: Insufficient documentation

## 2013-09-06 NOTE — ED Notes (Addendum)
Pt states that he has been treated for a sinus infection and that he took all of his antibiotics and he is still having a HA. Pt states HA for the last 7 days. Pt has no neuro deficits.

## 2013-09-07 ENCOUNTER — Emergency Department (HOSPITAL_COMMUNITY)
Admission: EM | Admit: 2013-09-07 | Discharge: 2013-09-07 | Discharge status: Against Medical Advice | Payer: Self-pay | Attending: Emergency Medicine | Admitting: Emergency Medicine

## 2013-09-07 HISTORY — DX: Chronic sinusitis, unspecified: J32.9

## 2013-09-16 ENCOUNTER — Encounter (HOSPITAL_COMMUNITY): Payer: Self-pay | Admitting: Emergency Medicine

## 2013-09-16 ENCOUNTER — Emergency Department (INDEPENDENT_AMBULATORY_CARE_PROVIDER_SITE_OTHER)
Admission: EM | Admit: 2013-09-16 | Discharge: 2013-09-16 | Disposition: A | Discharge status: Home / Self Care | Payer: Self-pay | Source: Home / Self Care | Attending: Family Medicine | Admitting: Family Medicine

## 2013-09-16 DIAGNOSIS — H6120 Impacted cerumen, unspecified ear: Secondary | ICD-10-CM

## 2013-09-16 DIAGNOSIS — H6121 Impacted cerumen, right ear: Secondary | ICD-10-CM

## 2013-09-16 MED ORDER — ANTIPYRINE-BENZOCAINE 5.4-1.4 % OT SOLN: 3.0000 [drp] | OTIC | Status: DC | PRN

## 2013-09-16 NOTE — ED Provider Notes (Signed)
CSN: [id number]     Arrival date & time 09/16/13  1747 History   First MD Initiated Contact with Patient 09/16/13 1841     No chief complaint on file.  (Consider location/radiation/quality/duration/timing/severity/associated sxs/prior Treatment) Patient is a 38 y.o. male presenting with ear pain. The history is provided by the patient.  Otalgia Location:  Right Quality:  Sharp and shooting Severity:  Moderate Duration:  1 day Timing:  Intermittent Chronicity:  New Context: not direct blow, not elevation change, not foreign body in ear, not loud noise and no water in ear   Relieved by:  None tried Worsened by:  Nothing tried Ineffective treatments:  None tried Associated symptoms: congestion   Associated symptoms: no ear discharge, no fever, no hearing loss and no tinnitus     Past Medical History  Diagnosis Date  . Cocaine abuse   . Depression   . Seizures   . Polysubstance abuse   . Sinus infection    Past Surgical History  Procedure Laterality Date  . Femur surgery     No family history on file. History  Substance Use Topics  . Smoking status: Current Every Day Smoker -- 0.50 packs/day    Types: Cigarettes  . Smokeless tobacco: Not on file  . Alcohol Use: Yes    Review of Systems  Constitutional: Negative for fever.  HENT: Positive for congestion and ear pain. Negative for ear discharge, hearing loss and tinnitus.   Respiratory: Negative.   Cardiovascular: Negative.   Gastrointestinal: Negative.     Allergies  Review of patient's allergies indicates no known allergies.  Home Medications   Current Outpatient Rx  Name  Route  Sig  Dispense  Refill  . aspirin 325 MG tablet   Oral   Take 650 mg by mouth every 6 (six) hours as needed for mild pain or headache.          Marland Kitchen. azithromycin (ZITHROMAX Z-PAK) 250 MG tablet      2 po day one, then 1 daily x 4 days   5 tablet   0   . calcium carbonate (TUMS - DOSED IN MG ELEMENTAL CALCIUM) 500 MG chewable  tablet   Oral   Chew 4 tablets by mouth 2 (two) times daily.          . naproxen sodium (ANAPROX) 220 MG tablet   Oral   Take 440 mg by mouth 2 (two) times daily as needed (for headache, pain).          BP 128/68  Pulse 79  Temp(Src) 97.6 F (36.4 C) (Oral)  Resp 16  SpO2 100% Physical Exam  Nursing note and vitals reviewed. Constitutional: He is oriented to person, place, and time.  HENT:  Head: Normocephalic and atraumatic.  Left Ear: Hearing, tympanic membrane, external ear and ear canal normal.  Nose: Nose normal.  Mouth/Throat: Uvula is midline, oropharynx is clear and moist and mucous membranes are normal. No oral lesions. No trismus in the jaw. Dental caries present. No dental abscesses.  +cerumen impaction right ear. No mastoid tenderness  Eyes: Conjunctivae are normal. Right eye exhibits no discharge. Left eye exhibits no discharge. No scleral icterus.  Neck: Normal range of motion. Neck supple. No thyromegaly present.  Cardiovascular: Normal rate, regular rhythm and normal heart sounds.   Pulmonary/Chest: Effort normal and breath sounds normal.  Lymphadenopathy:    He has no cervical adenopathy.  Neurological: He is alert and oriented to person, place, and time.  Skin: Skin  is warm and dry. No rash noted.    ED Course  Procedures (including critical care time) Labs Review Labs Reviewed - No data to display Imaging Review No results found.  EKG Interpretation    Date/Time:    Ventricular Rate:    PR Interval:    QRS Duration:   QT Interval:    QTC Calculation:   R Axis:     Text Interpretation:              MDM  Right ear re-examined after irrigation. TM and canal normal. Patient reports right ear pain to have resolved after cerumen impaction cleared.   Jess Barters Nikolai, [place] 09/17/13 [phone]

## 2013-09-16 NOTE — ED Notes (Signed)
C/o sinus pressure and pain.  Sore throat.  Pain in right ear.  Pt has tried otc meds for pain and recently finished antibiotic for cold symptoms with no relief.  On going x 2 wks.  States felt feverish today.

## 2013-09-16 NOTE — Discharge Instructions (Signed)
Cerumen Impaction A cerumen impaction is when the wax in your ear forms a plug. This plug usually causes reduced hearing. Sometimes it also causes an earache or dizziness. Removing a cerumen impaction can be difficult and painful. The wax sticks to the ear canal. The canal is sensitive and bleeds easily. If you try to remove a heavy wax buildup with a cotton tipped swab, you may push it in further. Irrigation with water, suction, and small ear curettes may be used to clear out the wax. If the impaction is fixed to the skin in the ear canal, ear drops may be needed for a few days to loosen the wax. People who build up a lot of wax frequently can use ear wax removal products available in your local drugstore. SEEK MEDICAL CARE IF:  You develop an earache, increased hearing loss, or marked dizziness. Document Released: 10/05/2004 Document Revised: 11/20/2011 Document Reviewed: 11/25/2009 [employer] Patient Information 2014 [employer].  

## 2013-09-21 NOTE — ED Provider Notes (Signed)
Medical screening examination/treatment/procedure(s) were performed by resident physician or non-physician practitioner and as supervising physician I was immediately available for consultation/collaboration.   KINDL,JAMES DOUGLAS MD.   James D Kindl, MD 09/21/13 1036 

## 2013-10-23 ENCOUNTER — Emergency Department (HOSPITAL_COMMUNITY): Payer: Self-pay

## 2013-10-23 ENCOUNTER — Encounter (HOSPITAL_COMMUNITY): Payer: Self-pay | Admitting: Emergency Medicine

## 2013-10-23 ENCOUNTER — Emergency Department (HOSPITAL_COMMUNITY)
Admission: EM | Admit: 2013-10-23 | Discharge: 2013-10-23 | Disposition: A | Discharge status: Home / Self Care | Payer: Self-pay | Attending: Emergency Medicine | Admitting: Emergency Medicine

## 2013-10-23 DIAGNOSIS — Z8659 Personal history of other mental and behavioral disorders: Secondary | ICD-10-CM | POA: Insufficient documentation

## 2013-10-23 DIAGNOSIS — F17200 Nicotine dependence, unspecified, uncomplicated: Secondary | ICD-10-CM | POA: Insufficient documentation

## 2013-10-23 DIAGNOSIS — Z79899 Other long term (current) drug therapy: Secondary | ICD-10-CM | POA: Insufficient documentation

## 2013-10-23 DIAGNOSIS — Z7982 Long term (current) use of aspirin: Secondary | ICD-10-CM | POA: Insufficient documentation

## 2013-10-23 DIAGNOSIS — Z8669 Personal history of other diseases of the nervous system and sense organs: Secondary | ICD-10-CM | POA: Insufficient documentation

## 2013-10-23 DIAGNOSIS — R079 Chest pain, unspecified: Secondary | ICD-10-CM | POA: Insufficient documentation

## 2013-10-23 DIAGNOSIS — F1410 Cocaine abuse, uncomplicated: Secondary | ICD-10-CM | POA: Insufficient documentation

## 2013-10-23 LAB — BASIC METABOLIC PANEL
BUN: 13 mg/dL (ref 6–23)
CALCIUM: 10.4 mg/dL (ref 8.4–10.5)
CHLORIDE: 99 meq/L (ref 96–112)
CO2: 25 mEq/L (ref 19–32)
CREATININE: 1.18 mg/dL (ref 0.50–1.35)
GFR calc Af Amer: 90 mL/min — ABNORMAL LOW (ref >90)
GFR calc non Af Amer: 77 mL/min — ABNORMAL LOW (ref >90)
Glucose, Bld: 109 mg/dL — ABNORMAL HIGH (ref 70–99)
Potassium: 3.8 mEq/L (ref 3.7–5.3)
Sodium: 138 mEq/L (ref 137–147)

## 2013-10-23 LAB — POCT I-STAT TROPONIN I: Troponin i, poc: 0.01 ng/mL (ref 0.00–0.08)

## 2013-10-23 LAB — PRO B NATRIURETIC PEPTIDE: Pro B Natriuretic peptide (BNP): <5 pg/mL (ref 0–125)

## 2013-10-23 LAB — CBC
HEMATOCRIT: 40.7 % (ref 39.0–52.0)
Hemoglobin: 14.7 g/dL (ref 13.0–17.0)
MCH: 34.3 pg — AB (ref 26.0–34.0)
MCHC: 36.1 g/dL — ABNORMAL HIGH (ref 30.0–36.0)
MCV: 94.9 fL (ref 78.0–100.0)
Platelets: 172 10*3/uL (ref 150–400)
RBC: 4.29 MIL/uL (ref 4.22–5.81)
RDW: 12.2 % (ref 11.5–15.5)
WBC: 8.8 10*3/uL (ref 4.0–10.5)

## 2013-10-23 LAB — CG4 I-STAT (LACTIC ACID): Lactic Acid, Venous: 1.91 mmol/L (ref 0.5–2.2)

## 2013-10-23 MED ORDER — GI COCKTAIL ~~LOC~~
30.0000 mL | Freq: Once | ORAL | Status: AC
  Administered 2013-10-23: 30 mL via ORAL
  Filled 2013-10-23: qty 30

## 2013-10-23 MED ORDER — SODIUM CHLORIDE 0.9 % IV SOLN
1000.0000 mL | Freq: Once | INTRAVENOUS | Status: AC
  Administered 2013-10-23: 1000 mL via INTRAVENOUS

## 2013-10-23 MED ORDER — SODIUM CHLORIDE 0.9 % IV SOLN: 1000.0000 mL | INTRAVENOUS | Status: DC

## 2013-10-23 MED ORDER — KETOROLAC TROMETHAMINE 30 MG/ML IJ SOLN
30.0000 mg | Freq: Once | INTRAMUSCULAR | Status: AC
  Administered 2013-10-23: 30 mg via INTRAVENOUS
  Filled 2013-10-23: qty 1

## 2013-10-23 NOTE — Discharge Instructions (Signed)
Do not use cocaine-it can kill you.  Take ibuprofen or naproxen as needed for pain.  Chest Pain (Nonspecific) It is often hard to give a specific diagnosis for the cause of chest pain. There is always a chance that your pain could be related to something serious, such as a heart attack or a blood clot in the lungs. You need to follow up with your caregiver for further evaluation. CAUSES   Heartburn.  Pneumonia or bronchitis.  Anxiety or stress.  Inflammation around your heart (pericarditis) or lung (pleuritis or pleurisy).  A blood clot in the lung.  A collapsed lung (pneumothorax). It can develop suddenly on its own (spontaneous pneumothorax) or from injury (trauma) to the chest.  Shingles infection (herpes zoster virus). The chest wall is composed of bones, muscles, and cartilage. Any of these can be the source of the pain.  The bones can be bruised by injury.  The muscles or cartilage can be strained by coughing or overwork.  The cartilage can be affected by inflammation and become sore (costochondritis). DIAGNOSIS  Lab tests or other studies, such as X-rays, electrocardiography, stress testing, or cardiac imaging, may be needed to find the cause of your pain.  TREATMENT   Treatment depends on what may be causing your chest pain. Treatment may include:  Acid blockers for heartburn.  Anti-inflammatory medicine.  Pain medicine for inflammatory conditions.  Antibiotics if an infection is present.  You may be advised to change lifestyle habits. This includes stopping smoking and avoiding alcohol, caffeine, and chocolate.  You may be advised to keep your head raised (elevated) when sleeping. This reduces the chance of acid going backward from your stomach into your esophagus.  Most of the time, nonspecific chest pain will improve within 2 to 3 days with rest and mild pain medicine. HOME CARE INSTRUCTIONS   If antibiotics were prescribed, take your antibiotics as  directed. Finish them even if you start to feel better.  For the next few days, avoid physical activities that bring on chest pain. Continue physical activities as directed.  Do not smoke.  Avoid drinking alcohol.  Only take over-the-counter or prescription medicine for pain, discomfort, or fever as directed by your caregiver.  Follow your caregiver's suggestions for further testing if your chest pain does not go away.  Keep any follow-up appointments you made. If you do not go to an appointment, you could develop lasting (chronic) problems with pain. If there is any problem keeping an appointment, you must call to reschedule. SEEK MEDICAL CARE IF:   You think you are having problems from the medicine you are taking. Read your medicine instructions carefully.  Your chest pain does not go away, even after treatment.  You develop a rash with blisters on your chest. SEEK IMMEDIATE MEDICAL CARE IF:   You have increased chest pain or pain that spreads to your arm, neck, jaw, back, or abdomen.  You develop shortness of breath, an increasing cough, or you are coughing up blood.  You have severe back or abdominal pain, feel nauseous, or vomit.  You develop severe weakness, fainting, or chills.  You have a fever. THIS IS AN EMERGENCY. Do not wait to see if the pain will go away. Get medical help at once. Call your local emergency services (911 in U.S.). Do not drive yourself to the hospital. MAKE SURE YOU:   Understand these instructions.  Will watch your condition.  Will get help right away if you are not doing well  or get worse. Document Released: 06/07/2005 Document Revised: 11/20/2011 Document Reviewed: 04/02/2008 [facility] Patient Information 2014 [place], [place].  Polysubstance Abuse When people abuse more than one drug or type of drug it is called polysubstance or polydrug abuse. For example, many smokers also drink alcohol. This is one form of polydrug abuse. Polydrug abuse  also refers to the use of a drug to counteract an unpleasant effect produced by another drug. It may also be used to help with withdrawal from another drug. People who take stimulants may become agitated. Sometimes this agitation is countered with a tranquilizer. This helps protect against the unpleasant side effects. Polydrug abuse also refers to the use of different drugs at the same time.  Anytime drug use is interfering with normal living activities, it has become abuse. This includes problems with family and friends. Psychological dependence has developed when your mind tells you that the drug is needed. This is usually followed by physical dependence which has developed when continuing increases of drug are required to get the same feeling or "high". This is known as addiction or chemical dependency. A person's risk is much higher if there is a history of chemical dependency in the family. SIGNS OF CHEMICAL DEPENDENCY  You have been told by friends or family that drugs have become a problem.  You fight when using drugs.  You are having blackouts (not remembering what you do while using).  You feel sick from using drugs but continue using.  You lie about use or amounts of drugs (chemicals) used.  You need chemicals to get you going.  You are suffering in work performance or in school because of drug use.  You get sick from use of drugs but continue to use anyway.  You need drugs to relate to people or feel comfortable in social situations.  You use drugs to forget problems. "Yes" answered to any of the above signs of chemical dependency indicates there are problems. The longer the use of drugs continues, the greater the problems will become. If there is a family history of drug or alcohol use, it is best not to experiment with these drugs. Continual use leads to tolerance. After tolerance develops more of the drug is needed to get the same feeling. This is followed by addiction. With  addiction, drugs become the most important part of life. It becomes more important to take drugs than participate in the other usual activities of life. This includes relating to friends and family. Addiction is followed by dependency. Dependency is a condition where drugs are now needed not just to get high, but to feel normal. Addiction cannot be cured but it can be stopped. This often requires outside help and the care of professionals. Treatment centers are listed in the yellow pages under: Cocaine, Narcotics, and Alcoholics Anonymous. Most hospitals and clinics can refer you to a specialized care center. Talk to your caregiver if you need help. Document Released: 04/19/2005 Document Revised: 11/20/2011 Document Reviewed: 08/28/2005 [facility] Patient Information 2014 [place], [place].   Emergency Department Resource Guide 1) Find a Doctor and Pay Out of Pocket Although you won't have to find out who is covered by your insurance plan, it is a good idea to ask around and get recommendations. You will then need to call the office and see if the doctor you have chosen will accept you as a new patient and what types of options they offer for patients who are self-pay. Some doctors offer discounts or will set up  payment plans for their patients who do not have insurance, but you will need to ask so you aren't surprised when you get to your appointment.  2) Contact Your Local Health Department Not all health departments have doctors that can see patients for sick visits, but many do, so it is worth a call to see if yours does. If you don't know where your local health department is, you can check in your phone book. The CDC also has a tool to help you locate your state's health department, and many state websites also have listings of all of their local health departments.  3) Find a Walk-in Clinic If your illness is not likely to be very severe or complicated, you may want to try a walk in clinic. These  are popping up all over the country in pharmacies, drugstores, and shopping centers. They're usually staffed by nurse practitioners or physician assistants that have been trained to treat common illnesses and complaints. They're usually fairly quick and inexpensive. However, if you have serious medical issues or chronic medical problems, these are probably not your best option.  No Primary Care Doctor: - Call Health Connect at  [phone] - they can help you locate a primary care doctor that  accepts your insurance, provides certain services, etc. - Physician Referral Service- [phone]  Chronic Pain Problems: Organization         Address  Phone   Notes  [facility]  [phone] Patients need to be referred by their primary care doctor.   Medication Assistance: Organization         Address  Phone   Notes  [facility] Medication [facility] [address] [place], [place] [address] [phone] --Must be a resident of [facility] -- Must have NO insurance coverage whatsoever (no Medicaid/ Medicare, etc.) -- The pt. MUST have a primary care doctor that directs their care regularly and follows them in the community   [facility]  [phone]   [employer]  [phone]    Agencies that provide inexpensive medical care: Organization         Address  Phone   Notes  [facility]  [phone]   [facility]    [phone]   [facility] [address] [place], [place] [address] [phone]   [facility] [address], [place] [phone]   [facility]anned [facility]    [phone]) [phone]   [facility]    [phone]   [facility]  [address], [place] Phone:  [phone], Fax:  [phone] Hours of Operation:  9 am - 6 pm, M-F.  Also accepts Medicaid/Medicare and self-pay.  [facility]   [address], [place] Phone: [phone], Fax: [phone]. Hours of Operation:  8:30 am - 5:30 pm, M-F.  Also accepts Medicaid and self-pay.  [facility] [place] [address], [place] Phone: [phone]   [facility] [address], [place], [place] [phone], Ext. [phone] Mondays & Thursdays: 7-9 AM.  First 15 patients are seen on a first come, first serve basis.    Medicaid-accepting [facility] Providers:  Organization         Address  Phone   Notes  [facility] [address],  [phone] Also accepts  self-pay patients.  [facility] [address] Laurell Josephs [place], [place]  [phone]   [facility] [address], [place] [phone]   [facility] [address], [place] [phone]   Renaye Rakers [address] [place] [address], [place]   [phone] Only accepts [place] Access [place] patients after they have their name applied to their card.   Self-Pay (no insurance) in [facility]:  Organization         Address  Phone   Notes  Sickle Cell Patients, [facility] [address] [place], [place] [phone]   [facility] [address] [place], [place] [phone]   [facility]  [address], [place] [phone]   [facility]/Dr. Osei-Bonsu  [address], [place] or [address], [place] [phone] Phone number for both [place] and [place] locations is the same.  [facility] [address] [place] [phone]   [facility] [address] [place] or [address] [phone]   [facility] [address] [place] [phone], phone; [phone], fax Sees patients 1st and 3rd  Saturday of every month.  Must not qualify for public or private insurance (i.e. Medicaid, Medicare, [place] Health Choice, Veterans' Benefits)  Household income should be no more than 200% of the poverty level The clinic cannot treat you if you are pregnant or think you are pregnant  Sexually transmitted diseases are not treated at the clinic.    Dental Care: Organization         Address  Phone  Notes  [facility] Department of [facility] [address] [place], [place] [phone] Accepts children up to age 40 who are enrolled in [place] or [place] Health Choice; pregnant women with a Medicaid card; and children who have applied for Medicaid or [place] Health Choice, but were declined, whose parents can pay a reduced fee at time of service.  [facility]  [address], [place] [phone] Accepts children up to age 27 who are enrolled in [place] or [place] Health Choice; pregnant women with a Medicaid card; and children who have applied for Medicaid or [place] Health Choice, but were declined, whose parents can pay a reduced fee at time of service.  [facility]  [address] [place], [place] [phone] Patients are seen by appointment only. Walk-ins are not accepted. [facility] will see patients 68 years of age and older. Monday - Tuesday (8am-5pm) Most Wednesdays (8:30-5pm) $30 per visit, cash only  [facility] Adult Dental Access PROGRAM  [address], [facility] [phone] Patients are seen by appointment only. Walk-ins are not accepted. [facility] will see patients 62 years of age and older. One Wednesday Evening (Monthly: Volunteer Based).  $30 per visit, cash only  [employer]  [phone] for adults; Children under age 61, call Graduate Pediatric Dentistry at [phone]. Children aged 17-14, please call [phone] to request a pediatric  application.  Dental services are provided in all areas of dental care including fillings, crowns and bridges, complete and partial dentures, implants, gum treatment, root canals, and extractions. Preventive care is also provided. Treatment is provided  to both adults and children. Patients are selected via a lottery and there is often a waiting list.   [facility] [address], [place]  [phone] [email or web address]   [facility] [address] [place], [place] [phone], Ext. [phone] Second and Fourth Thursday of each month, opens at 6:30 AM; Clinic ends at 9 AM.  Patients are seen on a first-come first-served basis, and a limited number are seen during each clinic.   [facility]  [address] Ether Griffins [place], [place] [phone]   Eligibility Requirements You must have lived in [place], [place], or [place] counties for at least the last three months.   You cannot be eligible for state or federal sponsored [place], including [employer], [place], or [employer].   You generally cannot be eligible for healthcare insurance through your employer.    How to apply: Eligibility screenings are held every Tuesday and Wednesday afternoon from 1:00 pm until 4:00 pm. You do not need an appointment for the interview!  [facility] [address] [place], [place] [phone]   [facility] Health Department  [phone]   [facility] Health Department  [phone]   [facility] Health Department  [phone]    Behavioral Health Resources in the Community: Intensive Outpatient Programs Organization         Address  Phone  Notes  [facility] [address] [place], [place] [phone]   [facility] [address], [place], [place] [phone]   [facility] [address], [place], [place]  [phone]   [facility]  [address],  [place], [place] [phone] or [phone]   Substance Abuse Resources Organization         Address  Phone  Notes  [facility]  [phone]   [facility]  [phone]   [facility] [place]  [phone]   Floydene Flock  [phone]   Residential & Outpatient Substance Abuse Program  [phone]   Psychological Services Organization         Address  Phone  Notes  [facility]  [phone]   [facility]  [phone]   [facility] [address] [place] [phone] or [phone]    Mobile Crisis Teams Organization         Address  Phone  Notes  [facility], Mobile Crisis Care Unit  [phone]   [facility]  [address] [place], [place] [phone]   Doristine Locks [address] [place] [phone]    Self-Help/Support Groups Organization         Address  Phone             Notes  [facility] [place] - variety of support groups  [phone]- [phone] Call for more information  Narcotics Anonymous (NA), [facility] [address], [employer] [place]  2 meetings at this location   Statistician         Address  Phone  Notes  [facility] [address],    [place]  [phone]   [facility]  [address], [place] [address], [place], [place] [phone]   [facility] [address] [place], [place] [phone] Admissions: 8am-3pm M-F  [facility] [address]    [place], [place] [phone]  [facility] [address] [place], [place] [phone]   [facility] [address],  [place], [place] [phone]   [facility] - Intensive Outpatient [address] Laure[address] Joseph[address], [place], [place] [phone]   [facility] ([facility] [address]    [place], [place] [phone] or [phone]   [facility] ([facility]) [address] [place], [place] [phone] Accepts Medicaid  [facility] [address],  [place] [phone] Substance Abuse/Addiction Treatment   [facility] Organization         Address  Phone  Notes  [facility]  [phone]   Angie Fava, PhD [address] Ervin Knack [place], [place]   [phone] or [phone]   [facility] Behavioral   [address] [place], [place] [phone]   [facility] [address], [place], [place] [phone] Insurance/Medicaid/sponsorship through [facility] and Families [address]                                    [place], [place] [phone] Therapy/tele-psych/case  [facility] [address], [place] [phone]    Dr. Lolly Mustache  [phone]   [facility]  [employer] [facility] Dept. 1) [address] [place] [address] [place] 2) [address], [place] 3)  [address], [place] [phone]  [phone]   [facility] Child Abuse Hotline [phone] or [phone] (After Hours)

## 2013-10-23 NOTE — ED Provider Notes (Signed)
CSN: [id number]     Arrival date & time 10/23/13  0032 History   First MD Initiated Contact with Patient 10/23/13 0111     Chief Complaint  Patient presents with  . Chest Pain     (Consider location/radiation/quality/duration/timing/severity/associated sxs/prior Treatment) Patient is a 38 y.o. male presenting with chest pain. The history is provided by the patient.  Chest Pain He started having chest pain this evening which is described as a dull pain in the left upper chest. Onset was at rest. Nothing makes it better nothing makes it worse. There is mild dyspnea but no nausea or diaphoresis. Pain has been improving since he arrived in the ED. It was rated at 7/10 initially and is now down to 5/10. He has had similar pain before. He denies history of hypertension, diabetes, hyperlipidemia and denies family history of coronary artery disease. He does smoke half pack of cigarettes a day and abuses cocaine. Last cocaine use was more than 24 hours ago. He does state that he thinks he may have occurred at end systole use antacids frequently after eating.  Past Medical History  Diagnosis Date  . Cocaine abuse   . Depression   . Seizures   . Polysubstance abuse   . Sinus infection    Past Surgical History  Procedure Laterality Date  . Femur surgery     No family history on file. History  Substance Use Topics  . Smoking status: Current Every Day Smoker -- 0.50 packs/day    Types: Cigarettes  . Smokeless tobacco: Not on file  . Alcohol Use: Yes    Review of Systems  Cardiovascular: Positive for chest pain.  All other systems reviewed and are negative.      Allergies  Review of patient's allergies indicates no known allergies.  Home Medications   Current Outpatient Rx  Name  Route  Sig  Dispense  Refill  . antipyrine-benzocaine (AURALGAN) otic solution   Right Ear   Place 3-4 drops into the right ear every 2 (two) hours as needed for ear pain.   10 mL   0   . aspirin 325  MG tablet   Oral   Take 650 mg by mouth every 6 (six) hours as needed for mild pain or headache.          Marland Kitchen azithromycin (ZITHROMAX Z-PAK) 250 MG tablet      2 po day one, then 1 daily x 4 days   5 tablet   0   . calcium carbonate (TUMS - DOSED IN MG ELEMENTAL CALCIUM) 500 MG chewable tablet   Oral   Chew 4 tablets by mouth 2 (two) times daily.          . naproxen sodium (ANAPROX) 220 MG tablet   Oral   Take 440 mg by mouth 2 (two) times daily as needed (for headache, pain).          BP 127/90  Pulse 103  Temp(Src) 98.7 F (37.1 C) (Oral)  Resp 16  SpO2 98% Physical Exam  Nursing note and vitals reviewed.  38 year old male, resting comfortably and in no acute distress. Vital signs are significant for tachycardia with heart rate 110, and hypertension with blood pressure 153/95. Oxygen saturation is 98%, which is normal. Head is normocephalic and atraumatic. PERRLA, EOMI. Oropharynx is clear. Neck is nontender and supple without adenopathy or JVD. Back is nontender and there is no CVA tenderness. Lungs are clear without rales, wheezes, or rhonchi. Chest  is nontender. Heart has regular rate and rhythm without murmur. Abdomen is soft, flat, nontender without masses or hepatosplenomegaly and peristalsis is normoactive. Extremities have no cyanosis or edema, full range of motion is present. Skin is warm and dry without rash. Neurologic: Mental status is normal, cranial nerves are intact, there are no motor or sensory deficits.   ED Course  Procedures (including critical care time) Labs Review Results for orders placed during the hospital encounter of 10/23/13  CBC      Result Value Ref Range   WBC 8.8  4.0 - 10.5 K/uL   RBC 4.29  4.22 - 5.81 MIL/uL   Hemoglobin 14.7  13.0 - 17.0 g/dL   HCT 16.140.7  09.639.0 - 04.552.0 %   MCV 94.9  78.0 - 100.0 fL   MCH 34.3 (*) 26.0 - 34.0 pg   MCHC 36.1 (*) 30.0 - 36.0 g/dL   RDW 40.912.2  81.111.5 - 91.415.5 %   Platelets 172  150 - 400 K/uL  BASIC  METABOLIC PANEL      Result Value Ref Range   Sodium 138  137 - 147 mEq/L   Potassium 3.8  3.7 - 5.3 mEq/L   Chloride 99  96 - 112 mEq/L   CO2 25  19 - 32 mEq/L   Glucose, Bld 109 (*) 70 - 99 mg/dL   BUN 13  6 - 23 mg/dL   Creatinine, Ser 7.821.18  0.50 - 1.35 mg/dL   Calcium 95.610.4  8.4 - 21.310.5 mg/dL   GFR calc non Af Amer 77 (*) >90 mL/min   GFR calc Af Amer 90 (*) >90 mL/min  PRO B NATRIURETIC PEPTIDE      Result Value Ref Range   Pro B Natriuretic peptide (BNP) <5.0  0 - 125 pg/mL  POCT I-STAT TROPONIN I      Result Value Ref Range   Troponin i, poc 0.01  0.00 - 0.08 ng/mL   Comment 3           CG4 I-STAT (LACTIC ACID)      Result Value Ref Range   Lactic Acid, Venous 1.91  0.5 - 2.2 mmol/L   Imaging Review Dg Chest 2 View  10/23/2013   CLINICAL DATA:  Mid chest pain  EXAM: CHEST  2 VIEW  COMPARISON:  July 15, 2013  FINDINGS: The heart size and mediastinal contours are within normal limits. Both lungs are clear. The visualized skeletal structures are unremarkable.  IMPRESSION: No active cardiopulmonary disease.   Electronically Signed   By: Sherian ReinWei-Chen  Lin M.D.   On: 10/23/2013 01:15    EKG Interpretation    Date/Time:  Thursday October 23 2013 00:35:08 EST Ventricular Rate:  104 PR Interval:  208 QRS Duration: 86 QT Interval:  316 QTC Calculation: 415 R Axis:   122 Text Interpretation:  Sinus tachycardia Right axis deviation Anterior infarct , age undetermined Abnormal ECG When compared with ECG of 07/15/2013, No significant change was found Confirmed by [facility]  MD, Elsa Ploch ([id number]) on 10/23/2013 1:12:48 AM            MDM   Final diagnoses:  Chest pain  Cocaine abuse    Chest pain which is unlikely to be cardiac. Old records are reviewed and he does have a prior ED visit for cocaine related chest pain. Today's pain is unlikely to be related to cocaine since it has been within 24 hours since his last cocaine use. He'll be given a trial of a GI  cocktail. ECG is unchanged  from baseline and troponin is normal.  He got no relief from a GI cocktail. She'll be given an trial of ketorolac.  He got good relief of pain with ketorolac. He is discharged with instructions to take over-the-counter NSAIDs such as naproxen or ibuprofen. He is urged to stop cocaine use.  Dione Booze, MD 10/23/13 [phone]

## 2013-10-23 NOTE — ED Notes (Signed)
The patient has no complains; while performing orthostatic vitals. The tech has reported to the RN in charge.

## 2013-10-23 NOTE — ED Notes (Signed)
Pt. reports mid chest pain onset this evening with slight SOB and nausea.

## 2013-11-22 ENCOUNTER — Emergency Department (HOSPITAL_COMMUNITY)
Admission: EM | Admit: 2013-11-22 | Discharge: 2013-11-22 | Disposition: A | Discharge status: Home / Self Care | Payer: Self-pay | Attending: Emergency Medicine | Admitting: Emergency Medicine

## 2013-11-22 ENCOUNTER — Encounter (HOSPITAL_COMMUNITY): Payer: Self-pay | Admitting: Emergency Medicine

## 2013-11-22 ENCOUNTER — Emergency Department (HOSPITAL_COMMUNITY): Payer: Self-pay

## 2013-11-22 ENCOUNTER — Emergency Department (HOSPITAL_COMMUNITY)
Admission: EM | Admit: 2013-11-22 | Discharge: 2013-11-22 | Discharge status: Against Medical Advice | Payer: Self-pay | Attending: Emergency Medicine | Admitting: Emergency Medicine

## 2013-11-22 DIAGNOSIS — F329 Major depressive disorder, single episode, unspecified: Secondary | ICD-10-CM | POA: Insufficient documentation

## 2013-11-22 DIAGNOSIS — R519 Headache, unspecified: Secondary | ICD-10-CM

## 2013-11-22 DIAGNOSIS — Z79899 Other long term (current) drug therapy: Secondary | ICD-10-CM | POA: Insufficient documentation

## 2013-11-22 DIAGNOSIS — Z8709 Personal history of other diseases of the respiratory system: Secondary | ICD-10-CM | POA: Insufficient documentation

## 2013-11-22 DIAGNOSIS — R51 Headache: Secondary | ICD-10-CM | POA: Insufficient documentation

## 2013-11-22 DIAGNOSIS — F3289 Other specified depressive episodes: Secondary | ICD-10-CM | POA: Insufficient documentation

## 2013-11-22 DIAGNOSIS — Z8669 Personal history of other diseases of the nervous system and sense organs: Secondary | ICD-10-CM | POA: Insufficient documentation

## 2013-11-22 DIAGNOSIS — F17200 Nicotine dependence, unspecified, uncomplicated: Secondary | ICD-10-CM | POA: Insufficient documentation

## 2013-11-22 LAB — COMPREHENSIVE METABOLIC PANEL
ALBUMIN: 4.1 g/dL (ref 3.5–5.2)
ALT: 18 U/L (ref 0–53)
AST: 19 U/L (ref 0–37)
Alkaline Phosphatase: 61 U/L (ref 39–117)
BILIRUBIN TOTAL: 0.4 mg/dL (ref 0.3–1.2)
BUN: 13 mg/dL (ref 6–23)
CALCIUM: 10.2 mg/dL (ref 8.4–10.5)
CO2: 25 mEq/L (ref 19–32)
CREATININE: 1.13 mg/dL (ref 0.50–1.35)
Chloride: 103 mEq/L (ref 96–112)
GFR calc Af Amer: >90 mL/min (ref >90)
GFR calc non Af Amer: 82 mL/min — ABNORMAL LOW (ref >90)
Glucose, Bld: 105 mg/dL — ABNORMAL HIGH (ref 70–99)
Potassium: 3.9 mEq/L (ref 3.7–5.3)
Sodium: 141 mEq/L (ref 137–147)
TOTAL PROTEIN: 7.1 g/dL (ref 6.0–8.3)

## 2013-11-22 LAB — PROTIME-INR
INR: 0.93 (ref 0.00–1.49)
Prothrombin Time: 12.3 seconds (ref 11.6–15.2)

## 2013-11-22 LAB — DIFFERENTIAL
BASOS ABS: 0 10*3/uL (ref 0.0–0.1)
BASOS PCT: 0 % (ref 0–1)
EOS ABS: 0.3 10*3/uL (ref 0.0–0.7)
Eosinophils Relative: 5 % (ref 0–5)
Lymphocytes Relative: 48 % — ABNORMAL HIGH (ref 12–46)
Lymphs Abs: 3.3 10*3/uL (ref 0.7–4.0)
MONO ABS: 0.7 10*3/uL (ref 0.1–1.0)
Monocytes Relative: 10 % (ref 3–12)
NEUTROS ABS: 2.5 10*3/uL (ref 1.7–7.7)
Neutrophils Relative %: 37 % — ABNORMAL LOW (ref 43–77)

## 2013-11-22 LAB — CBC
HCT: 40 % (ref 39.0–52.0)
Hemoglobin: 14 g/dL (ref 13.0–17.0)
MCH: 33.7 pg (ref 26.0–34.0)
MCHC: 35 g/dL (ref 30.0–36.0)
MCV: 96.4 fL (ref 78.0–100.0)
Platelets: 167 10*3/uL (ref 150–400)
RBC: 4.15 MIL/uL — ABNORMAL LOW (ref 4.22–5.81)
RDW: 12.2 % (ref 11.5–15.5)
WBC: 6.8 10*3/uL (ref 4.0–10.5)

## 2013-11-22 LAB — I-STAT TROPONIN, ED: Troponin i, poc: 0 ng/mL (ref 0.00–0.08)

## 2013-11-22 LAB — APTT: APTT: 25 s (ref 24–37)

## 2013-11-22 MED ORDER — TRAMADOL HCL 50 MG PO TABS: 50.0000 mg | ORAL_TABLET | Freq: Four times a day (QID) | ORAL | Status: DC | PRN

## 2013-11-22 MED ORDER — PROMETHAZINE HCL 25 MG/ML IJ SOLN
25.0000 mg | Freq: Once | INTRAMUSCULAR | Status: AC
  Administered 2013-11-22: 25 mg via INTRAMUSCULAR
  Filled 2013-11-22: qty 1

## 2013-11-22 MED ORDER — KETOROLAC TROMETHAMINE 60 MG/2ML IM SOLN
60.0000 mg | Freq: Once | INTRAMUSCULAR | Status: AC
  Administered 2013-11-22: 60 mg via INTRAMUSCULAR
  Filled 2013-11-22: qty 2

## 2013-11-22 NOTE — ED Notes (Signed)
Pt presents to department for evaluation of headache. Was seen for same earlier this morning. 7/10 pain upon arrival. Denies blurred vision, denies nausea/vomiting. Pt is conscious alert and oriented x4. No signs of acute distress noted.

## 2013-11-22 NOTE — ED Notes (Addendum)
Pt presents with a Left lateral headache upon waking this am, pt seen here eralier today for the same. NAD, PERRL, no facial droop, arm drift or neuro deficits

## 2013-11-22 NOTE — ED Provider Notes (Signed)
CSN: [id number]     Arrival date & time 11/22/13  1253 History   First MD Initiated Contact with Patient 11/22/13 1305     Chief Complaint  Patient presents with  . Headache     (Consider location/radiation/quality/duration/timing/severity/associated sxs/prior Treatment) HPI Comments: Patient is a 38 year old male with no significant past medical history. He presents today with complaints of headache that has been occurring off and on for the past week. He denies any fever or stiff neck. He denies any injury or trauma. He reports the headache as a throbbing sensation. There are no visual disturbances, dizziness. He has tried Aleve and aspirin at home without relief. He does admit to daily cocaine use. He signed in here last night to be evaluated. He had a head CT performed but he does not know the results. He left prior to being evaluated.  Patient is a 38 y.o. male presenting with headaches. The history is provided by the patient.  Headache Pain location:  Frontal Quality:  Stabbing Onset quality:  Sudden Duration:  1 week Timing:  Intermittent Progression:  Worsening Chronicity:  New Similar to prior headaches: no     Past Medical History  Diagnosis Date  . Cocaine abuse   . Depression   . Seizures   . Polysubstance abuse   . Sinus infection    Past Surgical History  Procedure Laterality Date  . Femur surgery     History reviewed. No pertinent family history. History  Substance Use Topics  . Smoking status: Current Every Day Smoker -- 0.50 packs/day    Types: Cigarettes  . Smokeless tobacco: Not on file  . Alcohol Use: Yes    Review of Systems  Neurological: Positive for headaches.  All other systems reviewed and are negative.      Allergies  Review of patient's allergies indicates no known allergies.  Home Medications   Current Outpatient Rx  Name  Route  Sig  Dispense  Refill  . ALPRAZolam (XANAX) 1 MG tablet   Oral   Take 1 mg by mouth daily.          Marland Kitchen. aspirin 325 MG tablet   Oral   Take 650 mg by mouth every 6 (six) hours as needed for mild pain or headache.          . calcium carbonate (TUMS) 500 MG chewable tablet   Oral   Chew 4 tablets by mouth 2 (two) times daily as needed for indigestion or heartburn.         . naproxen sodium (ANAPROX) 220 MG tablet   Oral   Take 440 mg by mouth 2 (two) times daily as needed (for headache, pain).          BP 123/91  Pulse 82  Temp(Src) 97.8 F (36.6 C) (Oral)  Resp 17  Ht 5\' 6"  (1.676 m)  Wt 160 lb (72.576 kg)  BMI 25.84 kg/m2  SpO2 100% Physical Exam  Nursing note and vitals reviewed. Constitutional: He is oriented to person, place, and time. He appears well-developed and well-nourished. No distress.  HENT:  Head: Normocephalic and atraumatic.  Mouth/Throat: Oropharynx is clear and moist.  Eyes: EOM are normal. Pupils are equal, round, and reactive to light.  There is no papilledema on funduscopic exam.  Neck: Normal range of motion. Neck supple.  Cardiovascular: Normal rate, regular rhythm and normal heart sounds.   No murmur heard. Pulmonary/Chest: Effort normal and breath sounds normal. No respiratory distress. He has no wheezes.  Abdominal: Soft. Bowel sounds are normal. He exhibits no distension. There is no tenderness.  Musculoskeletal: Normal range of motion. He exhibits no edema.  Lymphadenopathy:    He has no cervical adenopathy.  Neurological: He is alert and oriented to person, place, and time. No cranial nerve deficit. He exhibits normal muscle tone. Coordination normal.  Skin: Skin is warm and dry. He is not diaphoretic.    ED Course  Procedures (including critical care time) Labs Review Labs Reviewed - No data to display Imaging Review Ct Head (brain) Wo Contrast  11/22/2013   CLINICAL DATA:  Stroke symptoms for 2 weeks, left facial numbness and arm numbness. Left frontal headache.  EXAM: CT HEAD WITHOUT CONTRAST  TECHNIQUE: Contiguous axial images  were obtained from the base of the skull through the vertex without intravenous contrast.  COMPARISON:  CT NECK W/CM dated 08/13/2011  FINDINGS: The ventricles and sulci are normal. No intraparenchymal hemorrhage, mass effect nor midline shift. No acute large vascular territory infarcts.  No abnormal extra-axial fluid collections. Basal cisterns are patent.  No skull fracture. Visualized paranasal sinuses and mastoid air-cells are well-aerated. The included ocular globes and orbital contents are non-suspicious.  IMPRESSION: No acute intracranial process.  Normal noncontrast CT of the head.   Electronically Signed   By: Awilda Metro   On: 11/22/2013 04:34     EKG Interpretation None      MDM   Final diagnoses:  None    Patient presents with complaints of headache. His neurologic exam is unremarkable and CT scan performed early this a.m. was negative. He is feeling better with Toradol and Phenergan. He reports daily cocaine use and he stopped this several days ago. I suspect this may be the reason for his headache. I will prescribe tramadol which she can take as needed if his headache recurs. He understands not to mix this with alcohol or other illicit drugs.    Geoffery Lyons, MD 11/22/13 [phone]

## 2013-11-22 NOTE — ED Notes (Addendum)
C/o L sided headache since Friday morning that went away for a little while during the night and started again approx 30 min ago.  Reports mild nausea.  Denies vomiting.  Reports history of headaches.  Took 2 adult ASA 30 min ago. Reports tingling to L arm and numbness to L 4th and 5th digits since Friday morning.  Also reports L side of face feeling sore and L sided facial numbness.  No neuro deficits noted at triage.  Pt states he believes symptoms are coming from cocaine withdrawal.  Last used cocaine 2 days ago.  Normally uses cocaine daily x 3-4 years.

## 2013-11-22 NOTE — ED Notes (Signed)
Called X1 to bring back to a room. No response.

## 2013-11-22 NOTE — Discharge Instructions (Signed)
Tramadol as prescribed as needed for headache.  Return to the ER if you develop high fever, stiff neck, or other new and concerning symptoms.   Headaches, Frequently Asked Questions MIGRAINE HEADACHES Q: What is migraine? What causes it? How can I treat it? A: Generally, migraine headaches begin as a dull ache. Then they develop into a constant, throbbing, and pulsating pain. You may experience pain at the temples. You may experience pain at the front or back of one or both sides of the head. The pain is usually accompanied by a combination of:  Nausea.  Vomiting.  Sensitivity to light and noise. Some people (about 15%) experience an aura (see below) before an attack. The cause of migraine is believed to be chemical reactions in the brain. Treatment for migraine may include over-the-counter or prescription medications. It may also include self-help techniques. These include relaxation training and biofeedback.  Q: What is an aura? A: About 15% of people with migraine get an "aura". This is a sign of neurological symptoms that occur before a migraine headache. You may see wavy or jagged lines, dots, or flashing lights. You might experience tunnel vision or blind spots in one or both eyes. The aura can include visual or auditory hallucinations (something imagined). It may include disruptions in smell (such as strange odors), taste or touch. Other symptoms include:  Numbness.  A "pins and needles" sensation.  Difficulty in recalling or speaking the correct word. These neurological events may last as long as 60 minutes. These symptoms will fade as the headache begins. Q: What is a trigger? A: Certain physical or environmental factors can lead to or "trigger" a migraine. These include:  Foods.  Hormonal changes.  Weather.  Stress. It is important to remember that triggers are different for everyone. To help prevent migraine attacks, you need to figure out which triggers affect you. Keep  a headache diary. This is a good way to track triggers. The diary will help you talk to your healthcare professional about your condition. Q: Does weather affect migraines? A: Bright sunshine, hot, humid conditions, and drastic changes in barometric pressure may lead to, or "trigger," a migraine attack in some people. But studies have shown that weather does not act as a trigger for everyone with migraines. Q: What is the link between migraine and hormones? A: Hormones start and regulate many of your body's functions. Hormones keep your body in balance within a constantly changing environment. The levels of hormones in your body are unbalanced at times. Examples are during menstruation, pregnancy, or menopause. That can lead to a migraine attack. In fact, about three quarters of all women with migraine report that their attacks are related to the menstrual cycle.  Q: Is there an increased risk of stroke for migraine sufferers? A: The likelihood of a migraine attack causing a stroke is very remote. That is not to say that migraine sufferers cannot have a stroke associated with their migraines. In persons under age 10, the most common associated factor for stroke is migraine headache. But over the course of a person's normal life span, the occurrence of migraine headache may actually be associated with a reduced risk of dying from cerebrovascular disease due to stroke.  Q: What are acute medications for migraine? A: Acute medications are used to treat the pain of the headache after it has started. Examples over-the-counter medications, NSAIDs, ergots, and triptans.  Q: What are the triptans? A: Triptans are the newest class of abortive medications. They  are specifically targeted to treat migraine. Triptans are vasoconstrictors. They moderate some chemical reactions in the brain. The triptans work on receptors in your brain. Triptans help to restore the balance of a neurotransmitter called serotonin.  Fluctuations in levels of serotonin are thought to be a main cause of migraine.  Q: Are over-the-counter medications for migraine effective? A: Over-the-counter, or "OTC," medications may be effective in relieving mild to moderate pain and associated symptoms of migraine. But you should see your caregiver before beginning any treatment regimen for migraine.  Q: What are preventive medications for migraine? A: Preventive medications for migraine are sometimes referred to as "prophylactic" treatments. They are used to reduce the frequency, severity, and length of migraine attacks. Examples of preventive medications include antiepileptic medications, antidepressants, beta-blockers, calcium channel blockers, and NSAIDs (nonsteroidal anti-inflammatory drugs). Q: Why are anticonvulsants used to treat migraine? A: During the past few years, there has been an increased interest in antiepileptic drugs for the prevention of migraine. They are sometimes referred to as "anticonvulsants". Both epilepsy and migraine may be caused by similar reactions in the brain.  Q: Why are antidepressants used to treat migraine? A: Antidepressants are typically used to treat people with depression. They may reduce migraine frequency by regulating chemical levels, such as serotonin, in the brain.  Q: What alternative therapies are used to treat migraine? A: The term "alternative therapies" is often used to describe treatments considered outside the scope of conventional Western medicine. Examples of alternative therapy include acupuncture, acupressure, and yoga. Another common alternative treatment is herbal therapy. Some herbs are believed to relieve headache pain. Always discuss alternative therapies with your caregiver before proceeding. Some herbal products contain arsenic and other toxins. TENSION HEADACHES Q: What is a tension-type headache? What causes it? How can I treat it? A: Tension-type headaches occur randomly. They  are often the result of temporary stress, anxiety, fatigue, or anger. Symptoms include soreness in your temples, a tightening band-like sensation around your head (a "vice-like" ache). Symptoms can also include a pulling feeling, pressure sensations, and contracting head and neck muscles. The headache begins in your forehead, temples, or the back of your head and neck. Treatment for tension-type headache may include over-the-counter or prescription medications. Treatment may also include self-help techniques such as relaxation training and biofeedback. CLUSTER HEADACHES Q: What is a cluster headache? What causes it? How can I treat it? A: Cluster headache gets its name because the attacks come in groups. The pain arrives with little, if any, warning. It is usually on one side of the head. A tearing or bloodshot eye and a runny nose on the same side of the headache may also accompany the pain. Cluster headaches are believed to be caused by chemical reactions in the brain. They have been described as the most severe and intense of any headache type. Treatment for cluster headache includes prescription medication and oxygen. SINUS HEADACHES Q: What is a sinus headache? What causes it? How can I treat it? A: When a cavity in the bones of the face and skull (a sinus) becomes inflamed, the inflammation will cause localized pain. This condition is usually the result of an allergic reaction, a tumor, or an infection. If your headache is caused by a sinus blockage, such as an infection, you will probably have a fever. An x-ray will confirm a sinus blockage. Your caregiver's treatment might include antibiotics for the infection, as well as antihistamines or decongestants.  REBOUND HEADACHES Q: What is a rebound headache?  What causes it? How can I treat it? A: A pattern of taking acute headache medications too often can lead to a condition known as "rebound headache." A pattern of taking too much headache medication  includes taking it more than 2 days per week or in excessive amounts. That means more than the label or a caregiver advises. With rebound headaches, your medications not only stop relieving pain, they actually begin to cause headaches. Doctors treat rebound headache by tapering the medication that is being overused. Sometimes your caregiver will gradually substitute a different type of treatment or medication. Stopping may be a challenge. Regularly overusing a medication increases the potential for serious side effects. Consult a caregiver if you regularly use headache medications more than 2 days per week or more than the label advises. ADDITIONAL QUESTIONS AND ANSWERS Q: What is biofeedback? A: Biofeedback is a self-help treatment. Biofeedback uses special equipment to monitor your body's involuntary physical responses. Biofeedback monitors:  Breathing.  Pulse.  Heart rate.  Temperature.  Muscle tension.  Brain activity. Biofeedback helps you refine and perfect your relaxation exercises. You learn to control the physical responses that are related to stress. Once the technique has been mastered, you do not need the equipment any more. Q: Are headaches hereditary? A: Four out of five (80%) of people that suffer report a family history of migraine. Scientists are not sure if this is genetic or a family predisposition. Despite the uncertainty, a child has a 50% chance of having migraine if one parent suffers. The child has a 75% chance if both parents suffer.  Q: Can children get headaches? A: By the time they reach high school, most young people have experienced some type of headache. Many safe and effective approaches or medications can prevent a headache from occurring or stop it after it has begun.  Q: What type of doctor should I see to diagnose and treat my headache? A: Start with your primary caregiver. Discuss his or her experience and approach to headaches. Discuss methods of  classification, diagnosis, and treatment. Your caregiver may decide to recommend you to a headache specialist, depending upon your symptoms or other physical conditions. Having diabetes, allergies, etc., may require a more comprehensive and inclusive approach to your headache. The National Headache Foundation will provide, upon request, a list of [facility] physician members in your state. Document Released: 11/18/2003 Document Revised: 11/20/2011 Document Reviewed: 04/27/2008 [facility] Patient Information 2014 [place], [place].    Emergency Department Resource Guide 1) Find a Doctor and Pay Out of Pocket Although you won't have to find out who is covered by your insurance plan, it is a good idea to ask around and get recommendations. You will then need to call the office and see if the doctor you have chosen will accept you as a new patient and what types of options they offer for patients who are self-pay. Some doctors offer discounts or will set up payment plans for their patients who do not have insurance, but you will need to ask so you aren't surprised when you get to your appointment.  2) Contact Your Local Health Department Not all health departments have doctors that can see patients for sick visits, but many do, so it is worth a call to see if yours does. If you don't know where your local health department is, you can check in your phone book. The CDC also has a tool to help you locate your state's health department, and many state websites also have  listings of all of their local health departments.  3) Find a Walk-in Clinic If your illness is not likely to be very severe or complicated, you may want to try a walk in clinic. These are popping up all over the country in pharmacies, drugstores, and shopping centers. They're usually staffed by nurse practitioners or physician assistants that have been trained to treat common illnesses and complaints. They're usually fairly quick and inexpensive.  However, if you have serious medical issues or chronic medical problems, these are probably not your best option.  No Primary Care Doctor: - Call Health Connect at  [phone] - they can help you locate a primary care doctor that  accepts your insurance, provides certain services, etc. - Physician Referral Service- [phone]  Chronic Pain Problems: Organization         Address  Phone   Notes  [facility]  [phone] Patients need to be referred by their primary care doctor.   Medication Assistance: Organization         Address  Phone   Notes  [facility] Medication [facility] [address] [place], [place] [address] [phone] --Must be a resident of [facility] -- Must have NO insurance coverage whatsoever (no Medicaid/ Medicare, etc.) -- The pt. MUST have a primary care doctor that directs their care regularly and follows them in the community   [facility]  [phone]   [employer]  [phone]    Agencies that provide inexpensive medical care: Organization         Address  Phone   Notes  [facility]  [phone]   [facility]    [phone]   [facility] [address] [place], [place] [address] [phone]   [facility] [address], [place] [phone]   Planned Parenthood    [phone]   [facility]    [phone]   [facility]  [address], [place] Phone:  [phone], Fax:  [phone] Hours of Operation:  9 am - 6 pm, M-F.  Also accepts Medicaid/Medicare and self-pay.  [facility]  [address], [place] Phone: [phone], Fax: [phone]. Hours of Operation:  8:30 am - 5:30 pm, M-F.  Also accepts Medicaid and self-pay.  [facility] [place] [address] [place] Phone: [phone]   [facility] [address] Natasha Bence [place], [place] [phone], Ext. [phone] Mondays & Thursdays: 7-9 AM.  First 15 patients are seen on a first come, first serve basis.    Medicaid-accepting [facility] Providers:  Organization         Address  Phone   Notes  [facility] [address], [place] [phone] Also accepts self-pay patients.  [facility] [address] Laurell Josephs [place], [place]  [phone]   [facility] [address], [place] [phone]   [facility] [address] [place] [phone]   Renaye Rakers [address], [place]   [phone] Only accepts [place] patients after they have their name applied to their card.   Self-Pay (no insurance) in [facility]:  [employer]  Notes  Sickle Cell Patients, [facility] [address], [place] [phone]   [facility] [address], [place] [phone]) [phone]   [facility] [place]  [address], [place] [phone]   [facility]/Dr. Osei-Bonsu  [address] [place] [address], [place] or [phone], [place] [phone] Phone number for both [place] and [place] locations is the same.  [facility] [address], [place] [phone]) [phone]   [facility] [place] [address] [place] [address], [place] or [address] [phone]   [facility] [address], [place] [phone]) [phone], phone; [phone], fax Sees patients 1st and 3rd Saturday of every month.  Must not qualify for public or private insurance (i.e. Medicaid, Medicare, [place] Health Choice, Veterans' Benefits)  Household income should be no more than 200% of the poverty level The clinic cannot treat you if you are pregnant or think  you are pregnant  Sexually transmitted diseases are not treated at the clinic.    Dental Care: Organization         Address  Phone  Notes  [facility] [address], [place] [phone] Accepts children up to age 38 who are enrolled in [place] or [place] Health Choice; pregnant women with a Medicaid card; and children who have applied for Medicaid or [place] Health Choice, but were declined, whose parents can pay a reduced fee at time of service.  [facility] [place]  [address], [place] [phone] Accepts children up to age 38 who are enrolled in [place] or [place] Health Choice; pregnant women with a Medicaid card; and children who have applied for Medicaid or [place] Health Choice, but were declined, whose parents can pay a reduced fee at time of service.  [facility]  [address], [place] [phone] Patients are seen by appointment only. Walk-ins are not accepted. [facility] will see patients 38 years of age and older. Monday - Tuesday (8am-5pm) Most Wednesdays (8:30-5pm) $30 per visit, cash only  [facility]  [address], [place] [phone] Patients are seen by appointment only. Walk-ins are not accepted. [facility] will see patients 38 years of age and older. One Wednesday Evening (Monthly: Volunteer Based).  $30 per visit, cash only  [employer] [facility]  [phone] for adults; Children under age 664, call Graduate Pediatric Dentistry at [phone]) [phone]. Children aged 364-14, please call [phone] to request a pediatric application.  Dental services are provided in all areas of dental care including fillings, crowns and bridges, complete and partial dentures, implants, gum treatment, root canals, and extractions. Preventive care is also provided. Treatment is provided to both adults and  children. Patients are selected via a lottery and there is often a waiting list.   [facility] [address], [place]  [phone]) [phone] [email or web address]   [facility] [address], [place], [place] [phone], Ext. [phone] Second and Fourth Thursday of each month, opens at 6:30 AM; Clinic ends at 9 AM.  Patients are seen on a first-come first-served basis, and a limited number are seen during each clinic.   [facility]  [address], [place], [place] [phone]) [phone]  Eligibility Requirements °You must have lived in [place], [place], or [place] counties for at least the last three months. °  You cannot be eligible for state or federal sponsored healthcare insurance, including Veterans Administration, Medicaid, or Medicare. °  You generally cannot be eligible for healthcare insurance through your employer.  °  How to apply: °Eligibility screenings are held every Tuesday and Wednesday afternoon from 1:00 pm until 4:00 pm. You do not need an appointment for the interview!  °[facility] [address], [place], [place] [phone]   °[facility]  336-342-8273   °[facility]  336-703-3100   °[facility]  336-570-6415   ° °Behavioral Health Resources in the Community: °Intensive Outpatient Programs °Organization         Address  Phone  Notes  °[place] [facility] [address], [place], [place] [phone]   °[facility] [address], [place], [place] 336-832-9800   °[facility] [address], [place], [place] ° 336-882-2125   °[facility] [address],  °[place], [place] 1-800-853-5163 or 336-641-4981   °Substance Abuse Resources °Organization         Address  Phone  Notes  °[facility]  336-882-2125   °[facility]  336-784-9470   °[facility]  336-285-9073     °[facility]  336-845-3988   °Residential & Outpatient Substance Abuse Program  1-800-659-3381   °Psychological Services °Organization         Address  Phone  Notes  °[facility]  336- 832-9600   °[facility]  336- 378-7881   °[facility] [address], [place] 1-800-853-5163 or 336-641-4981   ° °Mobile Crisis Teams °Organization         Address  Phone  Notes  °[facility], Mobile Crisis Care Unit  1-877-626-1772   °Assertive °[facility] ° [address] [place], [place] 336-834-9664   °Sharon DeEsch [address] °[place] 336-554-5454   ° °Self-Help/Support Groups °Organization         Address  Phone             Notes  °[facility] [place] - variety of support groups  336- 373-1402 Call for more information  °Narcotics Anonymous (NA), [facility] [address], °[place]  2 meetings at this location  ° °Residential Treatment Programs °Organization         Address  Phone  Notes  °[facility] [address],    °[place]  1-866-801-8205   °[facility] ° [address], [place], [place] 704-293-8524   °[facility] [address], [place] 336-845-3988 Admissions: 8am-3pm M-F  °[facility] [address]    °[place], [place] 336-841-1104   °[facility] [address], [place], [place] 336-379-7146   °[facility] [address]  °[place], [place] 336-285-9073   °[facility] - Intensive Outpatient [address], [place], [place] 336-852-3033   °[facility] [address]  °[place], [place] 1-877-615-2722 or 336-784-9470   °[facility] ([facility]) [address] [place], [place] 336-227-7417 Accepts Medicaid  °[facility] [address]  ° [place] 1-800-659-3381 Substance Abuse/Addiction Treatment  ° °[place]  Behavioral Health Resources °Organization           Address  Phone  Notes  °[facility]  (888) 581-9988   °Julie Brannon, PhD [address] [place], [place]   (336) 349-5553 or (336) 951-0000   °[facility]   [address] °[place], [place] (336) 349-4454   °[facility] [address], [place], [place] (336) 342-8316 Insurance/Medicaid/sponsorship through [facility]  °[facility] [address]                                    [place], [place] (336) 342-8316 Therapy/tele-psych/case  °[facility] [address]  ° [place], [place] (336) 349-2233    °Dr. Arfeen  (336) 349-4544   °[facility] [place]  [employer] [place] [facility] 1) [address], [place] °2) [address], [place] °3)  [address], [place] (336) 349-3220 °(336) 342-7768 ° °(336) 342-8140   °[place] Child Abuse Hotline (336) 342-1394 or (336) 342-3537 (After Hours)    ° ° ° °

## 2013-12-30 ENCOUNTER — Emergency Department (HOSPITAL_COMMUNITY)
Admission: EM | Admit: 2013-12-30 | Discharge: 2013-12-31 | Disposition: A | Discharge status: Home / Self Care | Payer: Self-pay | Attending: Emergency Medicine | Admitting: Emergency Medicine

## 2013-12-30 ENCOUNTER — Encounter (HOSPITAL_COMMUNITY): Payer: Self-pay | Admitting: Emergency Medicine

## 2013-12-30 DIAGNOSIS — F17200 Nicotine dependence, unspecified, uncomplicated: Secondary | ICD-10-CM | POA: Insufficient documentation

## 2013-12-30 DIAGNOSIS — T46905A Adverse effect of unspecified agents primarily affecting the cardiovascular system, initial encounter: Secondary | ICD-10-CM | POA: Insufficient documentation

## 2013-12-30 DIAGNOSIS — F1910 Other psychoactive substance abuse, uncomplicated: Secondary | ICD-10-CM

## 2013-12-30 DIAGNOSIS — Z79899 Other long term (current) drug therapy: Secondary | ICD-10-CM | POA: Insufficient documentation

## 2013-12-30 DIAGNOSIS — G241 Genetic torsion dystonia: Secondary | ICD-10-CM | POA: Insufficient documentation

## 2013-12-30 DIAGNOSIS — T887XXA Unspecified adverse effect of drug or medicament, initial encounter: Secondary | ICD-10-CM

## 2013-12-30 DIAGNOSIS — R209 Unspecified disturbances of skin sensation: Secondary | ICD-10-CM | POA: Insufficient documentation

## 2013-12-30 DIAGNOSIS — F3289 Other specified depressive episodes: Secondary | ICD-10-CM | POA: Insufficient documentation

## 2013-12-30 DIAGNOSIS — F329 Major depressive disorder, single episode, unspecified: Secondary | ICD-10-CM | POA: Insufficient documentation

## 2013-12-30 MED ORDER — LORAZEPAM 1 MG PO TABS
1.0000 mg | ORAL_TABLET | Freq: Once | ORAL | Status: AC
  Administered 2013-12-30: 1 mg via ORAL
  Filled 2013-12-30: qty 1

## 2013-12-30 MED ORDER — IBUPROFEN 200 MG PO TABS
600.0000 mg | ORAL_TABLET | Freq: Once | ORAL | Status: AC
  Administered 2013-12-30: 600 mg via ORAL
  Filled 2013-12-30: qty 3

## 2013-12-30 MED ORDER — ACETAMINOPHEN 325 MG PO TABS
650.0000 mg | ORAL_TABLET | Freq: Once | ORAL | Status: DC
  Filled 2013-12-30: qty 2

## 2013-12-30 NOTE — ED Notes (Signed)
Discussed with Tiffany, PA-C,  the plan for medication.  Patient is not to receive Tylenol, but instead, give him Motrin 600mg  PO for headache.

## 2013-12-30 NOTE — ED Notes (Signed)
Patient reports he used cocaine last night.

## 2013-12-30 NOTE — ED Notes (Signed)
Patient reports he took sumatrittan succinate from an associate.

## 2013-12-30 NOTE — ED Notes (Signed)
Pt is very anxious in room and pulled nurse from hallway to discuss his concerns.  Pt states he took a medication that was not his for a headache and is now concerned because he thinks he is losing feeling in his left leg.  Pt vitals taken and MD made aware.

## 2013-12-30 NOTE — ED Provider Notes (Signed)
CSN: [id number]     Arrival date & time 12/30/13  2008 History   First MD Initiated Contact with Patient 12/30/13 2101     Chief Complaint  Patient presents with  . Medication Reaction     (Consider location/radiation/quality/duration/timing/severity/associated sxs/prior Treatment) HPI  Patient to the ER with complaints of adverse reaction to a medication.  Her reports using a large amount of cocaine, alcohol and taking Xanax through the night and into the morning. Around 6 am was his last hit of cocaine. He says he went to sleep for 2 hours and then woke up with a severe headache. He reports this headache is not unusual after taking the drugs he did. He decided to use Imitrex sq that was his friends around 6 pm tonight. It was at this time that he developed tingling to his extremities, a "weird feeling', and his mouth "drawing in". His headache has improved since the Imitrex but he still feels the other symptoms. He denies SOB, CP, weakness, confusion, change in vision.  Past Medical History  Diagnosis Date  . Cocaine abuse   . Depression   . Seizures   . Polysubstance abuse   . Sinus infection    Past Surgical History  Procedure Laterality Date  . Femur surgery     No family history on file. History  Substance Use Topics  . Smoking status: Current Every Day Smoker -- 0.50 packs/day    Types: Cigarettes  . Smokeless tobacco: Not on file  . Alcohol Use: Yes    Review of Systems   Review of Systems  Gen: no weight loss, fevers, chills, night sweats  Eyes: no discharge or drainage, no occular pain or visual changes  Nose: no epistaxis or rhinorrhea  Mouth: no dental pain, no sore throat  Neck: no neck pain  Lungs:No wheezing, coughing or hemoptysis CV: no chest pain, palpitations, dependent edema or orthopnea  Abd: no abdominal pain, nausea, vomiting, diarrhea GU: no dysuria or gross hematuria  MSK:  No muscle weakness or pain Neuro: + headache, + dystonia Skin: no  rash or wounds Psyche: no complaints    Allergies  Sumatriptan  Home Medications   Prior to Admission medications   Medication Sig Start Date End Date Taking? Authorizing Provider  ALPRAZolam Prudy Feeler(XANAX) 1 MG tablet Take 1 mg by mouth daily.   Yes Historical Provider, MD  aspirin 325 MG tablet Take 650 mg by mouth every 6 (six) hours as needed for mild pain or headache.    Yes Historical Provider, MD  calcium carbonate (TUMS) 500 MG chewable tablet Chew 4 tablets by mouth 2 (two) times daily as needed for indigestion or heartburn.   Yes Historical Provider, MD  Multiple Vitamin (MULTIVITAMIN) tablet Take 1 tablet by mouth daily.   Yes Historical Provider, MD  naproxen sodium (ANAPROX) 220 MG tablet Take 440 mg by mouth 2 (two) times daily as needed (for headache, pain).   Yes Historical Provider, MD   BP 135/91  Pulse 88  Temp(Src) 98 F (36.7 C) (Oral)  Resp 13  Ht 5\' 6"  (1.676 m)  Wt 160 lb (72.576 kg)  BMI 25.84 kg/m2  SpO2 96% Physical Exam  Nursing note and vitals reviewed. Constitutional: He is oriented to person, place, and time. He appears well-developed and well-nourished. No distress.  HENT:  Head: Normocephalic and atraumatic.  Eyes: Conjunctivae and EOM are normal. Pupils are equal, round, and reactive to light.  Neck: Normal range of motion. Neck supple.  Cardiovascular:  Normal rate and regular rhythm.   Pulmonary/Chest: Effort normal.  Abdominal: Soft.  Neurological: He is alert and oriented to person, place, and time. He has normal strength. No cranial nerve deficit or sensory deficit. He displays a negative Romberg sign. GCS eye subscore is 4. GCS verbal subscore is 5. GCS motor subscore is 6.  Skin: Skin is warm and dry.    ED Course  Procedures (including critical care time) Labs Review Labs Reviewed - No data to display  Imaging Review No results found.   EKG Interpretation None      MDM   Final diagnoses:  Non-dose-related adverse reaction to  medication  Substance abuse    Discussed the case with Dr. Oletta LamasGhim, since last illicit drug taken at 6am and symptoms starting after Imitrex which was taken at 6:00pm, symptoms seem to be due to the Imitrex. Will give 1mg  PO Ativan and monitor patient.  The patient was monitored in the ED for approx 3.5 hours. His symptoms resolved and he no longer has headache or symptoms of tingling. He was offered detox or assistance with substance abuse. He declines but says he would like outpatient resources. He is not having any chest pains, SOB, or headache. His vital signs are stable. He is ambulatory and now asymptomatic. I HIGHLY advised patient to discontinue use of alcohol, cocaine and non prescribed xanax.   37 y.o.Henry Stanley's evaluation in the Emergency Department is complete. It has been determined that no acute conditions requiring further emergency intervention are present at this time. The patient/guardian have been advised of the diagnosis and plan. We have discussed signs and symptoms that warrant return to the ED, such as changes or worsening in symptoms.  Vital signs are stable at discharge. Filed Vitals:   12/30/13 2300  BP: 135/91  Pulse: 88  Temp:   Resp: 13    Patient/guardian has voiced understanding and agreed to follow-up with the PCP or specialist.    Dorthula Matasiffany G Dailen Mcclish, PA-C 12/30/13 2358

## 2013-12-30 NOTE — ED Notes (Signed)
Pt took a shot of Imitrex PTA

## 2013-12-30 NOTE — ED Notes (Signed)
Patient placed on monitor, patient anxious, asking questions.  No needs voiced at this time, will continue to monitor.

## 2013-12-30 NOTE — ED Notes (Signed)
Pt. reports feeling " weird " tingling all over after receiving Imitrex injection for his headache . Respirations unlabored / airway intact .

## 2013-12-30 NOTE — ED Provider Notes (Signed)
Medical screening examination/treatment/procedure(s) were performed by non-physician practitioner and as supervising physician I was immediately available for consultation/collaboration.   EKG Interpretation None        Henry PoundMichael Y. Jodeci Rini, MD 12/30/13 2359

## 2013-12-30 NOTE — Discharge Instructions (Signed)
Cocaine Abuse and Chemical Dependency WHEN IS DRUG USE A PROBLEM? Anytime drug use is interfering with normal living activities it has become abuse. This includes problems with family and friends. Psychological dependence has developed when your mind tells you that the drug is needed. This is usually followed by physical dependence which has developed when continuing increases of drug are required to get the same feeling or "high". This is known as addiction or chemical dependency. A person's risk is much higher if there is a history of chemical dependency in the family. SIGNS OF CHEMICAL DEPENDENCY:  Been told by friends or family that drugs have become a problem.  Fighting when using drugs.  Having blackouts (not remembering what you do while using).  Feel sick from using drugs but continue using.  Lie about use or amounts of drugs (chemicals) used.  Need chemicals to get you going.  Suffer in work Systems analyst or school because of drug use.  Get sick from use of drugs but continue to use anyway.  Need drugs to relate to people or feel comfortable in social situations.  Use drugs to forget problems. Yes answered to any of the above signs of chemical dependency indicates there are problems. The longer the use of drugs continues, the greater the problems will become. If there is a family history of drug or alcohol use it is best not to experiment with these drugs. Experimentation leads to tolerance and needing to use more of the drug to get the same feeling. This is followed by addiction where drugs become the most important part of life. It becomes more important to take drugs than participate in the other usual activities of life including relating to friends and family. Addiction is followed by dependency where drugs are now needed not just to get high but to feel normal. Addiction cannot be cured but it can be stopped. This often requires outside help and the care of professionals.  Treatment centers are listed in the yellow pages under: Cocaine, Narcotics, and Alcoholics Anonymous. Most hospitals and clinics can refer you to a specialized care center. WHAT IS COCAINE? Cocaine is a strong nervous system stimulant which speeds up the body and gives the user the feeling that they have increased energy, loss of appetite and feelings of great pleasure. This "high" which begins within several minutes and lasts for less than an hour is followed by a "crash". The crash and depressed feelings that come with it cause a craving for the drug to regain the high. HOW IS COCANINE USED? Cocaine is snorted, injected, and smoked as free- base or crack. Because smoking the drug produces a greater high it is also associated with a greater low. It is therefore more rapidly addicting. WHAT ARE THE EFFECTS OF COCAINE? It is an anesthetic (pain killer) and a stimulant (it causes a high which gives a false feeling of well being). It increases heart and breathing rates with increases in body temperature and blood pressure. It removes appetite. It causes seizures (convulsions) along with nausea (feeling sick to your stomach), vomiting and stomach pain. This dangerous combination can lead to death. Trying to keep the high feeling leads to greater and greater drug use and this leads to addiction. Addiction can only be helped by stopping use of all chemicals. This is hard but may save your life. If the addiction is continued, the only possible outcome is loss of self respect and self esteem, violence, death, and eventually prison if the addict is fortunate  enough to be caught and able to receive help prior to this last life ending event. OTHER HEALTH RISKS OF COCAINE AND ALL DRUG USE ARE:  The increased possibility of getting AIDS or hepatitis (liver inflammation).  Having a baby born which is addicted to cocaine and must go through painful withdrawal including shaking, jerking, and crying in pain. Many of the  babies die. Other babies go through life with lifelong disabilities and learning problems. HOW TO STAY DRUG FREE ONCE YOU HAVE QUIT USING:  Develop healthy activities and form friends who do not use drugs.  Stay away from the drug scene.  Tell the pusher or former friend you have other better things to do.  Have ready excuses available about why you cannot use. For more help or information contact your local physician, clinic, hospital or dial 1-800-cocaine [phone]). Document Released: 08/25/2000 Document Revised: 11/20/2011 Document Reviewed: 04/15/2008 [facility] Patient Information 2014 [place].   RESOURCE GUIDE  Chronic Pain Problems: Contact [facility]  [phone] Patients need to be referred by their primary care doctor.  Insufficient Money for Medicine: Contact United Way:  call "211" or [place] [phone].  No Primary Care Doctor: Call Health Connect  [phone] - can help you locate a primary care doctor that  accepts your insurance, provides certain services, etc. Physician Referral Service- [phone]  Agencies that provide inexpensive medical care: [facility]  [facility]  [phone] [facility]  [phone] [facility]  [phone] Planned Parenthood  [phone] [facility]  [phone]  [place] Providers: [facility]- [address], [address]  [phone], Mon-Fri 9am-7pm, Sat 9am-1pm [place], [address] [place], [address] [place]  [place]- [address]  [place], [address], [phone]  Only accepts [place] patients after they have their name  applied to their card  Self Pay (no insurance) in [facility]: Sickle Cell Patients:  Dr Kevan Ny, [facility]  [place], [facility]- [place]  [facility] [place]- [address], [facility]- see information above (Speak to [employer] if you do not have insurance)       -  [facility]- [address], [place],  [place], [place]  Dr Vista Lawman-  [address], [place], [place] [facility]- [address] [phone]       -  [facility] - [address] [place], [place], also [address], [phone]       -    [facility]- [address], [place], 1st & 3rd Saturday   every month, 10am-1pm  1) Find a Doctor and Pay Out of Pocket Although you won't have to find out who is covered by your insurance plan, it is a good  idea to ask around and get recommendations. You will then need to call the office and see if the doctor you have chosen will accept you as a new patient and what types of options they offer for patients who are self-pay. Some doctors offer discounts or will set up payment plans for their patients who do not have insurance, but you will need to ask so you aren't surprised when you get to your appointment.  2) Contact Your Local Health Department Not all health departments have doctors that can see patients for sick visits, but many do, so it is worth a call to see if yours does. If you don't know where your local health department is, you can check in your phone book. The CDC also has a tool to help you locate your state's health department, and many state websites also have listings of all of their local health departments.  3) Find a [place] [facility] If your illness is not likely to be very severe or complicated, you may want to try a walk in clinic.  These are popping up all over the country in pharmacies, drugstores, and shopping centers. They're usually staffed by nurse practitioners or physician assistants that have been trained to treat common illnesses and complaints. They're usually fairly quick and inexpensive. However, if you have serious medical issues or chronic medical problems, these are probably not your best option  [facility] [place], [facility], [address] [place], phone [phone] or [phone].  Monday - Friday, call for an appointment. [place], [facility], [address], [place], phone [phone] or [phone].  Monday - Friday, call for an appointment.  Abuse/Neglect: [place] [phone] [place] [phone] (After Hours)  Emergency Shelter:  [facility] [phone]  Maternity Homes: [facility] [place] [phone] [place] [phone]  MRSA Hotline #:   (570)786-3560  [facility] [place] Dept. [address]         [place] Phone:  [phone]                                  Phone:  [phone]                   Phone:  [place], [place] in [place], [address],  [phone], [place] [phone] or [phone] (After Hours)   [place]  Substance Abuse Resources: Alcohol and Drug Services  [place] 6184223118 The [facility] Chinita P[facility] [phone] Residential & Outpatient Substance Abuse Program  [phone]  Psychological Services: [place]  [phone] [place]  [place], [place] [address] [place], [place]: [phone] or [phone], [email or web address]  Dental Assistance  If unable to pay or uninsured, contact:  [facility] or [facility]. to become qualified for the adult dental clinic.  Patients with Medicaid: [facility] [phone] [address] [place], [place] [address], [phone]  If unable to pay, or uninsured, contact [facility] [phone]) or [place] [phone] in [place], [place] in [facility]) to become qualified for the adult dental clinic  Other [place]- [place], [place], [place], [address], [place], [place], 2nd and 4th Thursday of the month at 6:30am.  10 clients each day by appointment, can sometimes see walk-in patients if someone does not show for an appointment. [facility]- [address] Danker [place], [place], [address], [place], [place], [place], [address], [place] [facility] [phone]  Please make every effort to establish with a primary care physician for routine medical care  [place]  The [place] provides a wide range of adult health services. Some of these services are designed to address the healthcare needs of all [place] residents and all services are designed to meet the needs of uninsured/underinsured low income residents. Some services are available to any resident of  [place], call [phone] for details. ] The [facility], a new medical clinic for adults, is now open. For more information about the Center and its services please call [phone]. For information on our [place] services, click here.  For more information on any of the following Department of Public Health programs, including hours of service, click on the highlighted link.  SERVICES FOR WOMEN (Adults and Teens) [employer] provide a full range of birth control options plus education and counseling. New patient visit and annual return visits include a complete examination, pap test as indicated, and other laboratory as indicated. Included is our [employer] for men.  Maternity Care is provided through pregnancy, including a six week post partum exam. Women who meet eligibility criteria for the Medicaid for Pregnant Women program, receive care free. Other women are charged on a sliding scale according to income. Note: [facility] provides services to pregnant women who have a Medicaid card. Call [phone] for an appointment in [place] or [phone] for an appointment in [facility].  Primary Care for Medicaid [place] Access Women is available through the [place]. As primary care provider for the [place] program, women may designate the [facility] clinic as their primary care provider.  PLEASE CALL [phone] FOR AN APPOINTMENT FOR THE ABOVE SERVICES IN EITHER [place] OR [place]. Information available in [place] and [place].   Childbirth Education Classes are open to the public and offered to help families prepare for the best possible childbirth experience as well as to promote lifelong health and wellness. Classes are offered  throughout the year and meet on the same night once a week for five weeks. Medicaid covers the cost of the  classes for the mother-to-be and her partner. For participants without Medicaid, the cost of the class series is $45.00 for the mother-to-be and her partner. Class size is limited and registration is required. For more information or to register call [phone]. Baby items donated by [employer] and the [employer] are given away during each class series.  SERVICES FOR WOMEN AND MEN Sexually Transmitted Infection appointments, including HIV testing, are available daily (weekdays, except holidays). Call early as same-day appointments are limited. For an appointment in either [facility] or [place], call [phone]. Services are confidential and free of charge.  Skin Testing for Tuberculosis Please call [phone]. Adult Immunizations are available, usually for a fee. Please call [phone] for details.  PLEASE CALL [phone] FOR AN APPOINTMENT FOR THE ABOVE SERVICES IN EITHER [place] OR [place].   [facility] provides up to the minute recommended vaccines for your travel destination. We also provide essential health and political information to help insure a safe and pleasurable travel experience. This program is self-sustaining, however, fees are very competitive. We are a CERTIFIED YELLOW FEVER IMMUNIZATION approved clinic site. PLEASE CALL [phone] FOR AN APPOINTMENT IN EITHER [place] OR [place].   If you have questions about the services listed above, we want to answer them! Email [place] at: [email or web address] Home Visiting Services for elderly and the disabled are available to residents of [facility] who are in need of care that compares to the care offered by a nursing home, have needs that can be met by the program, and have [place] Medicaid. Other short term services are available to residents 18 years and older who are unable to meet requirements for eligibility to receive services from a certified home health agency, spend the majority of time  at home, and need care for six months or less.  PLEASE CALL [id number] OR [phone] FOR MORE INFORMATION. Medication Assistance Program serves as a link between pharmaceutical companies and patients to provide low cost or free prescription medications. This servce is available for residents who meet certain income restrictions and have no insurance coverage.  PLEASE CALL [phone] ([employer]) OR [phone] ([place]) FOR MORE INFORMATION.  Updated Feb. 21, 2013

## 2013-12-30 NOTE — ED Notes (Signed)
Patient insists on removing IV from left forearm, cause his "hand is numb".  Started IV in right AC and removed IV in left arm. Discussed need for cardiac monitoring.

## 2014-01-01 ENCOUNTER — Ambulatory Visit: Payer: Self-pay

## 2014-01-08 ENCOUNTER — Emergency Department (HOSPITAL_COMMUNITY)
Admission: EM | Admit: 2014-01-08 | Discharge: 2014-01-08 | Disposition: A | Discharge status: Home / Self Care | Payer: Self-pay | Attending: Emergency Medicine | Admitting: Emergency Medicine

## 2014-01-08 ENCOUNTER — Encounter (HOSPITAL_COMMUNITY): Payer: Self-pay | Admitting: Emergency Medicine

## 2014-01-08 DIAGNOSIS — K922 Gastrointestinal hemorrhage, unspecified: Secondary | ICD-10-CM | POA: Insufficient documentation

## 2014-01-08 DIAGNOSIS — Z8669 Personal history of other diseases of the nervous system and sense organs: Secondary | ICD-10-CM | POA: Insufficient documentation

## 2014-01-08 DIAGNOSIS — F3289 Other specified depressive episodes: Secondary | ICD-10-CM | POA: Insufficient documentation

## 2014-01-08 DIAGNOSIS — Z8709 Personal history of other diseases of the respiratory system: Secondary | ICD-10-CM | POA: Insufficient documentation

## 2014-01-08 DIAGNOSIS — F17200 Nicotine dependence, unspecified, uncomplicated: Secondary | ICD-10-CM | POA: Insufficient documentation

## 2014-01-08 DIAGNOSIS — Z79899 Other long term (current) drug therapy: Secondary | ICD-10-CM | POA: Insufficient documentation

## 2014-01-08 DIAGNOSIS — F329 Major depressive disorder, single episode, unspecified: Secondary | ICD-10-CM | POA: Insufficient documentation

## 2014-01-08 DIAGNOSIS — K644 Residual hemorrhoidal skin tags: Secondary | ICD-10-CM | POA: Insufficient documentation

## 2014-01-08 LAB — COMPREHENSIVE METABOLIC PANEL
ALK PHOS: 64 U/L (ref 39–117)
ALT: 15 U/L (ref 0–53)
AST: 19 U/L (ref 0–37)
Albumin: 4.4 g/dL (ref 3.5–5.2)
BILIRUBIN TOTAL: 0.3 mg/dL (ref 0.3–1.2)
BUN: 16 mg/dL (ref 6–23)
CO2: 26 mEq/L (ref 19–32)
Calcium: 10.7 mg/dL — ABNORMAL HIGH (ref 8.4–10.5)
Chloride: 101 mEq/L (ref 96–112)
Creatinine, Ser: 1.18 mg/dL (ref 0.50–1.35)
GFR calc non Af Amer: 77 mL/min — ABNORMAL LOW (ref >90)
GFR, EST AFRICAN AMERICAN: 90 mL/min — AB (ref >90)
GLUCOSE: 119 mg/dL — AB (ref 70–99)
POTASSIUM: 4 meq/L (ref 3.7–5.3)
Sodium: 140 mEq/L (ref 137–147)
Total Protein: 7.8 g/dL (ref 6.0–8.3)

## 2014-01-08 LAB — TYPE AND SCREEN
ABO/RH(D): AB POS
Antibody Screen: NEGATIVE

## 2014-01-08 LAB — CBC
HEMATOCRIT: 42.2 % (ref 39.0–52.0)
HEMOGLOBIN: 14.9 g/dL (ref 13.0–17.0)
MCH: 34.5 pg — ABNORMAL HIGH (ref 26.0–34.0)
MCHC: 35.3 g/dL (ref 30.0–36.0)
MCV: 97.7 fL (ref 78.0–100.0)
Platelets: 168 10*3/uL (ref 150–400)
RBC: 4.32 MIL/uL (ref 4.22–5.81)
RDW: 12.3 % (ref 11.5–15.5)
WBC: 8.2 10*3/uL (ref 4.0–10.5)

## 2014-01-08 LAB — ABO/RH: ABO/RH(D): AB POS

## 2014-01-08 NOTE — ED Notes (Signed)
Patient states he got up to use the BR and had stool with dark blood in it.  Also states that he is lightheaded and dizzy

## 2014-01-08 NOTE — ED Provider Notes (Signed)
CSN: [id number]     Arrival date & time 01/08/14  0244 History   First MD Initiated Contact with Patient 01/08/14 0340     Chief Complaint  Patient presents with  . Rectal Bleeding      HPI Patient reports having a hard stool this evening at approximately 6 PM and several hours later he felt the need to have another bowel movement a small amount of blood and blood clot came out.  A small amount of stool came out this as well.  He's had no more rectal bleeding since then.  No history of GI bleeding.  He denies rectal intercourse.  He is not on anticoagulants.  He's never had a colonoscopy.  He states this happened one other time before in the past and resolved on its own and he never followed up with the gastroenterologist.  He states that he frequently has hard stools and struggles with constipation.   Past Medical History  Diagnosis Date  . Cocaine abuse   . Depression   . Seizures   . Polysubstance abuse   . Sinus infection    Past Surgical History  Procedure Laterality Date  . Femur surgery     History reviewed. No pertinent family history. History  Substance Use Topics  . Smoking status: Current Every Day Smoker -- 0.50 packs/day    Types: Cigarettes  . Smokeless tobacco: Not on file  . Alcohol Use: Yes    Review of Systems  All other systems reviewed and are negative.     Allergies  Sumatriptan  Home Medications   Prior to Admission medications   Medication Sig Start Date End Date Taking? Authorizing Provider  ALPRAZolam Prudy Feeler(XANAX) 1 MG tablet Take 1 mg by mouth daily.   Yes Historical Provider, MD  calcium carbonate (TUMS) 500 MG chewable tablet Chew 4 tablets by mouth 2 (two) times daily as needed for indigestion or heartburn.   Yes Historical Provider, MD  naproxen sodium (ANAPROX) 220 MG tablet Take 440 mg by mouth 2 (two) times daily as needed (for headache, pain).   Yes Historical Provider, MD   BP 117/73  Pulse 88  Temp(Src) 98 F (36.7 C) (Oral)  Resp  22  SpO2 95% Physical Exam  Nursing note and vitals reviewed. Constitutional: He is oriented to person, place, and time. He appears well-developed and well-nourished.  HENT:  Head: Normocephalic and atraumatic.  Eyes: EOM are normal.  Neck: Normal range of motion.  Cardiovascular: Normal rate, regular rhythm, normal heart sounds and intact distal pulses.   Pulmonary/Chest: Effort normal and breath sounds normal. No respiratory distress.  Abdominal: Soft. He exhibits no distension. There is no tenderness.  Genitourinary:  Small nonbleeding external hemorrhoid.  Very scant amount of bright red blood on gloved finger.  No melena.  No murmurs and colored stool.  No palpable internal hemorrhoids felt  Musculoskeletal: Normal range of motion.  Neurological: He is alert and oriented to person, place, and time.  Skin: Skin is warm and dry.  Psychiatric: He has a normal mood and affect. Judgment normal.    ED Course  Procedures (including critical care time) Labs Review Labs Reviewed  CBC - Abnormal; Notable for the following:    MCH 34.5 (*)    All other components within normal limits  COMPREHENSIVE METABOLIC PANEL - Abnormal; Notable for the following:    Glucose, Bld 119 (*)    Calcium 10.7 (*)    GFR calc non Af Amer 77 (*)  GFR calc Af Amer 90 (*)    All other components within normal limits  TYPE AND SCREEN  ABO/RH    Imaging Review No results found.   EKG Interpretation None      MDM   Final diagnoses:  Lower GI bleed    Lower GI bleed.  Stable vital signs.  Hemoglobin time.  No more rectal bleeding in the emergency department.  Suspect small internal hemorrhoid.    Lyanne CoKevin M Tameyah Koch, MD 01/08/14 [phone]

## 2014-01-08 NOTE — Discharge Instructions (Signed)

## 2014-01-16 ENCOUNTER — Ambulatory Visit: Payer: Self-pay

## 2014-01-17 ENCOUNTER — Emergency Department (HOSPITAL_COMMUNITY)
Admission: EM | Admit: 2014-01-17 | Discharge: 2014-01-18 | Discharge status: Against Medical Advice | Payer: Self-pay | Attending: Emergency Medicine | Admitting: Emergency Medicine

## 2014-01-17 ENCOUNTER — Encounter (HOSPITAL_COMMUNITY): Payer: Self-pay | Admitting: Emergency Medicine

## 2014-01-17 ENCOUNTER — Emergency Department (HOSPITAL_COMMUNITY)
Admission: EM | Admit: 2014-01-17 | Discharge: 2014-01-17 | Discharge status: Against Medical Advice | Payer: Self-pay | Attending: Emergency Medicine | Admitting: Emergency Medicine

## 2014-01-17 DIAGNOSIS — R209 Unspecified disturbances of skin sensation: Secondary | ICD-10-CM | POA: Insufficient documentation

## 2014-01-17 DIAGNOSIS — F411 Generalized anxiety disorder: Secondary | ICD-10-CM | POA: Insufficient documentation

## 2014-01-17 DIAGNOSIS — F1410 Cocaine abuse, uncomplicated: Secondary | ICD-10-CM | POA: Insufficient documentation

## 2014-01-17 DIAGNOSIS — F1210 Cannabis abuse, uncomplicated: Secondary | ICD-10-CM | POA: Insufficient documentation

## 2014-01-17 DIAGNOSIS — R51 Headache: Secondary | ICD-10-CM | POA: Insufficient documentation

## 2014-01-17 DIAGNOSIS — F17200 Nicotine dependence, unspecified, uncomplicated: Secondary | ICD-10-CM | POA: Insufficient documentation

## 2014-01-17 HISTORY — DX: Anxiety disorder, unspecified: F41.9

## 2014-01-17 NOTE — ED Notes (Signed)
Patient waited 31 mins and then left without telling anyone.

## 2014-01-17 NOTE — ED Notes (Signed)
Patient presents stating that he has been feeling like his heart has been racing and has had right hand and wrist numbness for the last day.  States he normally takes 4 Xanax daily and not had any in 2 days.  States has been told by the MD the he could have these feelings if he stops the Xanax with tapering them down

## 2014-01-17 NOTE — ED Notes (Signed)
Called for pt. To re-evaluate but no response.

## 2014-01-17 NOTE — ED Notes (Addendum)
Pt reports generalized numbness that comes and goes x 1 day. Pt also reports progressively worsening HA; took Aleve today with no relief. Pt states "I took Xanax for about a year and I stopped taking it four days ago." Pt also reports cocaine use yesterday, marijuana 3 days ago. Denies sweats or palpitations. Calm, cooperative in triage but noted to be pacing back and forth. Neuro intact. PERRLA, 6mm. Pt states he came yesterday to be seen, but states "yesterday this all went away on its own."

## 2014-01-18 ENCOUNTER — Encounter (HOSPITAL_COMMUNITY): Payer: Self-pay | Admitting: Emergency Medicine

## 2014-01-18 ENCOUNTER — Observation Stay (HOSPITAL_COMMUNITY)
Admission: EM | Admit: 2014-01-18 | Discharge: 2014-01-19 | Disposition: A | Discharge status: Home / Self Care | Payer: Self-pay | Attending: Internal Medicine | Admitting: Internal Medicine

## 2014-01-18 DIAGNOSIS — R51 Headache: Secondary | ICD-10-CM | POA: Insufficient documentation

## 2014-01-18 DIAGNOSIS — F1520 Other stimulant dependence, uncomplicated: Secondary | ICD-10-CM

## 2014-01-18 DIAGNOSIS — F13239 Sedative, hypnotic or anxiolytic dependence with withdrawal, unspecified: Secondary | ICD-10-CM | POA: Diagnosis present

## 2014-01-18 DIAGNOSIS — R5383 Other fatigue: Secondary | ICD-10-CM

## 2014-01-18 DIAGNOSIS — F1410 Cocaine abuse, uncomplicated: Secondary | ICD-10-CM

## 2014-01-18 DIAGNOSIS — F1910 Other psychoactive substance abuse, uncomplicated: Secondary | ICD-10-CM | POA: Diagnosis present

## 2014-01-18 DIAGNOSIS — F17200 Nicotine dependence, unspecified, uncomplicated: Secondary | ICD-10-CM | POA: Insufficient documentation

## 2014-01-18 DIAGNOSIS — R0789 Other chest pain: Secondary | ICD-10-CM | POA: Insufficient documentation

## 2014-01-18 DIAGNOSIS — R079 Chest pain, unspecified: Secondary | ICD-10-CM | POA: Diagnosis present

## 2014-01-18 DIAGNOSIS — Z8709 Personal history of other diseases of the respiratory system: Secondary | ICD-10-CM | POA: Insufficient documentation

## 2014-01-18 DIAGNOSIS — F329 Major depressive disorder, single episode, unspecified: Secondary | ICD-10-CM | POA: Insufficient documentation

## 2014-01-18 DIAGNOSIS — F19939 Other psychoactive substance use, unspecified with withdrawal, unspecified: Principal | ICD-10-CM | POA: Insufficient documentation

## 2014-01-18 DIAGNOSIS — R0602 Shortness of breath: Secondary | ICD-10-CM | POA: Insufficient documentation

## 2014-01-18 DIAGNOSIS — R5381 Other malaise: Secondary | ICD-10-CM | POA: Insufficient documentation

## 2014-01-18 DIAGNOSIS — R259 Unspecified abnormal involuntary movements: Secondary | ICD-10-CM | POA: Insufficient documentation

## 2014-01-18 DIAGNOSIS — Z79899 Other long term (current) drug therapy: Secondary | ICD-10-CM | POA: Insufficient documentation

## 2014-01-18 DIAGNOSIS — R42 Dizziness and giddiness: Secondary | ICD-10-CM | POA: Insufficient documentation

## 2014-01-18 DIAGNOSIS — F3289 Other specified depressive episodes: Secondary | ICD-10-CM | POA: Insufficient documentation

## 2014-01-18 DIAGNOSIS — F13939 Sedative, hypnotic or anxiolytic use, unspecified with withdrawal, unspecified: Secondary | ICD-10-CM | POA: Diagnosis present

## 2014-01-18 DIAGNOSIS — R202 Paresthesia of skin: Secondary | ICD-10-CM | POA: Diagnosis present

## 2014-01-18 DIAGNOSIS — F1310 Sedative, hypnotic or anxiolytic abuse, uncomplicated: Secondary | ICD-10-CM | POA: Insufficient documentation

## 2014-01-18 DIAGNOSIS — R002 Palpitations: Secondary | ICD-10-CM | POA: Insufficient documentation

## 2014-01-18 DIAGNOSIS — Z8669 Personal history of other diseases of the nervous system and sense organs: Secondary | ICD-10-CM | POA: Insufficient documentation

## 2014-01-18 HISTORY — DX: Gastro-esophageal reflux disease without esophagitis: K21.9

## 2014-01-18 LAB — COMPREHENSIVE METABOLIC PANEL
ALT: 12 U/L (ref 0–53)
AST: 15 U/L (ref 0–37)
Albumin: 4 g/dL (ref 3.5–5.2)
Alkaline Phosphatase: 57 U/L (ref 39–117)
BILIRUBIN TOTAL: 0.3 mg/dL (ref 0.3–1.2)
BUN: 17 mg/dL (ref 6–23)
CHLORIDE: 100 meq/L (ref 96–112)
CO2: 26 meq/L (ref 19–32)
CREATININE: 1.12 mg/dL (ref 0.50–1.35)
Calcium: 9.7 mg/dL (ref 8.4–10.5)
GFR calc Af Amer: >90 mL/min (ref >90)
GFR calc non Af Amer: 82 mL/min — ABNORMAL LOW (ref >90)
Glucose, Bld: 104 mg/dL — ABNORMAL HIGH (ref 70–99)
Potassium: 4 mEq/L (ref 3.7–5.3)
Sodium: 136 mEq/L — ABNORMAL LOW (ref 137–147)
Total Protein: 7.1 g/dL (ref 6.0–8.3)

## 2014-01-18 LAB — CBC WITH DIFFERENTIAL/PLATELET
Basophils Absolute: 0 10*3/uL (ref 0.0–0.1)
Basophils Relative: 1 % (ref 0–1)
Eosinophils Absolute: 0.2 10*3/uL (ref 0.0–0.7)
Eosinophils Relative: 4 % (ref 0–5)
HEMATOCRIT: 40.5 % (ref 39.0–52.0)
HEMOGLOBIN: 13.8 g/dL (ref 13.0–17.0)
LYMPHS ABS: 2.5 10*3/uL (ref 0.7–4.0)
LYMPHS PCT: 42 % (ref 12–46)
MCH: 33.3 pg (ref 26.0–34.0)
MCHC: 34.1 g/dL (ref 30.0–36.0)
MCV: 97.6 fL (ref 78.0–100.0)
MONOS PCT: 9 % (ref 3–12)
Monocytes Absolute: 0.5 10*3/uL (ref 0.1–1.0)
NEUTROS ABS: 2.7 10*3/uL (ref 1.7–7.7)
Neutrophils Relative %: 44 % (ref 43–77)
Platelets: 165 10*3/uL (ref 150–400)
RBC: 4.15 MIL/uL — AB (ref 4.22–5.81)
RDW: 12.2 % (ref 11.5–15.5)
WBC: 6 10*3/uL (ref 4.0–10.5)

## 2014-01-18 LAB — URINALYSIS, ROUTINE W REFLEX MICROSCOPIC
Bilirubin Urine: NEGATIVE
Glucose, UA: NEGATIVE mg/dL
Hgb urine dipstick: NEGATIVE
KETONES UR: NEGATIVE mg/dL
LEUKOCYTES UA: NEGATIVE
Nitrite: NEGATIVE
Protein, ur: NEGATIVE mg/dL
Specific Gravity, Urine: 1.026 (ref 1.005–1.030)
UROBILINOGEN UA: 0.2 mg/dL (ref 0.0–1.0)
pH: 6.5 (ref 5.0–8.0)

## 2014-01-18 LAB — RAPID URINE DRUG SCREEN, HOSP PERFORMED
Amphetamines: NOT DETECTED
Barbiturates: NOT DETECTED
Benzodiazepines: NOT DETECTED
Cocaine: POSITIVE — AB
OPIATES: NOT DETECTED
Tetrahydrocannabinol: NOT DETECTED

## 2014-01-18 LAB — TROPONIN I: Troponin I: <0.3 ng/mL (ref <0.30)

## 2014-01-18 MED ORDER — ASPIRIN 325 MG PO TABS
650.0000 mg | ORAL_TABLET | Freq: Four times a day (QID) | ORAL | Status: DC | PRN
Start: 2014-01-18 — End: 2014-01-19
  Filled 2014-01-18: qty 2

## 2014-01-18 MED ORDER — LORAZEPAM 1 MG PO TABS: 1.0000 mg | ORAL_TABLET | Freq: Four times a day (QID) | ORAL | Status: DC | PRN

## 2014-01-18 MED ORDER — SODIUM CHLORIDE 0.9 % IJ SOLN
3.0000 mL | Freq: Two times a day (BID) | INTRAMUSCULAR | Status: DC
  Administered 2014-01-18: 3 mL via INTRAVENOUS

## 2014-01-18 MED ORDER — ONDANSETRON HCL 4 MG/2ML IJ SOLN: 4.0000 mg | Freq: Four times a day (QID) | INTRAMUSCULAR | Status: DC | PRN

## 2014-01-18 MED ORDER — DIAZEPAM 5 MG PO TABS
5.0000 mg | ORAL_TABLET | Freq: Once | ORAL | Status: AC
  Administered 2014-01-18: 5 mg via ORAL
  Filled 2014-01-18: qty 1

## 2014-01-18 MED ORDER — SODIUM CHLORIDE 0.9 % IJ SOLN: 3.0000 mL | INTRAMUSCULAR | Status: DC | PRN

## 2014-01-18 MED ORDER — SODIUM CHLORIDE 0.9 % IV SOLN: 250.0000 mL | INTRAVENOUS | Status: DC | PRN

## 2014-01-18 MED ORDER — VITAMIN B-1 100 MG PO TABS
100.0000 mg | ORAL_TABLET | Freq: Every day | ORAL | Status: DC
  Administered 2014-01-18: 100 mg via ORAL
  Filled 2014-01-18 (×3): qty 1

## 2014-01-18 MED ORDER — ADULT MULTIVITAMIN W/MINERALS CH
1.0000 | ORAL_TABLET | Freq: Every day | ORAL | Status: DC
  Administered 2014-01-18: 1 via ORAL
  Filled 2014-01-18 (×2): qty 1

## 2014-01-18 MED ORDER — FOLIC ACID 1 MG PO TABS
1.0000 mg | ORAL_TABLET | Freq: Every day | ORAL | Status: DC
  Administered 2014-01-18: 1 mg via ORAL
  Filled 2014-01-18 (×2): qty 1

## 2014-01-18 MED ORDER — LORAZEPAM 2 MG/ML IJ SOLN
1.0000 mg | Freq: Four times a day (QID) | INTRAMUSCULAR | Status: DC | PRN
  Administered 2014-01-18: 1 mg via INTRAVENOUS
  Filled 2014-01-18: qty 1

## 2014-01-18 MED ORDER — HYDROCODONE-ACETAMINOPHEN 5-325 MG PO TABS: 1.0000 | ORAL_TABLET | ORAL | Status: DC | PRN

## 2014-01-18 MED ORDER — ACETAMINOPHEN 325 MG PO TABS: 650.0000 mg | ORAL_TABLET | Freq: Four times a day (QID) | ORAL | Status: DC | PRN

## 2014-01-18 MED ORDER — LORAZEPAM 1 MG PO TABS
0.0000 mg | ORAL_TABLET | Freq: Four times a day (QID) | ORAL | Status: DC
  Filled 2014-01-18: qty 1

## 2014-01-18 MED ORDER — ACETAMINOPHEN 650 MG RE SUPP: 650.0000 mg | Freq: Four times a day (QID) | RECTAL | Status: DC | PRN

## 2014-01-18 MED ORDER — THIAMINE HCL 100 MG/ML IJ SOLN
100.0000 mg | Freq: Every day | INTRAMUSCULAR | Status: DC
  Filled 2014-01-18 (×2): qty 1

## 2014-01-18 MED ORDER — LORAZEPAM 1 MG PO TABS: 0.0000 mg | ORAL_TABLET | Freq: Two times a day (BID) | ORAL | Status: DC

## 2014-01-18 MED ORDER — ONDANSETRON HCL 4 MG PO TABS: 4.0000 mg | ORAL_TABLET | Freq: Four times a day (QID) | ORAL | Status: DC | PRN

## 2014-01-18 MED ORDER — DOCUSATE SODIUM 100 MG PO CAPS
100.0000 mg | ORAL_CAPSULE | Freq: Two times a day (BID) | ORAL | Status: DC
  Administered 2014-01-18 (×2): 100 mg via ORAL
  Filled 2014-01-18 (×4): qty 1

## 2014-01-18 MED ORDER — ENOXAPARIN SODIUM 40 MG/0.4ML ~~LOC~~ SOLN
40.0000 mg | SUBCUTANEOUS | Status: DC
  Administered 2014-01-18: 40 mg via SUBCUTANEOUS
  Filled 2014-01-18 (×2): qty 0.4

## 2014-01-18 NOTE — ED Notes (Addendum)
Pt. Was just seen yesterday for same symptoms except he had a headache at that time. He doesn't have one now.. States that his right hand, right wrist, right foot, right ankle and behind his right knee is numb like earlier today. According to chart Pt. Left AMA on 01/17/14 after waiting 31min. For same symptoms. He had been taking xanax QID and had stopped taking it and had been told these could possibly be the symptoms by his MD if he did not wean off med.

## 2014-01-18 NOTE — Progress Notes (Signed)
I have seen and assessed patient and agree with Dr Doutova's assessment and plan. 

## 2014-01-18 NOTE — Progress Notes (Signed)
UR Completed.  Delano Frate Jane Ishaan Villamar [phone] 01/18/2014  

## 2014-01-18 NOTE — H&P (Signed)
PCP: No PCP Per Patient   Chief Complaint:  Occasional numbness of right side and headaches  HPI: Henry Stanley is a 38 y.o. male   has a past medical history of Cocaine abuse; Depression; Seizures; Polysubstance abuse; Sinus infection; and Anxiety.   Presented with  Patient has hx of anxiety have been self medicating with xanax (taking pieces of a "bar") he would go through a "bar" a day. Have been heavily using cocaine as well. Patient decided to quit using drugs and presented to ER with tremors and stating he was worried he is going to have seizure. He have had seizures in the past due to cocaine use.  Patient drinks on occasion. In ER he ws given valium 5 mg and became somnolent. Unable to obtain history from him. Girlfriend at bedside.  denies slurred speech, no gait abnormality no weakness. Girlfriend states he have had occasional chest pain.   Hospitalist was called for admission for benzodiazapine withdrawal  Review of Systems:    Pertinent positives include:  Tremor, tingling on the right side  Constitutional:  No weight loss, night sweats, Fevers, chills, fatigue, weight loss  HEENT:  No headaches, Difficulty swallowing,Tooth/dental problems,Sore throat,  No sneezing, itching, ear ache, nasal congestion, post nasal drip,  Cardio-vascular:  No chest pain, Orthopnea, PND, anasarca, dizziness, palpitations.no Bilateral lower extremity swelling  GI:  No heartburn, indigestion, abdominal pain, nausea, vomiting, diarrhea, change in bowel habits, loss of appetite, melena, blood in stool, hematemesis Resp:  no shortness of breath at rest. No dyspnea on exertion, No excess mucus, no productive cough, No non-productive cough, No coughing up of blood.No change in color of mucus.No wheezing. Skin:  no rash or lesions. No jaundice GU:  no dysuria, change in color of urine, no urgency or frequency. No straining to urinate.  No flank pain.  Musculoskeletal:  No joint pain or no joint  swelling. No decreased range of motion. No back pain.  Psych:  No change in mood or affect. No depression or anxiety. No memory loss.  Neuro: no localizing neurological complaints, no , no weakness, no double vision, no gait abnormality, no slurred speech, no confusion  Otherwise ROS are negative except for above, 10 systems were reviewed  Past Medical History: Past Medical History  Diagnosis Date  . Cocaine abuse   . Depression   . Seizures   . Polysubstance abuse   . Sinus infection   . Anxiety    Past Surgical History  Procedure Laterality Date  . Femur surgery       Medications: Prior to Admission medications   Medication Sig Start Date End Date Taking? Authorizing Provider  aspirin 325 MG tablet Take 650 mg by mouth every 6 (six) hours as needed for moderate pain.   Yes Historical Provider, MD  calcium carbonate (TUMS) 500 MG chewable tablet Chew 4 tablets by mouth 2 (two) times daily as needed for indigestion or heartburn.   Yes Historical Provider, MD  naproxen sodium (ANAPROX) 220 MG tablet Take 440 mg by mouth 2 (two) times daily as needed (for headache, pain).   Yes Historical Provider, MD    Allergies:   Allergies  Allergen Reactions  . Sumatriptan Shortness Of Breath, Nausea And Vomiting, Palpitations and Other (See Comments)    Increased BP also    Social History:  Ambulatory  Independently  Lives at home  With family   reports that he has been smoking Cigarettes.  He has been smoking about 0.50 packs  per day. He does not have any smokeless tobacco history on file. He reports that he drinks alcohol. He reports that he uses illicit drugs (Cocaine, Marijuana, and Benzodiazepines).    Family History: family history is not on file.    Physical Exam: Patient Vitals for the past 24 hrs:  BP Temp Temp src Pulse Resp SpO2  01/18/14 0530 107/66 mmHg - - 80 15 97 %  01/18/14 0500 115/83 mmHg - - 76 11 96 %  01/18/14 0445 111/77 mmHg - - 80 17 95 %  01/18/14  0430 119/81 mmHg - - 78 17 97 %  01/18/14 0415 116/77 mmHg - - 80 - 96 %  01/18/14 0400 117/72 mmHg - - 77 - 95 %  01/18/14 0345 124/75 mmHg - - 76 - 96 %  01/18/14 0330 110/70 mmHg - - 77 - 98 %  01/18/14 0315 116/80 mmHg - - 79 - 92 %  01/18/14 0300 127/73 mmHg 98.7 F (37.1 C) Oral 79 18 98 %    1. General:  in No Acute distress 2. Psychological: Alert and Oriented 3. Head/ENT:   Moist  Mucous Membranes                          Head Non traumatic, neck supple                          Normal  Dentition 4. SKIN: normal Skin turgor,  Skin clean Dry and intact no rash 5. Heart: Regular rate and rhythm no Murmur, Rub or gallop 6. Lungs: Clear to auscultation bilaterally, no wheezes or crackles   7. Abdomen: Soft, non-tender, Non distended 8. Lower extremities: no clubbing, cyanosis, or edema 9. Neurologically strength 5 out of 5 in all 4 ext CN 2-12 intact 10. MSK: Normal range of motion  body mass index is unknown because there is no weight on file.   Labs on Admission:   Recent Labs  01/18/14 0409  NA 136*  K 4.0  CL 100  CO2 26  GLUCOSE 104*  BUN 17  CREATININE 1.12  CALCIUM 9.7    Recent Labs  01/18/14 0409  AST 15  ALT 12  ALKPHOS 57  BILITOT 0.3  PROT 7.1  ALBUMIN 4.0   No results found for this basename: LIPASE, AMYLASE,  in the last 72 hours  Recent Labs  01/18/14 0409  WBC 6.0  NEUTROABS 2.7  HGB 13.8  HCT 40.5  MCV 97.6  PLT 165   No results found for this basename: CKTOTAL, CKMB, CKMBINDEX, TROPONINI,  in the last 72 hours No results found for this basename: TSH, T4TOTAL, FREET3, T3FREE, THYROIDAB,  in the last 72 hours No results found for this basename: VITAMINB12, FOLATE, FERRITIN, TIBC, IRON, RETICCTPCT,  in the last 72 hours No results found for this basename: HGBA1C    The CrCl is unknown because both a height and weight (above a minimum accepted value) are required for this calculation. ABG    Component Value Date/Time   PHART  7.437 12/16/2012 0255   HCO3 23.6 12/16/2012 0255   TCO2 24.7 12/16/2012 0255   ACIDBASEDEF 0.1 12/16/2012 0255   O2SAT 100.0 12/16/2012 0255     Lab Results  Component Value Date   DDIMER  Value: 0.30        AT THE INHOUSE ESTABLISHED CUTOFF VALUE OF 0.48 ug/mL FEU, THIS ASSAY HAS BEEN DOCUMENTED IN  THE LITERATURE TO HAVE A SENSITIVITY AND NEGATIVE PREDICTIVE VALUE OF AT LEAST 98 TO 99%.  THE TEST RESULT SHOULD BE CORRELATED WITH AN ASSESSMENT OF THE CLINICAL PROBABILITY OF DVT / VTE. 01/27/2010     Other results:  I have pearsonaly reviewed this: ECG REPORT  Rate: 79  Rhythm: SR ST&T Change: ST segment elevation in v3, early repol  UA no evidence of infection  BNP (last 3 results)  Recent Labs  10/23/13 0042  PROBNP <5.0    There were no vitals filed for this visit.   Cultures:    Component Value Date/Time   SDES URINE, CATHETERIZED 12/16/2012 0155   SPECREQUEST NONE 12/16/2012 0155   CULT NO GROWTH 12/16/2012 0155   REPTSTATUS 12/17/2012 FINAL 12/16/2012 0155     Radiological Exams on Admission: No results found.  Chart has been reviewed  Assessment/Plan 38 yo M with hx of drug abuse here with chest pain in the setting of cocaine use and benzodiazepine withdrawal   Present on Admission:  . Chest pain - in the setting of cocaine use, abnormal ECG but no change from prior, cycle CE, obtain echo to eval for cardiomyaopathy . Polysubstance abuse - social work consult . Benzodiazepine withdrawal - CIWA protocol may benefit from psych follow up . Paresthesia - in the setting of cocaine use and anxiety, neurologically intact   Prophylaxis: lovenox, Protonix  CODE STATUS:  FULL CODE    Other plan as per orders.  I have spent a total of 55 min on this admission  Linley Moskal 01/18/2014, 5:37 AM

## 2014-01-18 NOTE — ED Notes (Signed)
Pt. States that around 0230 this am he woke up with heart pounding real fast, shaking, dizzy and felt like he was going to have a seizure. Pt. States he has had 2 seizures in the past after swallowing a lot of cocaine. Denies drug use now.

## 2014-01-18 NOTE — ED Provider Notes (Signed)
CSN: [id number]     Arrival date & time 01/18/14  0256 History   First MD Initiated Contact with Patient 01/18/14 0326     Chief Complaint  Patient presents with  . Panic Attack     (Consider location/radiation/quality/duration/timing/severity/associated sxs/prior Treatment) HPI Comments: 38 y/o male with hx of chronic xanax abuse (about 4 mg / day), off and on for about a year now comes in with cc of anxiety. States that he last use xanax 2 days ago. He started having some symptoms yday, with tremors and shakiness, but overnight, he woke up with this night with chest pounding, shaking, dizziness, sweats and felt like he was going to have a seizure. + cocaine abuse in the past, not using any currently.  The history is provided by the patient.    Past Medical History  Diagnosis Date  . Cocaine abuse   . Depression   . Seizures   . Polysubstance abuse   . Sinus infection   . Anxiety    Past Surgical History  Procedure Laterality Date  . Femur surgery     No family history on file. History  Substance Use Topics  . Smoking status: Current Every Day Smoker -- 0.50 packs/day    Types: Cigarettes  . Smokeless tobacco: Not on file  . Alcohol Use: Yes    Review of Systems  Constitutional: Positive for fatigue. Negative for activity change and appetite change.  Respiratory: Positive for shortness of breath. Negative for cough.   Cardiovascular: Positive for palpitations. Negative for chest pain.  Gastrointestinal: Negative for abdominal pain.  Genitourinary: Negative for dysuria.  Neurological: Positive for dizziness, tremors, weakness and light-headedness. Negative for seizures and speech difficulty.  Psychiatric/Behavioral: Negative for hallucinations, sleep disturbance and self-injury.  All other systems reviewed and are negative.     Allergies  Sumatriptan  Home Medications   Prior to Admission medications   Medication Sig Start Date End Date Taking? Authorizing  Provider  ALPRAZolam Prudy Feeler(XANAX) 1 MG tablet Take 1 mg by mouth daily.    Historical Provider, MD  calcium carbonate (TUMS) 500 MG chewable tablet Chew 4 tablets by mouth 2 (two) times daily as needed for indigestion or heartburn.    Historical Provider, MD  naproxen sodium (ANAPROX) 220 MG tablet Take 440 mg by mouth 2 (two) times daily as needed (for headache, pain).    Historical Provider, MD   BP 110/70  Pulse 77  Temp(Src) 98.7 F (37.1 C) (Oral)  Resp 18  SpO2 98% Physical Exam  Nursing note and vitals reviewed. Constitutional: He is oriented to person, place, and time. He appears well-developed.  HENT:  Head: Normocephalic and atraumatic.  Eyes: Conjunctivae and EOM are normal. Pupils are equal, round, and reactive to light.  Neck: Normal range of motion. Neck supple.  Cardiovascular: Normal rate and regular rhythm.   Pulmonary/Chest: Effort normal and breath sounds normal.  Abdominal: Soft. Bowel sounds are normal. He exhibits no distension. There is no tenderness. There is no rebound and no guarding.  Neurological: He is alert and oriented to person, place, and time.  Skin: Skin is warm.    ED Course  Procedures (including critical care time) Labs Review Labs Reviewed  CBC WITH DIFFERENTIAL - Abnormal; Notable for the following:    RBC 4.15 (*)    All other components within normal limits  COMPREHENSIVE METABOLIC PANEL - Abnormal; Notable for the following:    Sodium 136 (*)    Glucose, Bld 104 (*)  GFR calc non Af Amer 82 (*)    All other components within normal limits  URINALYSIS, ROUTINE W REFLEX MICROSCOPIC - Abnormal; Notable for the following:    APPearance HAZY (*)    All other components within normal limits  URINE RAPID DRUG SCREEN (HOSP PERFORMED)    Imaging Review No results found.   EKG Interpretation None      Date: 01/18/2014  Rate: 79  Rhythm: normal sinus rhythm  QRS Axis: normal  Intervals: normal  ST/T Wave abnormalities: normal   Conduction Disutrbances: none  Narrative Interpretation: unremarkable     MDM   Final diagnoses:  Benzodiazepine withdrawal    Pt comes in with cc of anxiety, palpation, chest discomfort, headaches, and seizure like aura. Stopped 4 mg of xanax 2 days ago. Pt is going through acute benzo withdrawal. He appears hemodynamically and neurologically stable. Given the severity of his sx, and potential for benzo withdrawals to be life threatening, we will admit him.  I gave him 5 mg oral valium, and he looks better now than he did when he first came in.      Derwood KaplanAnkit Solangel Mcmanaway, MD 01/18/14 [phone]

## 2014-01-18 NOTE — Clinical Social Work Psychosocial (Signed)
Clinical Social Work Department BRIEF PSYCHOSOCIAL ASSESSMENT 01/18/2014  Patient:  Henry Stanley, Henry Stanley     Account Number:  [id number]     [place] date:  01/18/2014  Clinical Social Worker:  Hubert Azure  Date/Time:  01/18/2014 01:14 PM  Referred by:  Physician  Date Referred:  01/18/2014 Referred for  Substance Abuse   Other Referral:   Interview type:  Patient Other interview type:    PSYCHOSOCIAL DATA Living Status:  SIGNIFICANT OTHER Admitted from facility:   Level of care:   Primary support name:  Izell [place] Primary support relationship to patient:  PARTNER Degree of support available:   Good. Patient girlfriend lying in bed with him.    CURRENT CONCERNS Current Concerns  Substance Abuse   Other Concerns:    SOCIAL WORK ASSESSMENT / PLAN CSW met with patient who was alert and oriented x4. Patient's girlfriend was observed lying in bed bedside patient asleep. CSW introduced self and explained role. CSW and patient discussed substance abuse. Per patient he has been using between 1-2 grams of cocaine everyday for 5 years. Patient denied ever harming himself or harming anyone while under the influence of drugs. Patient further denied any difficulty in performing ADL while using drugs. Patient stated he takes Xanax bars for anxiety (not diagnosed). Per patient, he buys the bars off the street. CSW and patient discussed feelings of anxiety associated with cocaine use. CSW further discussed how cocaine affects the heart, lungs, brain, intestinal tract, and kidneys, as well as reduced feelings of pleasure. Patient stated he would like to stop using drugs in the future, but does not feel the need to stop at this moment. Patient stated he has never tried to stop using drugs. Per patient, he is not working and he and his girlfriend live together.   Assessment/plan status:  Information/Referral to [employer] Other assessment/ plan:   Information/referral to  community resources:   CSW provided patient with Tourist information centre manager for outpatient and inpatient substance abuse programs and outpatient mental health and counseling programs.    PATIENT'S/FAMILY'S RESPONSE TO PLAN OF CARE: Patient thanked CSW and stated he will consider stopping drug use one day.   [place], [place] Weekend Clinical Social Worker [phone]

## 2014-01-19 LAB — COMPREHENSIVE METABOLIC PANEL
ALBUMIN: 4 g/dL (ref 3.5–5.2)
ALT: 13 U/L (ref 0–53)
AST: 15 U/L (ref 0–37)
Alkaline Phosphatase: 54 U/L (ref 39–117)
BILIRUBIN TOTAL: 0.4 mg/dL (ref 0.3–1.2)
BUN: 16 mg/dL (ref 6–23)
CALCIUM: 9.6 mg/dL (ref 8.4–10.5)
CHLORIDE: 103 meq/L (ref 96–112)
CO2: 24 mEq/L (ref 19–32)
CREATININE: 1.13 mg/dL (ref 0.50–1.35)
GFR calc Af Amer: >90 mL/min (ref >90)
GFR, EST NON AFRICAN AMERICAN: 82 mL/min — AB (ref >90)
Glucose, Bld: 100 mg/dL — ABNORMAL HIGH (ref 70–99)
Potassium: 4.2 mEq/L (ref 3.7–5.3)
Sodium: 140 mEq/L (ref 137–147)
Total Protein: 7 g/dL (ref 6.0–8.3)

## 2014-01-19 LAB — TSH: TSH: 0.56 u[IU]/mL (ref 0.350–4.500)

## 2014-01-19 LAB — CBC
HCT: 43.1 % (ref 39.0–52.0)
HEMOGLOBIN: 14.6 g/dL (ref 13.0–17.0)
MCH: 32.9 pg (ref 26.0–34.0)
MCHC: 33.9 g/dL (ref 30.0–36.0)
MCV: 97.1 fL (ref 78.0–100.0)
Platelets: 167 10*3/uL (ref 150–400)
RBC: 4.44 MIL/uL (ref 4.22–5.81)
RDW: 12.3 % (ref 11.5–15.5)
WBC: 5.6 10*3/uL (ref 4.0–10.5)

## 2014-01-19 LAB — MAGNESIUM: Magnesium: 2.1 mg/dL (ref 1.5–2.5)

## 2014-01-19 LAB — PHOSPHORUS: Phosphorus: 3.7 mg/dL (ref 2.3–4.6)

## 2014-01-19 NOTE — Progress Notes (Signed)
Pt refusing to stay and wait for dc papers. Monitor removed. IV dc with tip intact. AMA papers signed. Pt left with girlfriend. Levonne Spillerhasidy Lima Chillemi, RN

## 2014-01-31 ENCOUNTER — Emergency Department (HOSPITAL_COMMUNITY)
Admission: EM | Admit: 2014-01-31 | Discharge: 2014-01-31 | Disposition: A | Discharge status: Home / Self Care | Payer: Self-pay | Attending: Emergency Medicine | Admitting: Emergency Medicine

## 2014-01-31 ENCOUNTER — Encounter (HOSPITAL_COMMUNITY): Payer: Self-pay | Admitting: Emergency Medicine

## 2014-01-31 ENCOUNTER — Emergency Department (HOSPITAL_COMMUNITY): Payer: Self-pay

## 2014-01-31 DIAGNOSIS — F1410 Cocaine abuse, uncomplicated: Secondary | ICD-10-CM | POA: Insufficient documentation

## 2014-01-31 DIAGNOSIS — F1490 Cocaine use, unspecified, uncomplicated: Secondary | ICD-10-CM

## 2014-01-31 DIAGNOSIS — R079 Chest pain, unspecified: Secondary | ICD-10-CM

## 2014-01-31 DIAGNOSIS — R002 Palpitations: Secondary | ICD-10-CM | POA: Insufficient documentation

## 2014-01-31 DIAGNOSIS — Z8709 Personal history of other diseases of the respiratory system: Secondary | ICD-10-CM | POA: Insufficient documentation

## 2014-01-31 DIAGNOSIS — Z8659 Personal history of other mental and behavioral disorders: Secondary | ICD-10-CM | POA: Insufficient documentation

## 2014-01-31 DIAGNOSIS — Z8669 Personal history of other diseases of the nervous system and sense organs: Secondary | ICD-10-CM | POA: Insufficient documentation

## 2014-01-31 DIAGNOSIS — R072 Precordial pain: Secondary | ICD-10-CM | POA: Insufficient documentation

## 2014-01-31 DIAGNOSIS — F17200 Nicotine dependence, unspecified, uncomplicated: Secondary | ICD-10-CM | POA: Insufficient documentation

## 2014-01-31 DIAGNOSIS — K219 Gastro-esophageal reflux disease without esophagitis: Secondary | ICD-10-CM | POA: Insufficient documentation

## 2014-01-31 LAB — CBC
HCT: 42.3 % (ref 39.0–52.0)
Hemoglobin: 15 g/dL (ref 13.0–17.0)
MCH: 33.5 pg (ref 26.0–34.0)
MCHC: 35.5 g/dL (ref 30.0–36.0)
MCV: 94.4 fL (ref 78.0–100.0)
Platelets: 187 10*3/uL (ref 150–400)
RBC: 4.48 MIL/uL (ref 4.22–5.81)
RDW: 11.8 % (ref 11.5–15.5)
WBC: 7.7 10*3/uL (ref 4.0–10.5)

## 2014-01-31 LAB — I-STAT TROPONIN, ED: TROPONIN I, POC: 0 ng/mL (ref 0.00–0.08)

## 2014-01-31 LAB — BASIC METABOLIC PANEL
BUN: 16 mg/dL (ref 6–23)
CALCIUM: 10.5 mg/dL (ref 8.4–10.5)
CO2: 26 mEq/L (ref 19–32)
CREATININE: 1.13 mg/dL (ref 0.50–1.35)
Chloride: 101 mEq/L (ref 96–112)
GFR calc Af Amer: >90 mL/min (ref >90)
GFR, EST NON AFRICAN AMERICAN: 82 mL/min — AB (ref >90)
Glucose, Bld: 105 mg/dL — ABNORMAL HIGH (ref 70–99)
Potassium: 4.2 mEq/L (ref 3.7–5.3)
Sodium: 139 mEq/L (ref 137–147)

## 2014-01-31 MED ORDER — LORAZEPAM 2 MG/ML IJ SOLN
0.5000 mg | Freq: Once | INTRAMUSCULAR | Status: AC
  Administered 2014-01-31: 0.5 mg via INTRAVENOUS
  Filled 2014-01-31: qty 1

## 2014-01-31 MED ORDER — SODIUM CHLORIDE 0.9 % IV BOLUS (SEPSIS)
500.0000 mL | Freq: Once | INTRAVENOUS | Status: AC
  Administered 2014-01-31: 500 mL via INTRAVENOUS

## 2014-01-31 NOTE — Discharge Instructions (Signed)
Chest Pain (Nonspecific) °Chest pain has many causes. Your pain could be caused by something serious, such as a heart attack or a blood clot in the lungs. It could also be caused by something less serious, such as a chest bruise or a virus. Follow up with your doctor. More lab tests or other studies may be needed to find the cause of your pain. Most of the time, nonspecific chest pain will improve within 2 to 3 days of rest and mild pain medicine. °HOME CARE °· For chest bruises, you may put ice on the sore area for 15-20 minutes, 03-04 times a day. Do this only if it makes you feel better. °· Put ice in a plastic bag. °· Place a towel between the skin and the bag. °· Rest for the next 2 to 3 days. °· Go back to work if the pain improves. °· See your doctor if the pain lasts longer than 1 to 2 weeks. °· Only take medicine as told by your doctor. °· Quit smoking if you smoke. °GET HELP RIGHT AWAY IF:  °· There is more pain or pain that spreads to the arm, neck, jaw, back, or belly (abdomen). °· You have shortness of breath. °· You cough more than usual or cough up blood. °· You have very bad back or belly pain, feel sick to your stomach (nauseous), or throw up (vomit). °· You have very bad weakness. °· You pass out (faint). °· You have a fever. °Any of these problems may be serious and may be an emergency. Do not wait to see if the problems will go away. Get medical help right away. Call your local emergency services 911 in [place] Do not drive yourself to the hospital. °MAKE SURE YOU:  °· Understand these instructions. °· Will watch this condition. °· Will get help right away if you or your child is not doing well or gets worse. °Document Released: 02/14/2008 Document Revised: 11/20/2011 Document Reviewed: 02/14/2008 °[employer]® Patient Information ©2014 [employer]. ° °

## 2014-01-31 NOTE — ED Notes (Signed)
Pt last use of cocaine was 0300 this am. Pt is unsure if shortness of breath is present with his left sided chest pain. He reports to me that his last visit he left AMA but they wanted him to stay to do an [place] of heart.

## 2014-01-31 NOTE — ED Provider Notes (Signed)
CSN: [id number]     Arrival date & time 01/31/14  1819 History   First MD Initiated Contact with Patient 01/31/14 1826     Chief Complaint  Patient presents with  . Chest Pain     (Consider location/radiation/quality/duration/timing/severity/associated sxs/prior Treatment) Patient is a 38 y.o. male presenting with chest pain.  Chest Pain Pain location:  Substernal area Pain quality: sharp   Pain radiates to:  Does not radiate Pain radiates to the back: no   Pain severity:  Mild Onset quality:  Sudden Duration:  1 hour Timing:  Intermittent Progression:  Resolved Chronicity:  New Context comment:  During sex Relieved by:  Nothing Worsened by:  Nothing tried Ineffective treatments:  None tried Associated symptoms: palpitations   Associated symptoms: no abdominal pain, no cough, no fever, no headache, no nausea, no numbness, no shortness of breath and not vomiting     Past Medical History  Diagnosis Date  . Cocaine abuse   . Depression   . Seizures   . Polysubstance abuse   . Sinus infection   . Anxiety   . GERD (gastroesophageal reflux disease)    Past Surgical History  Procedure Laterality Date  . Femur surgery     No family history on file. History  Substance Use Topics  . Smoking status: Current Every Day Smoker -- 0.50 packs/day    Types: Cigarettes  . Smokeless tobacco: Not on file  . Alcohol Use: Yes     Comment: does not drink on daily bases but has binges    Review of Systems  Constitutional: Negative for fever.  HENT: Negative for drooling and rhinorrhea.   Eyes: Negative for pain.  Respiratory: Negative for cough and shortness of breath.   Cardiovascular: Positive for chest pain and palpitations. Negative for leg swelling.  Gastrointestinal: Negative for nausea, vomiting, abdominal pain and diarrhea.  Genitourinary: Negative for dysuria and hematuria.  Musculoskeletal: Negative for gait problem and neck pain.  Skin: Negative for color change.   Neurological: Negative for numbness and headaches.  Hematological: Negative for adenopathy.  Psychiatric/Behavioral: Negative for behavioral problems.  All other systems reviewed and are negative.     Allergies  Sumatriptan  Home Medications   Prior to Admission medications   Medication Sig Start Date End Date Taking? Authorizing Provider  aspirin 325 MG tablet Take 650 mg by mouth every 6 (six) hours as needed for moderate pain.    Historical Provider, MD  calcium carbonate (TUMS) 500 MG chewable tablet Chew 4 tablets by mouth 2 (two) times daily as needed for indigestion or heartburn.    Historical Provider, MD  naproxen sodium (ANAPROX) 220 MG tablet Take 440 mg by mouth 2 (two) times daily as needed (for headache, pain).    Historical Provider, MD   BP 133/96  Pulse 109  Temp(Src) 98 F (36.7 C) (Oral)  Resp 21  SpO2 100% Physical Exam  Nursing note and vitals reviewed. Constitutional: He is oriented to person, place, and time. He appears well-developed and well-nourished.  HENT:  Head: Normocephalic and atraumatic.  Right Ear: External ear normal.  Left Ear: External ear normal.  Nose: Nose normal.  Mouth/Throat: Oropharynx is clear and moist. No oropharyngeal exudate.  Eyes: Conjunctivae and EOM are normal. Pupils are equal, round, and reactive to light.  Neck: Normal range of motion. Neck supple.  Cardiovascular: Normal rate, regular rhythm, normal heart sounds and intact distal pulses.  Exam reveals no gallop and no friction rub.   No  murmur heard. Pulmonary/Chest: Effort normal and breath sounds normal. No respiratory distress. He has no wheezes.  Abdominal: Soft. Bowel sounds are normal. He exhibits no distension. There is no tenderness. There is no rebound and no guarding.  Musculoskeletal: Normal range of motion. He exhibits no edema and no tenderness.  Neurological: He is alert and oriented to person, place, and time.  Skin: Skin is warm and dry.   Psychiatric: He has a normal mood and affect. His behavior is normal.    ED Course  Procedures (including critical care time) Labs Review Labs Reviewed  BASIC METABOLIC PANEL - Abnormal; Notable for the following:    Glucose, Bld 105 (*)    GFR calc non Af Amer 82 (*)    All other components within normal limits  CBC  I-STAT TROPOININ, ED    Imaging Review Dg Chest Port 1 View  01/31/2014   CLINICAL DATA:  Left-sided chest pain.  EXAM: PORTABLE CHEST - 1 VIEW  COMPARISON:  10/23/2013  FINDINGS: The heart size and mediastinal contours are within normal limits. Both lungs are clear. The visualized skeletal structures are unremarkable.  IMPRESSION: Normal chest.   Electronically Signed   By: Geanie CooleyJim  Maxwell M.D.   On: 01/31/2014 19:11     EKG Interpretation   Date/Time:  Saturday Jan 31 2014 18:25:59 EDT Ventricular Rate:  124 PR Interval:  161 QRS Duration: 86 QT Interval:  299 QTC Calculation: 429 R Axis:   164 Text Interpretation:  Age not entered, assumed to be  38 years old for  purpose of ECG interpretation Sinus tachycardia Ventricular premature  complex Aberrant conduction of SV complex(es) Consider right atrial  enlargement Right ventricular hypertrophy ST elevation suggests acute  pericarditis Confirmed by Vicktoria Muckey  MD, Alarik Radu ([id number]) on 01/31/2014  6:54:42 PM      MDM   Final diagnoses:  Chest pain  Cocaine use    7:08 PM 38 y.o. male who presents with chest pain. He states that he was taking alcohol and using cocaine last night. Last last use was approximately 4 AM. He states that he was having sex approximately 1.5 hours ago when he started getting a pinching sensation in his chest and palpitations. He notes that he had intermittent pinching sensation for the last hour but it has resolved. He is currently asymptomatic. He is afebrile and mildly tachycardic here. He denies sob. Will get screening labs and 0.5mg  ativan.   8:40 PM: I interpreted/reviewed the labs  and/or imaging which were non-contributory.  Low risk for MACE per HEART score. Pt remains asx. I have discussed the diagnosis/risks/treatment options with the patient and believe the pt to be eligible for discharge home to follow-up with and establish w/ a pcp. We also discussed returning to the ED immediately if new or worsening sx occur. We discussed the sx which are most concerning (e.g., return of pain, fever, sob) that necessitate immediate return. Medications administered to the patient during their visit and any new prescriptions provided to the patient are listed below.  Medications given during this visit Medications  LORazepam (ATIVAN) injection 0.5 mg (0.5 mg Intravenous Given 01/31/14 1909)  sodium chloride 0.9 % bolus 500 mL (0 mLs Intravenous Stopped 01/31/14 1939)    New Prescriptions   No medications on file     Junius ArgyleForrest S Jozey Janco, MD 01/31/14 2334

## 2014-01-31 NOTE — ED Notes (Signed)
He c/o left-sided chest pain, plus weakness and lightheadedness which began ~40 min. P.t.a.  He states he uses cocaine; and last used cocaine yesterday.  He is in no distress.  EKG performed at triage.

## 2014-02-04 ENCOUNTER — Encounter (HOSPITAL_COMMUNITY): Payer: Self-pay | Admitting: Emergency Medicine

## 2014-02-04 ENCOUNTER — Emergency Department (HOSPITAL_COMMUNITY)
Admission: EM | Admit: 2014-02-04 | Discharge: 2014-02-04 | Discharge status: Against Medical Advice | Payer: Self-pay | Attending: Emergency Medicine | Admitting: Emergency Medicine

## 2014-02-04 DIAGNOSIS — F17200 Nicotine dependence, unspecified, uncomplicated: Secondary | ICD-10-CM | POA: Insufficient documentation

## 2014-02-04 DIAGNOSIS — R51 Headache: Secondary | ICD-10-CM | POA: Insufficient documentation

## 2014-02-04 DIAGNOSIS — R259 Unspecified abnormal involuntary movements: Secondary | ICD-10-CM | POA: Insufficient documentation

## 2014-02-04 NOTE — ED Notes (Signed)
Patient left before this RN or Trixie Dredge, PA, or Fayrene Fearing, MD, evaluated patient. Patient walked out of ED, telling triage ED staff that he had been discharged. Patient has not been discharged, patient left against medical advice. Patient was seen and triaged by triage RN.

## 2014-02-04 NOTE — Progress Notes (Signed)
[facility] provided pt with a list of primary care resources to help patient establish a pcp.  °

## 2014-02-04 NOTE — ED Notes (Signed)
Pt c/o of headache that started around 1330 today and has since decreased being here. Pt also c/o that his left facial muscles were twitching but have since stopped. Pt states that been having headache for over month.

## 2014-02-19 NOTE — Discharge Summary (Signed)
Physician Discharge Summary  Henry MottsKywan D Weatherspoon ZOX:[id number] DOB: [birth year] DOA: 01/18/2014       Patient left AGAINST MEDICAL ADVICE PCP: No PCP Per Patient  Admit date: 01/18/2014 Discharge date: 01/19/2014  Time spent: 60 minutes  Recommendations for Outpatient Follow-up:  1. Patient left AGAINST MEDICAL ADVICE. Hopefully he will follow up with PCP as outpatient.  Discharge Diagnoses:  Active Problems:   Polysubstance abuse   Benzodiazepine withdrawal   Chest pain   Paresthesia   Discharge Condition: Stable  Diet recommendation: Regular  Filed Weights   01/19/14 0500  Weight: 72.077 kg (158 lb 14.4 oz)    History of present illness:  Henry Stanley is a 38 y.o. male  has a past medical history of Cocaine abuse; Depression; Seizures; Polysubstance abuse; Sinus infection; and Anxiety.  Presented with  Patient has hx of anxiety have been self medicating with xanax (taking pieces of a "bar") he would go through a "bar" a day. Have been heavily using cocaine as well. Patient decided to quit using drugs and presented to ER with tremors and stating he was worried he is going to have seizure. He have had seizures in the past due to cocaine use.  Patient drinks on occasion. In ER he ws given valium 5 mg and became somnolent. Unable to obtain history from him. Girlfriend at bedside.  denies slurred speech, no gait abnormality no weakness. Girlfriend states he have had occasional chest pain.  Hospitalist was called for admission for benzodiazapine withdrawal   Hospital Course:  #1 chest pain Patient presented with chest pain in the setting of cocaine abuse as self-medication with Xanax. Cardiac enzymes were cycled which were negative x3. Patient was admitted to a telemetry bed. Patient improved clinically. 2-D echo is pending however patient refused to get a 2-D echo and left AGAINST MEDICAL ADVICE. Patient had no further chest pain during the hospitalization. Hopefully patient will  followup with PCP.  #2 polysubstance abuse Social work was consulted to provide patient with information concerning polysubstance abuse. Patient was provided with resource list for outpatient and inpatient substance abuse programs and outpatient mental health and counseling programs. Patient stated that he will consider stopping drug use one day.  #3 benzodiazepine withdrawal  Patient had presented with tremors due to concerns of having a seizure. Patient had been noted to be self-medicating with Xanax. Patient was placed on the CIWA protocol. Patient improved clinically and was stable on day of discharge.  Patient left AGAINST MEDICAL ADVICE even before his discharge papers completed.   Procedures:  None  Consultations:  None  Discharge Exam: Filed Vitals:   01/19/14 0500  BP: 107/70  Pulse: 75  Temp: 97.9 F (36.6 C)  Resp: 16    General: NAD Cardiovascular: RRR Respiratory: CTAB  Discharge Instructions You were cared for by a hospitalist during your hospital stay. If you have any questions about your discharge medications or the care you received while you were in the hospital after you are discharged, you can call the unit and asked to speak with the hospitalist on call if the hospitalist that took care of you is not available. Once you are discharged, your primary care physician will handle any further medical issues. Please note that NO REFILLS for any discharge medications will be authorized once you are discharged, as it is imperative that you return to your primary care physician (or establish a relationship with a primary care physician if you do not have one) for your aftercare  needs so that they can reassess your need for medications and monitor your lab values.     Medication List         aspirin 325 MG tablet  Take 650 mg by mouth every 6 (six) hours as needed for moderate pain.     TUMS 500 MG chewable tablet  Generic drug:  calcium carbonate  Chew 4  tablets by mouth 2 (two) times daily as needed for indigestion or heartburn.       Allergies  Allergen Reactions  . Sumatriptan Shortness Of Breath, Nausea And Vomiting, Palpitations and Other (See Comments)    Increased BP also  . Acetaminophen     Unknown       The results of significant diagnostics from this hospitalization (including imaging, microbiology, ancillary and laboratory) are listed below for reference.    Significant Diagnostic Studies: Dg Chest Port 1 View  01/31/2014   CLINICAL DATA:  Left-sided chest pain.  EXAM: PORTABLE CHEST - 1 VIEW  COMPARISON:  10/23/2013  FINDINGS: The heart size and mediastinal contours are within normal limits. Both lungs are clear. The visualized skeletal structures are unremarkable.  IMPRESSION: Normal chest.   Electronically Signed   By: Geanie Cooley M.D.   On: 01/31/2014 19:11    Microbiology: No results found for this or any previous visit (from the past 240 hour(s)).   Labs: Basic Metabolic Panel: No results found for this basename: NA, K, CL, CO2, GLUCOSE, BUN, CREATININE, CALCIUM, MG, PHOS,  in the last 168 hours Liver Function Tests: No results found for this basename: AST, ALT, ALKPHOS, BILITOT, PROT, ALBUMIN,  in the last 168 hours No results found for this basename: LIPASE, AMYLASE,  in the last 168 hours No results found for this basename: AMMONIA,  in the last 168 hours CBC: No results found for this basename: WBC, NEUTROABS, HGB, HCT, MCV, PLT,  in the last 168 hours Cardiac Enzymes: No results found for this basename: CKTOTAL, CKMB, CKMBINDEX, TROPONINI,  in the last 168 hours BNP: BNP (last 3 results)  Recent Labs  10/23/13 0042  PROBNP <5.0   CBG: No results found for this basename: GLUCAP,  in the last 168 hours     Signed:  [facility] MD [facility] 02/19/2014, 10:15 AM

## 2014-06-23 ENCOUNTER — Emergency Department (HOSPITAL_COMMUNITY)
Admission: EM | Admit: 2014-06-23 | Discharge: 2014-06-23 | Disposition: A | Discharge status: Home / Self Care | Payer: Self-pay | Attending: Emergency Medicine | Admitting: Emergency Medicine

## 2014-06-23 ENCOUNTER — Encounter (HOSPITAL_COMMUNITY): Payer: Self-pay | Admitting: Emergency Medicine

## 2014-06-23 DIAGNOSIS — F1410 Cocaine abuse, uncomplicated: Secondary | ICD-10-CM | POA: Insufficient documentation

## 2014-06-23 DIAGNOSIS — Z79899 Other long term (current) drug therapy: Secondary | ICD-10-CM | POA: Insufficient documentation

## 2014-06-23 DIAGNOSIS — Z8719 Personal history of other diseases of the digestive system: Secondary | ICD-10-CM | POA: Insufficient documentation

## 2014-06-23 DIAGNOSIS — IMO0002 Reserved for concepts with insufficient information to code with codable children: Secondary | ICD-10-CM | POA: Diagnosis present

## 2014-06-23 DIAGNOSIS — Y9389 Activity, other specified: Secondary | ICD-10-CM | POA: Insufficient documentation

## 2014-06-23 DIAGNOSIS — T424X1A Poisoning by benzodiazepines, accidental (unintentional), initial encounter: Secondary | ICD-10-CM | POA: Insufficient documentation

## 2014-06-23 DIAGNOSIS — Y92410 Unspecified street and highway as the place of occurrence of the external cause: Secondary | ICD-10-CM | POA: Insufficient documentation

## 2014-06-23 DIAGNOSIS — Z720 Tobacco use: Secondary | ICD-10-CM | POA: Insufficient documentation

## 2014-06-23 DIAGNOSIS — T50901A Poisoning by unspecified drugs, medicaments and biological substances, accidental (unintentional), initial encounter: Secondary | ICD-10-CM

## 2014-06-23 DIAGNOSIS — Z8709 Personal history of other diseases of the respiratory system: Secondary | ICD-10-CM | POA: Insufficient documentation

## 2014-06-23 DIAGNOSIS — F1210 Cannabis abuse, uncomplicated: Secondary | ICD-10-CM | POA: Insufficient documentation

## 2014-06-23 DIAGNOSIS — Z791 Long term (current) use of non-steroidal anti-inflammatories (NSAID): Secondary | ICD-10-CM | POA: Insufficient documentation

## 2014-06-23 DIAGNOSIS — Z8659 Personal history of other mental and behavioral disorders: Secondary | ICD-10-CM | POA: Insufficient documentation

## 2014-06-23 LAB — ETHANOL: Alcohol, Ethyl (B): <11 mg/dL (ref 0–11)

## 2014-06-23 LAB — CBC
HCT: 38.7 % — ABNORMAL LOW (ref 39.0–52.0)
Hemoglobin: 13.3 g/dL (ref 13.0–17.0)
MCH: 33.2 pg (ref 26.0–34.0)
MCHC: 34.4 g/dL (ref 30.0–36.0)
MCV: 96.5 fL (ref 78.0–100.0)
PLATELETS: 150 10*3/uL (ref 150–400)
RBC: 4.01 MIL/uL — ABNORMAL LOW (ref 4.22–5.81)
RDW: 12.4 % (ref 11.5–15.5)
WBC: 7.6 10*3/uL (ref 4.0–10.5)

## 2014-06-23 LAB — SALICYLATE LEVEL

## 2014-06-23 LAB — COMPREHENSIVE METABOLIC PANEL
ALT: 13 U/L (ref 0–53)
AST: 16 U/L (ref 0–37)
Albumin: 4 g/dL (ref 3.5–5.2)
Alkaline Phosphatase: 63 U/L (ref 39–117)
Anion gap: 9 (ref 5–15)
BUN: 12 mg/dL (ref 6–23)
CALCIUM: 9.7 mg/dL (ref 8.4–10.5)
CO2: 26 mEq/L (ref 19–32)
Chloride: 107 mEq/L (ref 96–112)
Creatinine, Ser: 1.14 mg/dL (ref 0.50–1.35)
GFR calc Af Amer: >90 mL/min (ref >90)
GFR calc non Af Amer: 80 mL/min — ABNORMAL LOW (ref >90)
Glucose, Bld: 154 mg/dL — ABNORMAL HIGH (ref 70–99)
Potassium: 3.9 mEq/L (ref 3.7–5.3)
SODIUM: 142 meq/L (ref 137–147)
TOTAL PROTEIN: 7 g/dL (ref 6.0–8.3)
Total Bilirubin: 0.3 mg/dL (ref 0.3–1.2)

## 2014-06-23 LAB — ACETAMINOPHEN LEVEL

## 2014-06-23 LAB — RAPID URINE DRUG SCREEN, HOSP PERFORMED
Amphetamines: NOT DETECTED
Barbiturates: NOT DETECTED
Benzodiazepines: NOT DETECTED
Cocaine: POSITIVE — AB
Opiates: NOT DETECTED
Tetrahydrocannabinol: POSITIVE — AB

## 2014-06-23 NOTE — ED Notes (Signed)
Poison control called and reported patient could be d/c and reported to Dr Romeo AppleHarrison.

## 2014-06-23 NOTE — Discharge Instructions (Signed)
Nontoxic Ingestion Your exam shows your ingestion is not likely to cause serious medical problems. Further treatment is not needed at this time. If you have vomited since your ingestion, you should not drink or eat for at least 2 to 3 hours. Then start with small sips of clear liquids until your stomach settles. You should not drink alcohol or take illegal recreational drugs or other mind-altering substances as this may worsen your condition. Sometimes the effects of drugs and other substances can be delayed. SEEK IMMEDIATE MEDICAL CARE IF:  You develop confusion, sleepiness, agitation, or difficulty walking.  You develop breathing problems, a cough, difficulty swallowing, or excess mucus.  You develop a stomach ache, repeated vomiting, or severe diarrhea.  You develop weakness, fever, or dehydration. Document Released: 10/05/2004 Document Revised: 11/20/2011 Document Reviewed: 09/28/2008 [facility] Patient Information 2015 [employer] [place]. This information is not intended to replace advice given to you by your health care provider. Make sure you discuss any questions you have with your health care provider.   Please watch for any increased sleepiness or drowsiness. Return for any difficulty awakening or shortness of breath.

## 2014-06-23 NOTE — ED Notes (Signed)
[facility] Patient needs to be observed in case baggie breaks then we are to watch for respiratory depression.
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
## 2014-06-23 NOTE — Progress Notes (Signed)
[facility] Community Liaison Stacy, ° °Provided pt with a list of primary care resources, [facility] [employer] Card application, and information on [facility] for outpatient mental health resources.  °

## 2014-06-27 ENCOUNTER — Emergency Department (HOSPITAL_COMMUNITY): Payer: Self-pay

## 2014-06-27 ENCOUNTER — Emergency Department (HOSPITAL_COMMUNITY)
Admission: EM | Admit: 2014-06-27 | Discharge: 2014-06-27 | Discharge status: Against Medical Advice | Payer: Self-pay | Attending: Emergency Medicine | Admitting: Emergency Medicine

## 2014-06-27 ENCOUNTER — Encounter (HOSPITAL_COMMUNITY): Payer: Self-pay | Admitting: Emergency Medicine

## 2014-06-27 DIAGNOSIS — Z8659 Personal history of other mental and behavioral disorders: Secondary | ICD-10-CM | POA: Insufficient documentation

## 2014-06-27 DIAGNOSIS — R000 Tachycardia, unspecified: Secondary | ICD-10-CM | POA: Insufficient documentation

## 2014-06-27 DIAGNOSIS — S0191XA Laceration without foreign body of unspecified part of head, initial encounter: Secondary | ICD-10-CM | POA: Insufficient documentation

## 2014-06-27 DIAGNOSIS — Z8669 Personal history of other diseases of the nervous system and sense organs: Secondary | ICD-10-CM | POA: Insufficient documentation

## 2014-06-27 DIAGNOSIS — Z720 Tobacco use: Secondary | ICD-10-CM | POA: Insufficient documentation

## 2014-06-27 DIAGNOSIS — Z791 Long term (current) use of non-steroidal anti-inflammatories (NSAID): Secondary | ICD-10-CM | POA: Insufficient documentation

## 2014-06-27 DIAGNOSIS — Z79899 Other long term (current) drug therapy: Secondary | ICD-10-CM | POA: Insufficient documentation

## 2014-06-27 DIAGNOSIS — R51 Headache: Secondary | ICD-10-CM | POA: Insufficient documentation

## 2014-06-27 DIAGNOSIS — Z8709 Personal history of other diseases of the respiratory system: Secondary | ICD-10-CM | POA: Insufficient documentation

## 2014-06-27 DIAGNOSIS — Z8719 Personal history of other diseases of the digestive system: Secondary | ICD-10-CM | POA: Insufficient documentation

## 2014-06-27 DIAGNOSIS — IMO0002 Reserved for concepts with insufficient information to code with codable children: Secondary | ICD-10-CM

## 2014-06-27 DIAGNOSIS — F1410 Cocaine abuse, uncomplicated: Secondary | ICD-10-CM | POA: Insufficient documentation

## 2014-06-27 LAB — BASIC METABOLIC PANEL
Anion gap: 17 — ABNORMAL HIGH (ref 5–15)
BUN: 9 mg/dL (ref 6–23)
CALCIUM: 10.5 mg/dL (ref 8.4–10.5)
CO2: 23 mEq/L (ref 19–32)
CREATININE: 1 mg/dL (ref 0.50–1.35)
Chloride: 99 mEq/L (ref 96–112)
GFR calc Af Amer: >90 mL/min (ref >90)
GFR calc non Af Amer: >90 mL/min (ref >90)
GLUCOSE: 115 mg/dL — AB (ref 70–99)
Potassium: 3.7 mEq/L (ref 3.7–5.3)
Sodium: 139 mEq/L (ref 137–147)

## 2014-06-27 LAB — RAPID URINE DRUG SCREEN, HOSP PERFORMED
Amphetamines: NOT DETECTED
Barbiturates: NOT DETECTED
Benzodiazepines: NOT DETECTED
COCAINE: POSITIVE — AB
OPIATES: NOT DETECTED
Tetrahydrocannabinol: NOT DETECTED

## 2014-06-27 LAB — CBC
HEMATOCRIT: 46.3 % (ref 39.0–52.0)
HEMOGLOBIN: 16.5 g/dL (ref 13.0–17.0)
MCH: 33.8 pg (ref 26.0–34.0)
MCHC: 35.6 g/dL (ref 30.0–36.0)
MCV: 94.9 fL (ref 78.0–100.0)
Platelets: 162 10*3/uL (ref 150–400)
RBC: 4.88 MIL/uL (ref 4.22–5.81)
RDW: 12.3 % (ref 11.5–15.5)
WBC: 9.3 10*3/uL (ref 4.0–10.5)

## 2014-06-27 LAB — ETHANOL: Alcohol, Ethyl (B): 84 mg/dL — ABNORMAL HIGH (ref 0–11)

## 2014-06-27 LAB — I-STAT TROPONIN, ED: Troponin i, poc: 0 ng/mL (ref 0.00–0.08)

## 2014-06-27 MED ORDER — SODIUM CHLORIDE 0.9 % IV BOLUS (SEPSIS)
1000.0000 mL | Freq: Once | INTRAVENOUS | Status: AC
  Administered 2014-06-27: 1000 mL via INTRAVENOUS

## 2014-06-27 NOTE — ED Notes (Signed)
He remains in no distress.  [employer] detectives have been visiting with him. He is taken to CT at this time.
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
## 2014-06-27 NOTE — Discharge Instructions (Signed)
Please call your doctor for a followup appointment within 24-48 hours. When you talk to your doctor please let them know that you were seen in the emergency department and have them acquire all of your records so that they can discuss the findings with you and formulate a treatment plan to fully care for your new and ongoing problems. Please rest and stay hydrated Please keep wound covered and dry - if gauze is to get wet please change dressings Please wash with warm water and soap and apply antibiotics ointment  Please continue to monitor symptoms closely and if symptoms are to worsen or change (fever greater than 101, chills, sweating, nausea, vomiting, chest pain, shortness of breathe, difficulty breathing, weakness, numbness, tingling, worsening or changes to pain pattern, swelling to the face, inability to open and close the jaw line, pus drainage, neck pain, neck stiffness) please report back to the Emergency Department immediately.   Laceration Care, Adult A laceration is a cut or lesion that goes through all layers of the skin and into the tissue just beneath the skin. TREATMENT  Some lacerations may not require closure. Some lacerations may not be able to be closed due to an increased risk of infection. It is important to see your caregiver as soon as possible after an injury to minimize the risk of infection and maximize the opportunity for successful closure. If closure is appropriate, pain medicines may be given, if needed. The wound will be cleaned to help prevent infection. Your caregiver will use stitches (sutures), staples, wound glue (adhesive), or skin adhesive strips to repair the laceration. These tools bring the skin edges together to allow for faster healing and a better cosmetic outcome. However, all wounds will heal with a scar. Once the wound has healed, scarring can be minimized by covering the wound with sunscreen during the day for 1 full year. HOME CARE INSTRUCTIONS  For  sutures or staples:  Keep the wound clean and dry.  If you were given a bandage (dressing), you should change it at least once a day. Also, change the dressing if it becomes wet or dirty, or as directed by your caregiver.  Wash the wound with soap and water 2 times a day. Rinse the wound off with water to remove all soap. Pat the wound dry with a clean towel.  After cleaning, apply a thin layer of the antibiotic ointment as recommended by your caregiver. This will help prevent infection and keep the dressing from sticking.  You may shower as usual after the first 24 hours. Do not soak the wound in water until the sutures are removed.  Only take over-the-counter or prescription medicines for pain, discomfort, or fever as directed by your caregiver.  Get your sutures or staples removed as directed by your caregiver. For skin adhesive strips:  Keep the wound clean and dry.  Do not get the skin adhesive strips wet. You may bathe carefully, using caution to keep the wound dry.  If the wound gets wet, pat it dry with a clean towel.  Skin adhesive strips will fall off on their own. You may trim the strips as the wound heals. Do not remove skin adhesive strips that are still stuck to the wound. They will fall off in time. For wound adhesive:  You may briefly wet your wound in the shower or bath. Do not soak or scrub the wound. Do not swim. Avoid periods of heavy perspiration until the skin adhesive has fallen off on its  own. After showering or bathing, gently pat the wound dry with a clean towel.  Do not apply liquid medicine, cream medicine, or ointment medicine to your wound while the skin adhesive is in place. This may loosen the film before your wound is healed.  If a dressing is placed over the wound, be careful not to apply tape directly over the skin adhesive. This may cause the adhesive to be pulled off before the wound is healed.  Avoid prolonged exposure to sunlight or tanning lamps  while the skin adhesive is in place. Exposure to ultraviolet light in the first year will darken the scar.  The skin adhesive will usually remain in place for 5 to 10 days, then naturally fall off the skin. Do not pick at the adhesive film. You may need a tetanus shot if:  You cannot remember when you had your last tetanus shot.  You have never had a tetanus shot. If you get a tetanus shot, your arm may swell, get red, and feel warm to the touch. This is common and not a problem. If you need a tetanus shot and you choose not to have one, there is a rare chance of getting tetanus. Sickness from tetanus can be serious. SEEK MEDICAL CARE IF:   You have redness, swelling, or increasing pain in the wound.  You see a red line that goes away from the wound.  You have yellowish-white fluid (pus) coming from the wound.  You have a fever.  You notice a bad smell coming from the wound or dressing.  Your wound breaks open before or after sutures have been removed.  You notice something coming out of the wound such as wood or glass.  Your wound is on your hand or foot and you cannot move a finger or toe. SEEK IMMEDIATE MEDICAL CARE IF:   Your pain is not controlled with prescribed medicine.  You have severe swelling around the wound causing pain and numbness or a change in color in your arm, hand, leg, or foot.  Your wound splits open and starts bleeding.  You have worsening numbness, weakness, or loss of function of any joint around or beyond the wound.  You develop painful lumps near the wound or on the skin anywhere on your body. MAKE SURE YOU:   Understand these instructions.  Will watch your condition.  Will get help right away if you are not doing well or get worse. Document Released: 08/28/2005 Document Revised: 11/20/2011 Document Reviewed: 02/21/2011 [facility] Patient Information 2015 [place], [place]. This information is not intended to replace advice given to you by your  health care provider. Make sure you discuss any questions you have with your health care provider.  Drug Abuse and Addiction in Sports There are many types of drugs that one may become addicted to including illegal drugs (marijuana, cocaine, amphetamines, hallucinogens, and narcotics), prescription drugs (hydrocodone, codeine, and alprazolam), and other chemicals such as alcohol or nicotine. Two types of addiction exist: physical and emotional. Physical addiction usually occurs after prolonged use of a drug. However, some drugs may only take a couple uses before addiction can occur. Physical addiction is marked by withdrawal symptoms in which the person experiences negative symptoms such as sweat, anxiety, tremors, hallucinations, or cravings in the absence of using the drug. Emotional dependence is the psychological desire for the "high" that the drugs produce when taken. SYMPTOMS   Inattentiveness.  Negligence.  Forgetfulness.  Insomnia.  Mood swings. RISK INCREASES WITH:  Family history of addiction.  Personal history of addictive personality. Studies have shown that risk takers, which many athletes are, have a higher risk of addiction. PREVENTION The only adequate prevention of drug abuse is abstinence from drugs. TREATMENT  The first step in quitting substance abuse is recognizing the problem and realizing that one has the power to change. Quitting requires a plan and support from others. It is often necessary to seek medical assistance. Caregivers are available to offer counseling, and for certain cases, medicine to diminish the physical symptoms of withdrawal. Many organizations exist such as Alcoholics Anonymous, Narcotics Anonymous, or the [employer] on Alcoholism that offer support for individuals who have chosen to quit their habits. Document Released: 08/28/2005 Document Revised: 01/12/2014 Document Reviewed: 12/10/2008 [facility] Patient Information 2015 [place], [place].  This information is not intended to replace advice given to you by your health care provider. Make sure you discuss any questions you have with your health care provider.   Emergency Department Resource Guide 1) Find a Doctor and Pay Out of Pocket Although you won't have to find out who is covered by your insurance plan, it is a good idea to ask around and get recommendations. You will then need to call the office and see if the doctor you have chosen will accept you as a new patient and what types of options they offer for patients who are self-pay. Some doctors offer discounts or will set up payment plans for their patients who do not have insurance, but you will need to ask so you aren't surprised when you get to your appointment.  2) Contact Your Local Health Department Not all health departments have doctors that can see patients for sick visits, but many do, so it is worth a call to see if yours does. If you don't know where your local health department is, you can check in your phone book. The CDC also has a tool to help you locate your state's health department, and many state websites also have listings of all of their local health departments.  3) Find a Walk-in Clinic If your illness is not likely to be very severe or complicated, you may want to try a walk in clinic. These are popping up all over the country in pharmacies, drugstores, and shopping centers. They're usually staffed by nurse practitioners or physician assistants that have been trained to treat common illnesses and complaints. They're usually fairly quick and inexpensive. However, if you have serious medical issues or chronic medical problems, these are probably not your best option.  No Primary Care Doctor: - Call Health Connect at  [phone] - they can help you locate a primary care doctor that  accepts your insurance, provides certain services, etc. - Physician Referral Service- [phone]  Chronic Pain  Problems: Organization         Address  Phone   Notes  [facility]  [phone] Patients need to be referred by their primary care doctor.   Medication Assistance: Organization         Address  Phone   Notes  [facility] [address] [place] [place], [place] [address] [phone] --Must be a resident of [facility] -- Must have NO insurance coverage whatsoever (no Medicaid/ Medicare, etc.) -- The pt. MUST have a primary care doctor that directs their care regularly and follows them in the community   [facility]  [phone]   [address]  [phone]  Agencies that provide inexpensive medical care: Organization         Address  Phone   Notes  [facility]  [phone]   [facility]    [phone]   [facility] [address] [place], [place] [address] [phone]   [facility] [address] [place] [place] [phone]   Planned Parenthood    [phone]   [facility]    [phone]   [facility]  [address], [place] Phone:  [phone], Fax:  [phone] Hours of Operation:  9 am - 6 pm, M-F.  Also accepts Medicaid/Medicare and self-pay.  [facility]  [address], [place] Phone: [phone], Fax: [phone]. Hours of Operation:  8:30 am - 5:30 pm, M-F.  Also accepts Medicaid and self-pay.  [facility] [place] [address], [place] Phone: [phone]   [facility] [address] Natasha Bence [place], [place] [phone], Ext. [phone] Mondays & Thursdays: 7-9 AM.  First 15 patients are seen on a first come, first serve basis.    Medicaid-accepting [facility] Providers:  Organization         Address  Phone   Notes  [facility] [address], [place] [phone] Also  accepts self-pay patients.  [facility] [address] Laurell Josephs [place], [place]  [phone]   [facility] [address], [place] [phone]   [facility] [address], [place] [phone]   Renaye Rakers [address], [place]   [phone] Only accepts [place] Access [place] patients after they have their name applied to their card.   Self-Pay (no insurance) in [facility]:  Organization         Address  Phone   Notes  Sickle Cell Patients, [facility] [address] [place], [place] [phone]   [facility] [address] [place], [place] [phone]   [facility] [place]  [address], [place] [phone]   [facility]/Dr. Osei-Bonsu  [address] [place] or [address], [place] [phone] Phone number for both [place] and [place] locations is the same.  [facility] [address], [place] [phone]   [facility] [address] [place] or [address] [phone]   [facility] [address] [place] [address], [place] [phone], phone; [phone], fax Sees patients 1st and 3rd Saturday of every month.  Must not qualify for public or private insurance (i.e. Medicaid, Medicare, [place] Health Choice, Veterans' Benefits)  Household income should be no more than 200% of the poverty level The clinic cannot treat you if you are pregnant or think you are pregnant  Sexually transmitted diseases are not treated at the clinic.    Dental Care: Organization         Address  Phone  Notes  [facility] Department of [facility] [address] [place], [place] [phone] Accepts children up to age 72 who are enrolled in [place] or [place] Health Choice; pregnant  women with a Medicaid card; and children who have applied for Medicaid or  [place] Health Choice, but were declined, whose parents can pay a reduced fee at time of service.  [facility] [place]  [address], [place] [phone]) [phone] Accepts children up to age 38 who are enrolled in [place] or [place] Health Choice; pregnant women with a Medicaid card; and children who have applied for Medicaid or [place] Health Choice, but were declined, whose parents can pay a reduced fee at time of service.  [facility]  [address], [place] [phone]) [phone] Patients are seen by appointment only. Walk-ins are not accepted. [facility] will see patients 38 years of age and older. Monday - Tuesday (8am-5pm) Most Wednesdays (8:30-5pm) $30 per visit, cash only  [facility]  [address], [facility] [place] [phone]) [phone] Patients are seen by appointment only. Walk-ins are not accepted. [facility] will see patients 38 years of age and older. One Wednesday Evening (Monthly: Volunteer Based).  $30 per visit, cash only  [facility]  [phone]) [phone] for adults; Children under age 94, call Graduate Pediatric Dentistry at [phone]. Children aged 724-14, please call [phone] to request a pediatric application.  Dental services are provided in all areas of dental care including fillings, crowns and bridges, complete and partial dentures, implants, gum treatment, root canals, and extractions. Preventive care is also provided. Treatment is provided to both adults and children. Patients are selected via a lottery and there is often a waiting list.   [facility] [address], [place]  [phone]) [phone] [email or web address]   [facility] [address], [place], [place] [phone], Ext. [phone] Second and Fourth Thursday of each month, opens at 6:30 AM; Clinic ends at 9 AM.  Patients are  seen on a first-come first-served basis, and a limited number are seen during each clinic.   [facility]  [address], [place], [place] [phone]) [phone]   Eligibility Requirements You must have lived in [place], [place], or [place] counties for at least the last three months.   You cannot be eligible for state or federal sponsored [place]healthcare insurance, including CIGNAVeterans Administration, [place]icaid, or [employer]icare.   You generally cannot be eligible for healthcare insurance through your employer.    How to apply: Eligibility screenings are held every Tuesday and Wednesday afternoon from 1:00 pm until 4:00 pm. You do not need an appointment for the interview!  [facility] [address], [place], [place] [phone]   [facility]  [phone]   [facility]  [phone]   [facility]  [phone]    Behavioral Health Resources in the Community: Intensive Outpatient Programs Organization         Address  Phone  Notes  [facility] [address], [place], [place] [phone]   [facility] [address], [place], [place] [phone]   [facility] [address], [place], [place]  [phone]   [facility] [address],  [place], [place] [phone] or [phone]   Substance Abuse Resources Organization         Address  Phone  Notes  [facility]  [phone]   [facility]  [phone]   [facility]  [phone]   [facility]  [phone]   Residential & Outpatient Substance Abuse Program  [phone]  Psychological Services Organization         Address  Phone  Notes  [facility]  [phone]   [facility] Services  [phone]   [facility] Mental Health [phone] [address], [place] [phone] or [phone]    Mobile Crisis  Teams Organization         Address  Phone  Notes  [facility], Mobile Crisis Care Unit  [phone]   [facility]  [address] [place], [place] [phone]   Doristine Locks [address] [place] [phone]    Self-Help/Support Groups Organization         Address  Phone             Notes  [facility] [place] - variety of support groups  [phone]- [phone] Call for more information  Narcotics Anonymous (NA), [facility] [address], [employer] [place]  2 meetings at this location   Statistician         Address  Phone  Notes  [facility] [address],    [place]  [phone]   [facility]  [address], [place] [address], [place], [place] [phone]   [facility] [address] [place], [place] [phone] Admissions: 8am-3pm M-F  [facility] [address],    [place], [place] [phone]   [facility] [address] [place], [place], [place] [phone]   [facility] [address]  [place], [place] [phone]   [facility] - Intensive Outpatient [address] Laurell Josephs [address], [place], [place] [phone]   [facility] ([facility] [address] [place]  [place], [place] [phone] or [phone]   [facility] ([facility]) [address], [place], [place] [phone] Accepts Medicaid  [facility] [address],  [place] [phone] Substance Abuse/Addiction Treatment   [facility] Organization         Address  Phone  Notes  [facility]  [phone]   Angie Fava, PhD [address] Ervin Knack [place], [place]   [phone] or [phone]   [facility] Behavioral   [address] [place], [place] [phone]   [facility] [address], [place], [place] [phone]  Insurance/Medicaid/sponsorship through [facility] and Families [address]                                    [place], [place] [phone] Therapy/tele-psych/case  [facility] [address], [place] [phone]    Dr. Lolly Mustache  [phone]   [facility]  United [employer] [facility] Dept. 1) [address], [place] 2) [address] [place] 3)  [address], [place] [phone]  [phone]   [facility] Child Abuse Hotline [phone] or 5740551196 (After Hours)
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
## 2014-06-27 NOTE — ED Notes (Addendum)
Pt denies being able to void at this time. With [facility], will get ekg when they are finished
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
## 2014-06-27 NOTE — ED Notes (Signed)
Pt reports "ready to go", reports not wanting to stay for further treatment.  EDPA made aware by Jorja Loaim, RN.

## 2014-06-28 NOTE — ED Provider Notes (Signed)
Medical screening examination/treatment/procedure(s) were performed by non-physician practitioner and as supervising physician I was immediately available for consultation/collaboration.   EKG Interpretation   Date/Time:  Saturday June 27 2014 10:15:55 EDT Ventricular Rate:  120 PR Interval:  174 QRS Duration: 87 QT Interval:  322 QTC Calculation: 455 R Axis:   -147 Text Interpretation:  Sinus tachycardia Left atrial enlargement  Anteroseptal infarct, age indeterminate ST elevation, early repolarization  Lateral leads are also involved No significant change since last tracing  Confirmed by Anitra LauthPLUNKETT  MD, Reyana Leisey ([id number]) on 06/27/2014 10:25:32 AM        Gwyneth SproutWhitney Zain Lankford, MD 06/28/14 [phone]

## 2014-06-29 ENCOUNTER — Emergency Department (HOSPITAL_COMMUNITY)
Admission: EM | Admit: 2014-06-29 | Discharge: 2014-06-29 | Discharge status: Against Medical Advice | Payer: Self-pay | Attending: Emergency Medicine | Admitting: Emergency Medicine

## 2014-06-29 ENCOUNTER — Encounter (HOSPITAL_COMMUNITY): Payer: Self-pay | Admitting: Emergency Medicine

## 2014-06-29 DIAGNOSIS — S0990XA Unspecified injury of head, initial encounter: Secondary | ICD-10-CM | POA: Insufficient documentation

## 2014-06-29 DIAGNOSIS — Z720 Tobacco use: Secondary | ICD-10-CM | POA: Insufficient documentation

## 2014-06-29 NOTE — ED Notes (Signed)
Called pt for third time, no response

## 2014-06-29 NOTE — ED Notes (Signed)
Called pt for second time, no response
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
## 2014-06-29 NOTE — ED Notes (Signed)
rn called for pt in lobby several times, called for pt outside and in bathroom as well, no reply.

## 2014-08-20 ENCOUNTER — Encounter (HOSPITAL_COMMUNITY): Payer: Self-pay | Admitting: *Deleted

## 2014-08-20 ENCOUNTER — Emergency Department (HOSPITAL_COMMUNITY)
Admission: EM | Admit: 2014-08-20 | Discharge: 2014-08-20 | Disposition: A | Discharge status: Home / Self Care | Payer: Self-pay | Attending: Emergency Medicine | Admitting: Emergency Medicine

## 2014-08-20 DIAGNOSIS — R000 Tachycardia, unspecified: Secondary | ICD-10-CM | POA: Insufficient documentation

## 2014-08-20 DIAGNOSIS — K219 Gastro-esophageal reflux disease without esophagitis: Secondary | ICD-10-CM | POA: Insufficient documentation

## 2014-08-20 DIAGNOSIS — Z720 Tobacco use: Secondary | ICD-10-CM | POA: Insufficient documentation

## 2014-08-20 DIAGNOSIS — G40909 Epilepsy, unspecified, not intractable, without status epilepticus: Secondary | ICD-10-CM | POA: Insufficient documentation

## 2014-08-20 DIAGNOSIS — Z791 Long term (current) use of non-steroidal anti-inflammatories (NSAID): Secondary | ICD-10-CM | POA: Insufficient documentation

## 2014-08-20 DIAGNOSIS — F419 Anxiety disorder, unspecified: Secondary | ICD-10-CM | POA: Insufficient documentation

## 2014-08-20 DIAGNOSIS — Z8709 Personal history of other diseases of the respiratory system: Secondary | ICD-10-CM | POA: Insufficient documentation

## 2014-08-20 DIAGNOSIS — K920 Hematemesis: Secondary | ICD-10-CM | POA: Insufficient documentation

## 2014-08-20 DIAGNOSIS — Z79899 Other long term (current) drug therapy: Secondary | ICD-10-CM | POA: Insufficient documentation

## 2014-08-20 LAB — CBC
HCT: 43.9 % (ref 39.0–52.0)
Hemoglobin: 15.2 g/dL (ref 13.0–17.0)
MCH: 33.3 pg (ref 26.0–34.0)
MCHC: 34.6 g/dL (ref 30.0–36.0)
MCV: 96.1 fL (ref 78.0–100.0)
Platelets: 177 10*3/uL (ref 150–400)
RBC: 4.57 MIL/uL (ref 4.22–5.81)
RDW: 11.9 % (ref 11.5–15.5)
WBC: 9.1 10*3/uL (ref 4.0–10.5)

## 2014-08-20 LAB — COMPREHENSIVE METABOLIC PANEL
ALT: 20 U/L (ref 0–53)
AST: 21 U/L (ref 0–37)
Albumin: 4.5 g/dL (ref 3.5–5.2)
Alkaline Phosphatase: 75 U/L (ref 39–117)
Anion gap: 13 (ref 5–15)
BUN: 14 mg/dL (ref 6–23)
CO2: 26 meq/L (ref 19–32)
CREATININE: 1.33 mg/dL (ref 0.50–1.35)
Calcium: 10.3 mg/dL (ref 8.4–10.5)
Chloride: 100 mEq/L (ref 96–112)
GFR calc Af Amer: 77 mL/min — ABNORMAL LOW (ref >90)
GFR, EST NON AFRICAN AMERICAN: 66 mL/min — AB (ref >90)
Glucose, Bld: 105 mg/dL — ABNORMAL HIGH (ref 70–99)
Potassium: 4 mEq/L (ref 3.7–5.3)
SODIUM: 139 meq/L (ref 137–147)
Total Bilirubin: 0.3 mg/dL (ref 0.3–1.2)
Total Protein: 8.2 g/dL (ref 6.0–8.3)

## 2014-08-20 LAB — LIPASE, BLOOD: Lipase: 40 U/L (ref 11–59)

## 2014-08-20 MED ORDER — PANTOPRAZOLE SODIUM 20 MG PO TBEC: 20.0000 mg | DELAYED_RELEASE_TABLET | Freq: Every day | ORAL | Status: DC

## 2014-08-20 MED ORDER — SODIUM CHLORIDE 0.9 % IV BOLUS (SEPSIS)
1000.0000 mL | Freq: Once | INTRAVENOUS | Status: AC
Start: 2014-08-20 — End: 2014-08-20
  Administered 2014-08-20: 1000 mL via INTRAVENOUS

## 2014-08-20 MED ORDER — PANTOPRAZOLE SODIUM 40 MG IV SOLR
40.0000 mg | Freq: Once | INTRAVENOUS | Status: AC
  Administered 2014-08-20: 40 mg via INTRAVENOUS
  Filled 2014-08-20: qty 40

## 2014-08-20 MED ORDER — PROMETHAZINE HCL 25 MG PO TABS: 25.0000 mg | ORAL_TABLET | Freq: Four times a day (QID) | ORAL | Status: DC | PRN

## 2014-08-20 MED ORDER — PROMETHAZINE HCL 25 MG/ML IJ SOLN
12.5000 mg | Freq: Once | INTRAMUSCULAR | Status: AC
  Administered 2014-08-20: 12.5 mg via INTRAVENOUS
  Filled 2014-08-20: qty 1

## 2014-08-20 MED ORDER — ONDANSETRON 8 MG PO TBDP
8.0000 mg | ORAL_TABLET | Freq: Once | ORAL | Status: AC
  Administered 2014-08-20: 8 mg via ORAL
  Filled 2014-08-20: qty 1

## 2014-08-20 NOTE — Discharge Instructions (Signed)
Hematemesis °This condition is the vomiting of blood. °CAUSES  °This can happen if you have a peptic ulcer or an irritation of the throat, stomach, or small bowel. Vomiting over and over again or swallowing blood from a nosebleed, coughing or facial injury can also result in bloody vomit. Anti-inflammatory pain medicines are a common cause of this potentially dangerous condition. The most serious causes of vomiting blood include: °· Ulcers (a bacteria called H. pylori is common cause of ulcers). °· Clotting problems. °· Alcoholism. °· Cirrhosis. °TREATMENT  °Treatment depends on the cause and the severity of the bleeding. Small amounts of blood streaks in the vomit is not the same as vomiting large amounts of bloody or dark, coffee grounds-like material. Weakness, fainting, dehydration, anemia, and continued alcohol or drug use increase the risk. Examination may include blood, vomit, or stool tests. The presence of bloody or dark stool that tests positive for blood (Hemoccult) means the bleeding has been going on for some time. Endoscopy and imaging studies may be done. Emergency treatment may include: °· IV medicines or fluids. °· Blood transfusions. °· Surgery. °Hospital care is required for high risk patients or when IV fluids or blood is needed. Upper GI bleeding can cause shock and death if not controlled. °HOME CARE INSTRUCTIONS  °· Your treatment does not require hospital care at this time. °· Remain at rest until your condition improves. °· Drink clear liquids as tolerated. °· Avoid: °¨ Alcohol. °¨ Nicotine. °¨ Aspirin. °¨ Any other anti-inflammatory medicine (ibuprofen, naproxen, and many others). °· Medications to suppress stomach acid or vomiting may be needed. Take all your medicine as prescribed. °· Be sure to see your caregiver for follow-up as recommended. °SEEK IMMEDIATE MEDICAL CARE IF:  °· You have repeated vomiting, dehydration, fainting, or extreme weakness. °· You are vomiting large amounts of  bloody or dark material. °· You pass large, dark or bloody stools. °Document Released: 10/05/2004 Document Revised: 11/20/2011 Document Reviewed: 10/21/2008 °ExitCare® Patient Information ©2015 [employer]. This information is not intended to replace advice given to you by your health care provider. Make sure you discuss any questions you have with your health care provider. ° °

## 2014-08-20 NOTE — ED Provider Notes (Signed)
CSN: [id number]     Arrival date & time 08/20/14  0056 History   First MD Initiated Contact with Patient 08/20/14 0309     Chief Complaint  Patient presents with  . Hematemesis     (Consider location/radiation/quality/duration/timing/severity/associated sxs/prior Treatment) Patient is a 38 y.o. male presenting with vomiting. The history is provided by the patient. No language interpreter was used.  Emesis Severity:  Moderate Duration:  3 hours Number of daily episodes:  3 Quality:  Bright red blood Associated symptoms: abdominal pain   Associated symptoms: no chills   Associated symptoms comment:  He states he drank heavily 2 days ago. Tonight he experienced multiple episodes of bloody emesis, last vomiting was an hour ago after being given Zofran in the emergency department. No fever. No history of similar symptoms. He reports he drinks heavily approximately 2-3 times per week.    Past Medical History  Diagnosis Date  . Cocaine abuse   . Depression   . Seizures   . Polysubstance abuse   . Sinus infection   . Anxiety   . GERD (gastroesophageal reflux disease)    Past Surgical History  Procedure Laterality Date  . Femur surgery     No family history on file. History  Substance Use Topics  . Smoking status: Current Every Day Smoker -- 0.50 packs/day    Types: Cigarettes  . Smokeless tobacco: Not on file  . Alcohol Use: Yes     Comment: does not drink on daily bases but has binges    Review of Systems  Constitutional: Negative for fever and chills.  HENT: Negative.   Respiratory: Negative.   Cardiovascular: Negative.   Gastrointestinal: Positive for nausea, vomiting and abdominal pain. Negative for blood in stool.  Genitourinary: Negative.   Musculoskeletal: Negative.   Skin: Negative.   Neurological: Negative.       Allergies  Sumatriptan and Acetaminophen  Home Medications   Prior to Admission medications   Medication Sig Start Date End Date Taking?  Authorizing Provider  calcium carbonate (TUMS - DOSED IN MG ELEMENTAL CALCIUM) 500 MG chewable tablet Chew 1 tablet by mouth daily.   Yes Historical Provider, MD  clonazePAM (KLONOPIN) 1 MG tablet Take 1 mg by mouth daily.   Yes Historical Provider, MD  ibuprofen (ADVIL,MOTRIN) 200 MG tablet Take 400 mg by mouth every 6 (six) hours as needed for mild pain.   Yes Historical Provider, MD  naproxen sodium (ANAPROX) 220 MG tablet Take 220 mg by mouth 2 (two) times daily with a meal.   Yes Historical Provider, MD  oxymetazoline (AFRIN) 0.05 % nasal spray Place 2 sprays into both nostrils 2 (two) times daily.    Yes Historical Provider, MD   BP 148/101 mmHg  Pulse 107  Temp(Src) 97.5 F (36.4 C) (Oral)  Resp 21  SpO2 100% Physical Exam  Constitutional: He is oriented to person, place, and time. He appears well-developed and well-nourished.  HENT:  Head: Normocephalic.  Neck: Normal range of motion. Neck supple.  Cardiovascular: Regular rhythm.  Tachycardia present.   Pulmonary/Chest: Effort normal and breath sounds normal.  Abdominal: Soft. Bowel sounds are normal. There is no tenderness. There is no rebound and no guarding.  Musculoskeletal: Normal range of motion.  Neurological: He is alert and oriented to person, place, and time.  Skin: Skin is warm and dry. No rash noted.  Psychiatric: He has a normal mood and affect.    ED Course  Procedures (including critical care time) Labs  Review Labs Reviewed  COMPREHENSIVE METABOLIC PANEL - Abnormal; Notable for the following:    Glucose, Bld 105 (*)    GFR calc non Af Amer 66 (*)    GFR calc Af Amer 77 (*)    All other components within normal limits  CBC  LIPASE, BLOOD    Imaging Review No results found.   EKG Interpretation None      MDM   Final diagnoses:  None    1. Hematemesis  No further vomiting in ED. Suspect mallory weiss tear following multiple episodes of vomiting. Significant alcohol use must be considered as  well. Hgb stable. VS improved. Will refer to GI. Return precautions discussed.     Arnoldo HookerShari A Lyanne Kates, PA-C 08/20/14 [id number]  Hanley SeamenJohn L Molpus, MD 08/20/14 [phone]

## 2014-08-20 NOTE — ED Notes (Signed)
Pt actively vomiting. Emesis looks to possibly contain blood clots. Sample collected.

## 2014-08-20 NOTE — ED Notes (Signed)
Pt reports hematemesis x 2 tonight.  Reports drinking a lot of alcohol last night.  Pt states the first time he vomited was just the food he ate.  Pt denies abd pain.  Pt states when he woke up he felt warm but did not have a hangover from last night.

## 2014-10-29 ENCOUNTER — Emergency Department (HOSPITAL_COMMUNITY): Payer: Self-pay

## 2014-10-29 ENCOUNTER — Emergency Department (HOSPITAL_COMMUNITY)
Admission: EM | Admit: 2014-10-29 | Discharge: 2014-10-29 | Disposition: A | Discharge status: Home / Self Care | Payer: Self-pay | Attending: Emergency Medicine | Admitting: Emergency Medicine

## 2014-10-29 ENCOUNTER — Encounter (HOSPITAL_COMMUNITY): Payer: Self-pay | Admitting: *Deleted

## 2014-10-29 DIAGNOSIS — F14280 Cocaine dependence with cocaine-induced anxiety disorder: Secondary | ICD-10-CM | POA: Insufficient documentation

## 2014-10-29 DIAGNOSIS — Z720 Tobacco use: Secondary | ICD-10-CM | POA: Insufficient documentation

## 2014-10-29 DIAGNOSIS — Z79899 Other long term (current) drug therapy: Secondary | ICD-10-CM | POA: Insufficient documentation

## 2014-10-29 DIAGNOSIS — R519 Headache, unspecified: Secondary | ICD-10-CM

## 2014-10-29 DIAGNOSIS — Z791 Long term (current) use of non-steroidal anti-inflammatories (NSAID): Secondary | ICD-10-CM | POA: Insufficient documentation

## 2014-10-29 DIAGNOSIS — F1910 Other psychoactive substance abuse, uncomplicated: Secondary | ICD-10-CM

## 2014-10-29 DIAGNOSIS — I10 Essential (primary) hypertension: Secondary | ICD-10-CM | POA: Insufficient documentation

## 2014-10-29 DIAGNOSIS — F329 Major depressive disorder, single episode, unspecified: Secondary | ICD-10-CM | POA: Insufficient documentation

## 2014-10-29 DIAGNOSIS — R51 Headache: Secondary | ICD-10-CM | POA: Insufficient documentation

## 2014-10-29 DIAGNOSIS — G40909 Epilepsy, unspecified, not intractable, without status epilepticus: Secondary | ICD-10-CM | POA: Insufficient documentation

## 2014-10-29 DIAGNOSIS — K219 Gastro-esophageal reflux disease without esophagitis: Secondary | ICD-10-CM | POA: Insufficient documentation

## 2014-10-29 DIAGNOSIS — R0789 Other chest pain: Secondary | ICD-10-CM | POA: Insufficient documentation

## 2014-10-29 LAB — BASIC METABOLIC PANEL
ANION GAP: 7 (ref 5–15)
BUN: 17 mg/dL (ref 6–23)
CALCIUM: 10.5 mg/dL (ref 8.4–10.5)
CO2: 26 mmol/L (ref 19–32)
Chloride: 106 mmol/L (ref 96–112)
Creatinine, Ser: 1.11 mg/dL (ref 0.50–1.35)
GFR, EST NON AFRICAN AMERICAN: 83 mL/min — AB (ref >90)
Glucose, Bld: 107 mg/dL — ABNORMAL HIGH (ref 70–99)
POTASSIUM: 4 mmol/L (ref 3.5–5.1)
SODIUM: 139 mmol/L (ref 135–145)

## 2014-10-29 LAB — CBC
HCT: 42.6 % (ref 39.0–52.0)
Hemoglobin: 15.1 g/dL (ref 13.0–17.0)
MCH: 33.8 pg (ref 26.0–34.0)
MCHC: 35.4 g/dL (ref 30.0–36.0)
MCV: 95.3 fL (ref 78.0–100.0)
PLATELETS: 166 10*3/uL (ref 150–400)
RBC: 4.47 MIL/uL (ref 4.22–5.81)
RDW: 12.2 % (ref 11.5–15.5)
WBC: 7.4 10*3/uL (ref 4.0–10.5)

## 2014-10-29 LAB — I-STAT TROPONIN, ED: Troponin i, poc: 0 ng/mL (ref 0.00–0.08)

## 2014-10-29 MED ORDER — LORAZEPAM 2 MG/ML IJ SOLN
1.0000 mg | Freq: Once | INTRAMUSCULAR | Status: AC
  Administered 2014-10-29: 1 mg via INTRAVENOUS
  Filled 2014-10-29: qty 1

## 2014-10-29 MED ORDER — DIPHENHYDRAMINE HCL 50 MG/ML IJ SOLN
25.0000 mg | Freq: Once | INTRAMUSCULAR | Status: AC
  Administered 2014-10-29: 25 mg via INTRAVENOUS
  Filled 2014-10-29: qty 1

## 2014-10-29 MED ORDER — DEXAMETHASONE SODIUM PHOSPHATE 10 MG/ML IJ SOLN
10.0000 mg | Freq: Once | INTRAMUSCULAR | Status: AC
  Administered 2014-10-29: 10 mg via INTRAVENOUS
  Filled 2014-10-29: qty 1

## 2014-10-29 MED ORDER — METOCLOPRAMIDE HCL 5 MG/ML IJ SOLN
10.0000 mg | Freq: Once | INTRAMUSCULAR | Status: AC
  Administered 2014-10-29: 10 mg via INTRAMUSCULAR
  Filled 2014-10-29: qty 2

## 2014-10-29 NOTE — ED Notes (Signed)
EKG given to EDP,Otter,MD., for review. 

## 2014-10-29 NOTE — Discharge Instructions (Signed)
You are having a headache. No specific cause was found today for your headache. It may have been a migraine or other cause of headache. Stress, anxiety, fatigue, and depression are common triggers for headaches. Your headache today does not appear to be life-threatening or require hospitalization, but often the exact cause of headaches is not determined in the emergency department. Therefore, follow-up with your doctor is very important to find out what may have caused your headache, and whether or not you need any further diagnostic testing or treatment. Sometimes headaches can appear benign (not harmful), but then more serious symptoms can develop which should prompt an immediate re-evaluation by your doctor or the emergency department. °SEEK MEDICAL ATTENTION IF: °You develop possible problems with medications prescribed.  °The medications don't resolve your headache, if it recurs , or if you have multiple episodes of vomiting or can't take fluids. °You have a change from the usual headache. °RETURN IMMEDIATELY IF you develop a sudden, severe headache or confusion, become poorly responsive or faint, develop a fever above 100.4F or problem breathing, have a change in speech, vision, swallowing, or understanding, or develop new weakness, numbness, tingling, incoordination, or have a seizure. °Your caregiver has diagnosed you as having chest pain that is not specific for one problem, but does not require admission.  You are at low risk for an acute heart condition or other serious illness. Chest pain comes from many different causes.  °SEEK IMMEDIATE MEDICAL ATTENTION IF: °You have severe chest pain, especially if the pain is crushing or pressure-like and spreads to the arms, back, neck, or jaw, or if you have sweating, nausea (feeling sick to your stomach), or shortness of breath. THIS IS AN EMERGENCY. Don't wait to see if the pain will go away. Get medical help at once. Call 911 or 0 (operator). DO NOT drive  yourself to the hospital.  °Your chest pain gets worse and does not go away with rest.  °You have an attack of chest pain lasting longer than usual, despite rest and treatment with the medications your caregiver has prescribed.  °You wake from sleep with chest pain or shortness of breath.  °You feel dizzy or faint.  °You have chest pain not typical of your usual pain for which you originally saw your caregiver. ° °

## 2014-10-29 NOTE — ED Notes (Signed)
Pt states started having a headache yesterday around 5 pm, states took multiple things for headache and would help but come back, states started having chest pain yesterday then went away then started back w/ L sided chest pain about an hour ago, pt states he thinks it might be a klonopin withdrawal, states he was buying it on the streets but has not had in 2 days, pt states feels some dizziness and shortness of breath, pt denies n/v/d, denies blurred vision.

## 2014-10-29 NOTE — ED Provider Notes (Signed)
CSN: [id number]     Arrival date & time 10/29/14  0617 History   First MD Initiated Contact with Patient 10/29/14 [phone]     Chief Complaint  Patient presents with  . Headache  . 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 The patient presents for headache. His headache began last night around 5 PM. He describes it as progressively worsening, left sided, throbbing, he rates the pain at 10 out of 10 at its peak, and currently 8 out of 10. He has associated photophobia without visual disturbances, nausea, vomiting. He has a past history of headaches and describes this as a "migraine." He has tried Advil, Aleve, and Goody's headache powder since 5 PM last night. Patient's last cocaine use was last night. He states he snorts about 6 g daily. He uses Afrin and has chronic nasal congestion.Denies phonophobia visual changes, stiff neck, neck pain, rash, or "thunderclap" onset. Patient also complains of chest pain which he feels is related to anxiety and possibly from Klonopin withdrawal. He is using 1 mg of Klonopin daily, which is buying off the street. He has not had any for 2 days. He describes central and left-sided chest pain which is aching, nonexertional, he he had some reflux symptoms but denies shortness of breath. He has a history of previous chest pain complaints. He states that last night his had an episode of chest pain that lasted roughly an hour and a half at rest. He had a shorter episode this morning and has no current chest pain. Patient denies suicidal ideation, Hytrin, homicidal ideation or audiovisual hallucinations. He has a past family history of a mother who was a polysubstance abuser. He declines workup for inpatient admission for his drug abuse.  However, would like outpatient resources.  Past Medical History  Diagnosis Date  . Cocaine abuse   . Depression   . Seizures   . Polysubstance abuse   . Sinus infection   . Anxiety   . GERD (gastroesophageal reflux disease)    Past Surgical History  Procedure Laterality Date  . Femur surgery     No family history on file. History  Substance Use Topics  . Smoking status: Current Every Day Smoker -- 0.50 packs/day    Types: Cigarettes  . Smokeless tobacco: Not on file  . Alcohol Use: Yes     Comment: does not drink on daily bases but has binges    Review of Systems  Ten systems reviewed and are negative for acute change, except as noted in the HPI.    Allergies  Sumatriptan and Acetaminophen  Home Medications   Prior to Admission medications   Medication Sig Start Date End Date Taking? Authorizing Provider  calcium carbonate (TUMS - DOSED IN MG ELEMENTAL CALCIUM) 500 MG chewable tablet Chew 1 tablet by mouth daily.    Historical Provider, MD  clonazePAM (KLONOPIN) 1 MG tablet Take 1 mg by mouth daily.    Historical Provider, MD  ibuprofen (ADVIL,MOTRIN) 200 MG tablet Take 400 mg by mouth every 6 (six) hours as needed for mild pain.    Historical Provider, MD  naproxen sodium (ANAPROX) 220 MG tablet Take 220 mg by mouth 2 (two) times daily with a meal.    Historical Provider, MD  oxymetazoline (AFRIN) 0.05 % nasal spray Place  2 sprays into both nostrils 2 (two) times daily.     Historical Provider, MD  pantoprazole (PROTONIX) 20 MG tablet Take 1 tablet (20 mg total) by mouth daily. Take twice daily for the next 3 days, then once daily 08/20/14   Melvenia Beam A Upstill, PA-C  promethazine (PHENERGAN) 25 MG tablet Take 1 tablet (25 mg total) by mouth every 6 (six) hours as needed for nausea or vomiting. 08/20/14   Melvenia Beam A Upstill, PA-C   BP 143/93 mmHg  Pulse 80  Temp(Src) 98.3 F (36.8 C) (Oral)  Resp 17  Ht  (1.676 m)  Wt 160 lb (72.576 kg)  BMI 25.84 kg/m2  SpO2  100% Physical Exam  Constitutional: He is oriented to person, place, and time. He appears well-developed and well-nourished. No distress.  HENT:  Head: Normocephalic and atraumatic.  Mouth/Throat: Oropharynx is clear and moist.  Eyes: Conjunctivae and EOM are normal. Pupils are equal, round, and reactive to light. No scleral icterus.  No horizontal, vertical or rotational nystagmus  Neck: Normal range of motion. Neck supple.  Full active and passive ROM without pain No midline or paraspinal tenderness No nuchal rigidity or meningeal signs  Cardiovascular: Normal rate, regular rhythm and intact distal pulses.  Exam reveals gallop.   Pulmonary/Chest: Effort normal and breath sounds normal. No respiratory distress. He has no wheezes. He has no rales.  Abdominal: Soft. Bowel sounds are normal. There is no tenderness. There is no rebound and no guarding.  Musculoskeletal: Normal range of motion. He exhibits no edema.  Lymphadenopathy:    He has no cervical adenopathy.  Neurological: He is alert and oriented to person, place, and time. No cranial nerve deficit. He exhibits normal muscle tone. Coordination normal.  Mental Status:  Alert, oriented, thought content appropriate. Speech fluent without evidence of aphasia. Able to follow 2 step commands without difficulty.  Cranial Nerves:  II:  Peripheral visual fields grossly normal, pupils equal, round, reactive to light III,IV, VI: ptosis not present, extra-ocular motions intact bilaterally  V,VII: smile symmetric, facial light touch sensation equal VIII: hearing grossly normal bilaterally  IX,X: gag reflex present  XI: bilateral shoulder shrug equal and strong XII: midline tongue extension  Motor:  5/5 in upper and lower extremities bilaterally including strong and equal grip strength and dorsiflexion/plantar flexion Sensory: Pinprick and light touch normal in all extremities.  2+ DTRS ON LEFT; DIMINISHED REFLEXES ON THE RIGHT. Cerebellar:  normal finger-to-nose with bilateral upper extremities Gait: normal gait and balance CV: distal pulses palpable throughout   Skin: Skin is warm and dry. No rash noted. He is not diaphoretic.  Psychiatric: He has a normal mood and affect. His behavior is normal. Judgment and thought content normal.  Nursing note and vitals reviewed.   ED Course  Procedures (including critical care time) Labs Review Labs Reviewed - No data to display  Imaging Review No results found.   EKG Interpretation   Date/Time:  Thursday October 29 2014 06:40:13 EST Ventricular Rate:  87 PR Interval:  342 QRS Duration: 88 QT Interval:  324 QTC Calculation: 390 R Axis:   77 Text Interpretation:  Sinus or ectopic atrial rhythm Prolonged PR interval  RSR' or QR pattern in V1 suggests right ventricular conduction delay  Anteroseptal infarct, old 1st degree block new from prior Confirmed by  OTTER  MD, OLGA ([id number]) on 10/29/2014 6:45:59 AM      MDM   Final diagnoses:  Chest pain, atypical  Bad headache  Polysubstance abuse  Cocaine dependence with cocaine-induced anxiety disorder  Essential hypertension    7:45 AM BP 143/93 mmHg  Pulse 80  Temp(Src) 98.3 F (36.8 C) (Oral)  Resp 17  Ht 5\' 6"  (1.676 m)  Wt 160 lb (72.576 kg)  BMI 25.84 kg/m2  SpO2 100% PATIENT WITH HA. In the setting of Left sided headache with hyporeflexia on the Right side. Will CT head. Patienht with Prolonged PR interval on EKG wihich appears unchanged. No signs af acute ischemia or significant changes.   8:11 AM BP 143/93 mmHg  Pulse 80  Temp(Src) 98.3 F (36.8 C) (Oral)  Resp 17  Ht 5\' 6"  (1.676 m)  Wt 160 lb (72.576 kg)  BMI 25.84 kg/m2  SpO2 100% Patient with akathisia secondary to Reglan. Refusing CT at this time due to his restlessness. He will be given ativan. ] \     Patient CT hegative. Headache is improved. Discussed with Dr. Patria Maneampos. No concern for further work up of cp complaint . No repeat cp  in the ED.   Pt HA treated and improved while in ED.  Presentation is like pts typical HA and non concerning for [facility], ICH, Meningitis, or temporal arteritis. Pt is afebrile with no focal neuro deficits, nuchal rigidity, or change in vision. Pt is to follow up with PCP to discuss prophylactic medication. Pt verbalizes understanding and is agreeable with plan to dc.    Arthor Captainbigail Josiel Gahm, PA-C 10/31/14 1439  Lyanne CoKevin M Campos, MD 11/01/14 [phone]

## 2014-10-29 NOTE — ED Notes (Signed)
Pt's visitor reports that Pt has become nauseous, since receiving medications.  Visitor made aware that Pt was given anti-emetic.  Abigail PA made aware and asked staff to re-evaluate in 15 minutes.

## 2014-10-29 NOTE — ED Notes (Addendum)
Pt. On cardiac monitor. 

## 2014-11-04 ENCOUNTER — Emergency Department (HOSPITAL_COMMUNITY)
Admission: EM | Admit: 2014-11-04 | Discharge: 2014-11-05 | Disposition: A | Discharge status: Home / Self Care | Payer: Self-pay | Attending: Emergency Medicine | Admitting: Emergency Medicine

## 2014-11-04 ENCOUNTER — Emergency Department (HOSPITAL_COMMUNITY)
Admission: EM | Admit: 2014-11-04 | Discharge: 2014-11-04 | Discharge status: Against Medical Advice | Payer: Self-pay | Attending: Emergency Medicine | Admitting: Emergency Medicine

## 2014-11-04 ENCOUNTER — Emergency Department (HOSPITAL_COMMUNITY): Payer: Self-pay

## 2014-11-04 ENCOUNTER — Encounter (HOSPITAL_COMMUNITY): Payer: Self-pay | Admitting: *Deleted

## 2014-11-04 ENCOUNTER — Encounter (HOSPITAL_COMMUNITY): Payer: Self-pay | Admitting: Emergency Medicine

## 2014-11-04 DIAGNOSIS — Z79899 Other long term (current) drug therapy: Secondary | ICD-10-CM | POA: Insufficient documentation

## 2014-11-04 DIAGNOSIS — R200 Anesthesia of skin: Secondary | ICD-10-CM | POA: Insufficient documentation

## 2014-11-04 DIAGNOSIS — Z720 Tobacco use: Secondary | ICD-10-CM | POA: Insufficient documentation

## 2014-11-04 DIAGNOSIS — R51 Headache: Secondary | ICD-10-CM | POA: Insufficient documentation

## 2014-11-04 DIAGNOSIS — Z8669 Personal history of other diseases of the nervous system and sense organs: Secondary | ICD-10-CM | POA: Insufficient documentation

## 2014-11-04 DIAGNOSIS — F419 Anxiety disorder, unspecified: Secondary | ICD-10-CM | POA: Insufficient documentation

## 2014-11-04 DIAGNOSIS — F329 Major depressive disorder, single episode, unspecified: Secondary | ICD-10-CM | POA: Insufficient documentation

## 2014-11-04 DIAGNOSIS — R519 Headache, unspecified: Secondary | ICD-10-CM

## 2014-11-04 DIAGNOSIS — K219 Gastro-esophageal reflux disease without esophagitis: Secondary | ICD-10-CM | POA: Insufficient documentation

## 2014-11-04 DIAGNOSIS — Z791 Long term (current) use of non-steroidal anti-inflammatories (NSAID): Secondary | ICD-10-CM | POA: Insufficient documentation

## 2014-11-04 DIAGNOSIS — Z8709 Personal history of other diseases of the respiratory system: Secondary | ICD-10-CM | POA: Insufficient documentation

## 2014-11-04 MED ORDER — KETOROLAC TROMETHAMINE 30 MG/ML IJ SOLN
30.0000 mg | Freq: Once | INTRAMUSCULAR | Status: DC
  Filled 2014-11-04: qty 1

## 2014-11-04 MED ORDER — HYDROMORPHONE HCL 1 MG/ML IJ SOLN
1.0000 mg | Freq: Once | INTRAMUSCULAR | Status: AC
Start: 2014-11-05 — End: 2014-11-05
  Administered 2014-11-05: 1 mg via INTRAVENOUS
  Filled 2014-11-04: qty 1

## 2014-11-04 MED ORDER — METOCLOPRAMIDE HCL 5 MG/ML IJ SOLN
10.0000 mg | Freq: Once | INTRAMUSCULAR | Status: DC
  Filled 2014-11-04: qty 2

## 2014-11-04 MED ORDER — ONDANSETRON HCL 4 MG/2ML IJ SOLN
4.0000 mg | Freq: Once | INTRAMUSCULAR | Status: AC
Start: 2014-11-05 — End: 2014-11-05
  Administered 2014-11-05: 4 mg via INTRAVENOUS
  Filled 2014-11-04: qty 2

## 2014-11-04 MED ORDER — DIPHENHYDRAMINE HCL 50 MG/ML IJ SOLN
25.0000 mg | Freq: Once | INTRAMUSCULAR | Status: DC
Start: 2014-11-04 — End: 2014-11-05
  Filled 2014-11-04: qty 1

## 2014-11-04 NOTE — ED Notes (Signed)
Pt sts headaches have been coming and going on and off for a few months.  Pt sts Naproxen at home which sometimes help, but has been to hospital 4-5 times in the last few months.

## 2014-11-04 NOTE — ED Notes (Signed)
Pt does not want blood drawn.

## 2014-11-04 NOTE — ED Notes (Signed)
No response when called for room 

## 2014-11-04 NOTE — ED Notes (Addendum)
Pt reports pain on entire left side of body including head- also reports numbness to L side of body and difficulty seeing out of L eye. sts this started 30 mins ago. Hx of similar sx in past with migraines. Grip strengths equal. NO code stroke at this time per Dr. Karma GanjaLinker

## 2014-11-04 NOTE — ED Notes (Signed)
See Eme RN note at 1816.

## 2014-11-05 MED ORDER — TRAMADOL HCL 50 MG PO TABS: 50.0000 mg | ORAL_TABLET | Freq: Four times a day (QID) | ORAL | Status: DC | PRN

## 2014-11-05 NOTE — ED Notes (Signed)
Pt. Left with all belongings and refused wheelchair 

## 2014-11-05 NOTE — ED Notes (Signed)
[facility] and [facility]  [facility]  MD made aware.

## 2014-11-05 NOTE — Discharge Instructions (Signed)
Follow up as needed

## 2014-11-05 NOTE — ED Notes (Signed)
Pt family in the room.  Brought pt meal.  This RN informed pt he cannot eat at this time.

## 2014-11-06 NOTE — ED Provider Notes (Signed)
CSN: [id number]     Arrival date & time 11/04/14  2135 History   First MD Initiated Contact with Patient 11/04/14 2302     Chief Complaint  Patient presents with  . Headache     (Consider location/radiation/quality/duration/timing/severity/associated sxs/prior Treatment) Patient is a 39 y.o. male presenting with headaches. The history is provided by the patient (the pt complains of a headache).  Headache Pain location:  Generalized Quality:  Dull Radiates to:  Does not radiate Severity currently:  6/10 Severity at highest:  8/10 Onset quality:  Gradual Timing:  Constant Progression:  Waxing and waning Chronicity:  Recurrent Context: not activity   Associated symptoms: no abdominal pain, no back pain, no congestion, no cough, no diarrhea, no fatigue, no seizures and no sinus pressure     Past Medical History  Diagnosis Date  . Cocaine abuse   . Depression   . Seizures   . Polysubstance abuse   . Sinus infection   . Anxiety   . GERD (gastroesophageal reflux disease)    Past Surgical History  Procedure Laterality Date  . Femur surgery     History reviewed. No pertinent family history. History  Substance Use Topics  . Smoking status: Current Every Day Smoker -- 0.50 packs/day    Types: Cigarettes  . Smokeless tobacco: Not on file  . Alcohol Use: Yes     Comment: does not drink on daily bases but has binges    Review of Systems  Constitutional: Negative for appetite change and fatigue.  HENT: Negative for congestion, ear discharge and sinus pressure.   Eyes: Negative for discharge.  Respiratory: Negative for cough.   Cardiovascular: Negative for chest pain.  Gastrointestinal: Negative for abdominal pain and diarrhea.  Genitourinary: Negative for frequency and hematuria.  Musculoskeletal: Negative for back pain.  Skin: Negative for rash.  Neurological: Positive for headaches. Negative for seizures.  Psychiatric/Behavioral: Negative for hallucinations.       Allergies  Sumatriptan and Acetaminophen  Home Medications   Prior to Admission medications   Medication Sig Start Date End Date Taking? Authorizing Provider  calcium carbonate (TUMS - DOSED IN MG ELEMENTAL CALCIUM) 500 MG chewable tablet Chew 1 tablet by mouth daily.    Historical Provider, MD  clonazePAM (KLONOPIN) 1 MG tablet Take 1 mg by mouth daily.    Historical Provider, MD  naproxen sodium (ANAPROX) 220 MG tablet Take 220 mg by mouth 2 (two) times daily with a meal.    Historical Provider, MD  oxymetazoline (AFRIN) 0.05 % nasal spray Place 2 sprays into both nostrils 2 (two) times daily.     Historical Provider, MD  pantoprazole (PROTONIX) 20 MG tablet Take 1 tablet (20 mg total) by mouth daily. Take twice daily for the next 3 days, then once daily Patient not taking: Reported on 10/29/2014 08/20/14   Melvenia BeamShari A Upstill, PA-C  promethazine (PHENERGAN) 25 MG tablet Take 1 tablet (25 mg total) by mouth every 6 (six) hours as needed for nausea or vomiting. Patient not taking: Reported on 10/29/2014 08/20/14   Melvenia BeamShari A Upstill, PA-C  traMADol (ULTRAM) 50 MG tablet Take 1 tablet (50 mg total) by mouth every 6 (six) hours as needed. 11/05/14   Benny LennertJoseph L Tiaunna Buford, MD   BP 114/80 mmHg  Pulse 80  Temp(Src) 98.7 F (37.1 C)  Resp 18  SpO2 98% Physical Exam  Constitutional: He is oriented to person, place, and time. He appears well-developed.  HENT:  Head: Normocephalic.  Eyes: Conjunctivae and EOM  are normal. No scleral icterus.  Neck: Neck supple. No thyromegaly present.  Cardiovascular: Normal rate and regular rhythm.  Exam reveals no gallop and no friction rub.   No murmur heard. Pulmonary/Chest: No stridor. He has no wheezes. He has no rales. He exhibits no tenderness.  Abdominal: He exhibits no distension. There is no tenderness. There is no rebound.  Musculoskeletal: Normal range of motion. He exhibits no edema.  Lymphadenopathy:    He has no cervical adenopathy.   Neurological: He is oriented to person, place, and time. He exhibits normal muscle tone. Coordination normal.  Skin: No rash noted. No erythema.  Psychiatric: He has a normal mood and affect. His behavior is normal.    ED Course  Procedures (including critical care time) Labs Review Labs Reviewed - No data to display  Imaging Review No results found.   EKG Interpretation None      MDM   Final diagnoses:  Headache behind the eyes    Headache improved    Benny Lennert, MD 11/06/14 [phone]

## 2014-11-14 ENCOUNTER — Emergency Department (HOSPITAL_COMMUNITY): Payer: Self-pay

## 2014-11-14 ENCOUNTER — Emergency Department (HOSPITAL_COMMUNITY)
Admission: EM | Admit: 2014-11-14 | Discharge: 2014-11-15 | Disposition: A | Discharge status: Home / Self Care | Payer: Self-pay | Attending: Emergency Medicine | Admitting: Emergency Medicine

## 2014-11-14 ENCOUNTER — Encounter (HOSPITAL_COMMUNITY): Payer: Self-pay | Admitting: Emergency Medicine

## 2014-11-14 DIAGNOSIS — F1010 Alcohol abuse, uncomplicated: Secondary | ICD-10-CM | POA: Insufficient documentation

## 2014-11-14 DIAGNOSIS — F1410 Cocaine abuse, uncomplicated: Secondary | ICD-10-CM | POA: Insufficient documentation

## 2014-11-14 DIAGNOSIS — F419 Anxiety disorder, unspecified: Secondary | ICD-10-CM | POA: Insufficient documentation

## 2014-11-14 DIAGNOSIS — R61 Generalized hyperhidrosis: Secondary | ICD-10-CM | POA: Insufficient documentation

## 2014-11-14 DIAGNOSIS — F329 Major depressive disorder, single episode, unspecified: Secondary | ICD-10-CM | POA: Insufficient documentation

## 2014-11-14 DIAGNOSIS — K219 Gastro-esophageal reflux disease without esophagitis: Secondary | ICD-10-CM | POA: Insufficient documentation

## 2014-11-14 DIAGNOSIS — Z720 Tobacco use: Secondary | ICD-10-CM | POA: Insufficient documentation

## 2014-11-14 DIAGNOSIS — R000 Tachycardia, unspecified: Secondary | ICD-10-CM | POA: Insufficient documentation

## 2014-11-14 DIAGNOSIS — Z79899 Other long term (current) drug therapy: Secondary | ICD-10-CM | POA: Insufficient documentation

## 2014-11-14 DIAGNOSIS — R0602 Shortness of breath: Secondary | ICD-10-CM | POA: Insufficient documentation

## 2014-11-14 LAB — CBC WITH DIFFERENTIAL/PLATELET
Basophils Absolute: 0 10*3/uL (ref 0.0–0.1)
Basophils Relative: 0 % (ref 0–1)
EOS ABS: 0.1 10*3/uL (ref 0.0–0.7)
EOS PCT: 1 % (ref 0–5)
HCT: 46.2 % (ref 39.0–52.0)
Hemoglobin: 16.6 g/dL (ref 13.0–17.0)
LYMPHS PCT: 34 % (ref 12–46)
Lymphs Abs: 6 10*3/uL — ABNORMAL HIGH (ref 0.7–4.0)
MCH: 33.8 pg (ref 26.0–34.0)
MCHC: 35.9 g/dL (ref 30.0–36.0)
MCV: 94.1 fL (ref 78.0–100.0)
Monocytes Absolute: 1.1 10*3/uL — ABNORMAL HIGH (ref 0.1–1.0)
Monocytes Relative: 6 % (ref 3–12)
Neutro Abs: 10.5 10*3/uL — ABNORMAL HIGH (ref 1.7–7.7)
Neutrophils Relative %: 59 % (ref 43–77)
PLATELETS: 218 10*3/uL (ref 150–400)
RBC: 4.91 MIL/uL (ref 4.22–5.81)
RDW: 12.1 % (ref 11.5–15.5)
WBC: 17.8 10*3/uL — ABNORMAL HIGH (ref 4.0–10.5)

## 2014-11-14 LAB — ETHANOL: Alcohol, Ethyl (B): 131 mg/dL — ABNORMAL HIGH (ref 0–9)

## 2014-11-14 LAB — COMPREHENSIVE METABOLIC PANEL
ALBUMIN: 5.1 g/dL (ref 3.5–5.2)
ALT: 24 U/L (ref 0–53)
AST: 25 U/L (ref 0–37)
Alkaline Phosphatase: 72 U/L (ref 39–117)
Anion gap: 17 — ABNORMAL HIGH (ref 5–15)
BUN: 13 mg/dL (ref 6–23)
CHLORIDE: 103 mmol/L (ref 96–112)
CO2: 19 mmol/L (ref 19–32)
CREATININE: 1.2 mg/dL (ref 0.50–1.35)
Calcium: 10.3 mg/dL (ref 8.4–10.5)
GFR, EST AFRICAN AMERICAN: 87 mL/min — AB (ref >90)
GFR, EST NON AFRICAN AMERICAN: 75 mL/min — AB (ref >90)
GLUCOSE: 68 mg/dL — AB (ref 70–99)
POTASSIUM: 3.9 mmol/L (ref 3.5–5.1)
SODIUM: 139 mmol/L (ref 135–145)
Total Bilirubin: 0.7 mg/dL (ref 0.3–1.2)
Total Protein: 8.6 g/dL — ABNORMAL HIGH (ref 6.0–8.3)

## 2014-11-14 LAB — RAPID URINE DRUG SCREEN, HOSP PERFORMED
Amphetamines: NOT DETECTED
BARBITURATES: NOT DETECTED
Benzodiazepines: NOT DETECTED
COCAINE: POSITIVE — AB
Opiates: NOT DETECTED
Tetrahydrocannabinol: NOT DETECTED

## 2014-11-14 LAB — CBG MONITORING, ED
Glucose-Capillary: 67 mg/dL — ABNORMAL LOW (ref 70–99)
Glucose-Capillary: 101 mg/dL — ABNORMAL HIGH (ref 70–99)

## 2014-11-14 LAB — ACETAMINOPHEN LEVEL: Acetaminophen (Tylenol), Serum: <10 ug/mL — ABNORMAL LOW (ref 10–30)

## 2014-11-14 LAB — SALICYLATE LEVEL: Salicylate Lvl: <4 mg/dL (ref 2.8–20.0)

## 2014-11-14 LAB — TROPONIN I: Troponin I: <0.03 ng/mL (ref <0.031)

## 2014-11-14 MED ORDER — SODIUM CHLORIDE 0.9 % IV BOLUS (SEPSIS)
1000.0000 mL | Freq: Once | INTRAVENOUS | Status: AC
  Administered 2014-11-14: 1000 mL via INTRAVENOUS

## 2014-11-14 MED ORDER — SODIUM CHLORIDE 0.9 % IV BOLUS (SEPSIS)
500.0000 mL | Freq: Once | INTRAVENOUS | Status: AC
Start: 2014-11-14 — End: 2014-11-15
  Administered 2014-11-15: 500 mL via INTRAVENOUS

## 2014-11-14 NOTE — ED Provider Notes (Signed)
CSN: [id number]     Arrival date & time 11/14/14  1907 History   First MD Initiated Contact with Patient 11/14/14 1922     Chief Complaint  Patient presents with  . Drug Overdose    The patient said he did "alot of cocaine".  He is not answering any questions.  The only thing he says is "save my life, i did alot of cocaine".     (Consider location/radiation/quality/duration/timing/severity/associated sxs/prior Treatment) Patient is a 39 y.o. male presenting with Overdose. The history is provided by the patient and medical records. No language interpreter was used.  Drug Overdose     Henry Stanley is a 39 y.o. male  with a hx of polysubstance abuse, depression, anxiety (taking Klonopin daily) presents to the Emergency Department complaining of gradual, persistent, progressively worsening drug and alcohol overdose with unknown onset. Patient reports that he has had 2 fifths of liquor tonight along with 5 g of cocaine. He reports he finished his last line of cocaine just prior to arrival. Patient reports that he drinks approximately one fifth of liquor each day in addition to doing 1 g of cocaine per day.  He is uncooperative and refuses to answer most questions.  He continues to state that he did "a lot of cocaine."    Level 5 caveat  Past Medical History  Diagnosis Date  . Cocaine abuse   . Depression   . Seizures   . Polysubstance abuse   . Sinus infection   . Anxiety   . GERD (gastroesophageal reflux disease)    Past Surgical History  Procedure Laterality Date  . Femur surgery     History reviewed. No pertinent family history. History  Substance Use Topics  . Smoking status: Current Every Day Smoker -- 0.50 packs/day    Types: Cigarettes  . Smokeless tobacco: Not on file  . Alcohol Use: Yes     Comment: does not drink on daily bases but has binges    Review of Systems  Unable to perform ROS: Other      Allergies  Sumatriptan; Acetaminophen; and Benadryl  Home  Medications   Prior to Admission medications   Medication Sig Start Date End Date Taking? Authorizing Provider  naproxen sodium (ANAPROX) 220 MG tablet Take 220 mg by mouth 2 (two) times daily as needed (for pain).   Yes Historical Provider, MD  pantoprazole (PROTONIX) 20 MG tablet Take 1 tablet (20 mg total) by mouth daily. Take twice daily for the next 3 days, then once daily Patient not taking: Reported on 10/29/2014 08/20/14   Melvenia Beam A Upstill, PA-C  promethazine (PHENERGAN) 25 MG tablet Take 1 tablet (25 mg total) by mouth every 6 (six) hours as needed for nausea or vomiting. Patient not taking: Reported on 10/29/2014 08/20/14   Melvenia Beam A Upstill, PA-C  traMADol (ULTRAM) 50 MG tablet Take 1 tablet (50 mg total) by mouth every 6 (six) hours as needed. 11/05/14   Benny Lennert, MD   BP 115/63 mmHg  Pulse 110  Resp 13  SpO2 95% Physical Exam  Constitutional: He appears well-developed and well-nourished. He appears distressed.  Awake, alert, nontoxic appearance  HENT:  Head: Normocephalic and atraumatic.  Mouth/Throat: Oropharynx is clear and moist. No oropharyngeal exudate.  Eyes: Conjunctivae are normal. No scleral icterus.  Neck: Normal range of motion. Neck supple.  Cardiovascular: Regular rhythm, normal heart sounds and intact distal pulses.  Tachycardia present.   Pulses:      Radial pulses  are 2+ on the right side, and 2+ on the left side.  Tachycardia to 130   Pulmonary/Chest: Effort normal and breath sounds normal. Tachypnea noted. No respiratory distress. He has no wheezes. He has no rhonchi.  Equal chest expansion Tachypneic Clinical breath sounds  Abdominal: Soft. Bowel sounds are normal. He exhibits no distension and no mass. There is no tenderness. There is no rebound and no guarding.  Musculoskeletal: Normal range of motion. He exhibits no edema.  Lymphadenopathy:    He has no cervical adenopathy.  Neurological: He is alert. He exhibits normal muscle tone. Coordination  normal.  Speech is clear and goal oriented Moves extremities without ataxia  Skin: Skin is warm. He is diaphoretic. No erythema. There is pallor.  Psychiatric: He has a normal mood and affect.  Nursing note and vitals reviewed.   ED Course  Procedures (including critical care time) Labs Review Labs Reviewed  COMPREHENSIVE METABOLIC PANEL - Abnormal; Notable for the following:    Glucose, Bld 68 (*)    Total Protein 8.6 (*)    GFR calc non Af Amer 75 (*)    GFR calc Af Amer 87 (*)    Anion gap 17 (*)    All other components within normal limits  ETHANOL - Abnormal; Notable for the following:    Alcohol, Ethyl (B) 131 (*)    All other components within normal limits  ACETAMINOPHEN LEVEL - Abnormal; Notable for the following:    Acetaminophen (Tylenol), Serum <10.0 (*)    All other components within normal limits  URINE RAPID DRUG SCREEN (HOSP PERFORMED) - Abnormal; Notable for the following:    Cocaine POSITIVE (*)    All other components within normal limits  CBC WITH DIFFERENTIAL/PLATELET - Abnormal; Notable for the following:    WBC 17.8 (*)    Neutro Abs 10.5 (*)    Lymphs Abs 6.0 (*)    Monocytes Absolute 1.1 (*)    All other components within normal limits  CBG MONITORING, ED - Abnormal; Notable for the following:    Glucose-Capillary 67 (*)    All other components within normal limits  CBG MONITORING, ED - Abnormal; Notable for the following:    Glucose-Capillary 101 (*)    All other components within normal limits  SALICYLATE LEVEL  TROPONIN I    Imaging Review Dg Chest Port 1 View  11/14/2014   CLINICAL DATA:  Drug overdose.  Shortness of breath.  EXAM: PORTABLE CHEST - 1 VIEW  COMPARISON:  10/29/2014  FINDINGS: Midline trachea. Normal heart size and mediastinal contours. No pleural effusion or pneumothorax. Numerous leads and wires project over the chest. Clear lungs.  IMPRESSION: Normal chest.   Electronically Signed   By: Jeronimo Greaves M.D.   On: 11/14/2014 21:14      EKG Interpretation   Date/Time:  Saturday November 14 2014 19:16:51 EST Ventricular Rate:  144 PR Interval:  128 QRS Duration: 78 QT Interval:  282 QTC Calculation: 436 R Axis:   -157 Text Interpretation:  Sinus tachycardia Biatrial enlargement Right  superior axis deviation Possible Anterior infarct , age undetermined  Abnormal ECG Confirmed by POLLINA  MD, CHRISTOPHER [phone]) on 11/14/2014  7:23:17 PM      MDM   Final diagnoses:  SOB (shortness of breath)  Cocaine abuse  ETOH abuse  Tachycardia   Henry Stanley presents with acute alcohol and cocaine toxicity. He is tachycardic, tachypneic and diaphoretic.  He refuses to answer most questions, demanding water.  He is alert and oriented with a patent airway.  He does deny chest pain.  He is not vomiting. He is alert and oriented.  10:07 PM Pt remains alert and oriented.  Tachycardia improved. Repeat blood glucose 67. We'll have patient eat and drink. He reports he is feeling better. Patient is no longer agitated or diaphoretic. He's had no vomiting here in the emergency department. Patient with large leukocytosis, labs are otherwise reassuring  UA pending.  Pt denies focal symptoms at this time.  12:50AM Patient has ambulated in the hall without difficulty. He continues to have mild tachycardia as expected with a large amount of pain that he used. He has tolerated by mouth without difficulty. He is alert and oriented and in no apparent distress. We'll discharge home. Patient counseled about his drug and alcohol usage.    I have personally reviewed patient's vitals, nursing note and any pertinent labs or imaging.  I performed an undressed physical exam.    It has been determined that no acute conditions requiring further emergency intervention are present at this time. The patient/guardian have been advised of the diagnosis and plan. I reviewed all labs and imaging including any potential incidental findings. We have discussed  signs and symptoms that warrant return to the ED and they are listed in the discharge instructions.    Vital signs are stable at discharge.   BP 115/63 mmHg  Pulse 110  Resp 13  SpO2 95%        Dierdre ForthHannah Marcelus Dubberly, PA-C 11/15/14 0125  Gilda Creasehristopher J. Pollina, MD 11/15/14 [phone]

## 2014-11-14 NOTE — ED Notes (Signed)
PA Hannah at bedside. 

## 2014-11-14 NOTE — ED Notes (Addendum)
Pt uncooperative during PA and RN assessment. Pt refuses to answer some questions. Pt keeps stating he took cocaine and "needs help." Pt also admits to drinking ETOH. Pt states he drinks "a fifth a vodka and does 1 gram of cocaine" per day.

## 2014-11-14 NOTE — ED Notes (Addendum)
The patient said he did "alot of cocaine".  He is not answering any questions.  The only thing he says is "save my life, i did alot of cocaine".  The patient is uncooperative and not answering questions.  He says he is hot and feels like he is going "numb".  The only thing he can say is that he did 5grams of cocaine.

## 2014-11-14 NOTE — ED Notes (Signed)
X-ray at bedside

## 2014-11-14 NOTE — ED Notes (Signed)
PT reports he cannot walk right now, will try again in 10 minutes. PA informed.

## 2014-11-15 NOTE — ED Notes (Signed)
Pt. Left with all belongings and refused wheelchair 

## 2014-11-15 NOTE — Discharge Instructions (Signed)
1. Medications: usual home medications 2. Treatment: rest, drink plenty of fluids,  3. Follow Up: Please followup with your primary doctor in 2-3 days for discussion of your diagnoses and further evaluation after today's visit; if you do not have a primary care doctor use the resource guide provided to find one; Please return to the ER for worsening symptoms   Cocaine Cocaine stimulates the central nervous system. As a stimulant, cocaine has the ability to improve athletic performance through increasing speed, endurance, and concentration, as well as decreasing fatigue. Although cocaine may seem to be beneficial for athletics, it is highly addicting and has many debilitating side effects. Cocaine has caused the deaths of many athletes, and its use is banned by every major athletic organization in the world. The clinical effect of cocaine (the high) is very short in duration. Cocaine works in the brain by altering the normal concentrations of chemicals that stimulate the brain cells.  WHY ATHLETES USE IT  Many athletes use cocaine for its central nervous system stimulating properties. It is also used as a recreational drug due to the euphoric felling it produces.  ADVERSE EFFECTS   Sleep disturbances.  Abnormal heart rhythms.  Stroke.  Heart attack.  Seizures.  Elevated blood pressure.  Death.  Paranoia (feeling that people want to hurt you).  Panic attacks (sudden feelings of anxiety or shortness of breath).  Suicidal behavior (wanting to kill yourself).  Homicidal behavior (wanting to kill other people).  Depression (feeling very sad, having decreased energy for activities).  Poor athletic performance. PHARMACOLOGY  Cocaine acts on the body for a short period of time; the clinical effects may last less than1 hour. Since most athletic competitions last for more than 1 hour, cocaine use may not improve athletic performance. The use of cocaine makes individuals much more  susceptible for serious conditions such as seizures, arrhythmia (irregular heart beat), and strokes. Even a single dose of cocaine can be detected on a drug test for up to about 30 hours.  PREVENTION Most athletes use cocaine as a recreational drug and not for the purpose of enhancing athletic performance. To prevent the use of cocaine, athletes must be educated on its side effects and the risk of addiction. If an athlete is found using cocaine, counseling and treatment are almost always required.  Document Released: 08/28/2005 Document Revised: 11/20/2011 Document Reviewed: 12/10/2008 [facility] Center[facility] Patient Information 2015 [employer], [place]. This information is not intended to replace advice given to you by your health care provider. Make sure you discuss any questions you have with your health care provider.

## 2014-11-15 NOTE — ED Notes (Signed)
Pt. Ambulated in hall independently with no difficulties

## 2014-12-06 ENCOUNTER — Emergency Department (HOSPITAL_COMMUNITY)
Admission: EM | Admit: 2014-12-06 | Discharge: 2014-12-06 | Discharge status: Against Medical Advice | Payer: Self-pay | Attending: Emergency Medicine | Admitting: Emergency Medicine

## 2014-12-06 ENCOUNTER — Encounter (HOSPITAL_COMMUNITY): Payer: Self-pay | Admitting: Emergency Medicine

## 2014-12-06 DIAGNOSIS — Z720 Tobacco use: Secondary | ICD-10-CM | POA: Insufficient documentation

## 2014-12-06 DIAGNOSIS — F419 Anxiety disorder, unspecified: Secondary | ICD-10-CM | POA: Insufficient documentation

## 2014-12-06 DIAGNOSIS — Z5321 Procedure and treatment not carried out due to patient leaving prior to being seen by health care provider: Secondary | ICD-10-CM

## 2014-12-06 NOTE — ED Notes (Signed)
Pt did his normal amount of cocaine at approx. 6am this morning. States he woke up this evening around 5pm and started having a panic attack which is normal for him after he uses cocaine. Takes someone else's klonopin which helps him. L side of face is spasming per pt. Not visible to EMS. Alert and oriented.

## 2014-12-06 NOTE — ED Notes (Signed)
Bed: [facility] Expected date:  Expected time:  Means of arrival:  Comments: EMS anxiety after cocaine

## 2014-12-06 NOTE — ED Notes (Signed)
Pt called multiple times for room. No answer in lobby.

## 2014-12-07 ENCOUNTER — Encounter (HOSPITAL_COMMUNITY): Payer: Self-pay | Admitting: Emergency Medicine

## 2014-12-07 ENCOUNTER — Emergency Department (HOSPITAL_COMMUNITY)
Admission: EM | Admit: 2014-12-07 | Discharge: 2014-12-07 | Disposition: A | Discharge status: Home / Self Care | Payer: Self-pay | Attending: Emergency Medicine | Admitting: Emergency Medicine

## 2014-12-07 DIAGNOSIS — R51 Headache: Secondary | ICD-10-CM | POA: Insufficient documentation

## 2014-12-07 DIAGNOSIS — K219 Gastro-esophageal reflux disease without esophagitis: Secondary | ICD-10-CM | POA: Insufficient documentation

## 2014-12-07 DIAGNOSIS — Z720 Tobacco use: Secondary | ICD-10-CM | POA: Insufficient documentation

## 2014-12-07 DIAGNOSIS — F410 Panic disorder [episodic paroxysmal anxiety] without agoraphobia: Secondary | ICD-10-CM | POA: Insufficient documentation

## 2014-12-07 DIAGNOSIS — F329 Major depressive disorder, single episode, unspecified: Secondary | ICD-10-CM | POA: Insufficient documentation

## 2014-12-07 DIAGNOSIS — R112 Nausea with vomiting, unspecified: Secondary | ICD-10-CM | POA: Insufficient documentation

## 2014-12-07 DIAGNOSIS — F1410 Cocaine abuse, uncomplicated: Secondary | ICD-10-CM | POA: Insufficient documentation

## 2014-12-07 DIAGNOSIS — Z8709 Personal history of other diseases of the respiratory system: Secondary | ICD-10-CM | POA: Insufficient documentation

## 2014-12-07 DIAGNOSIS — R519 Headache, unspecified: Secondary | ICD-10-CM

## 2014-12-07 DIAGNOSIS — F419 Anxiety disorder, unspecified: Secondary | ICD-10-CM | POA: Insufficient documentation

## 2014-12-07 IMAGING — CR DG CHEST 1V PORT
1 series · 1 of 1 positions shown · non-contrast
Comparison: 10/23/2013

CLINICAL DATA: Left-sided chest pain.

EXAM:
PORTABLE CHEST - 1 VIEW

[AP]
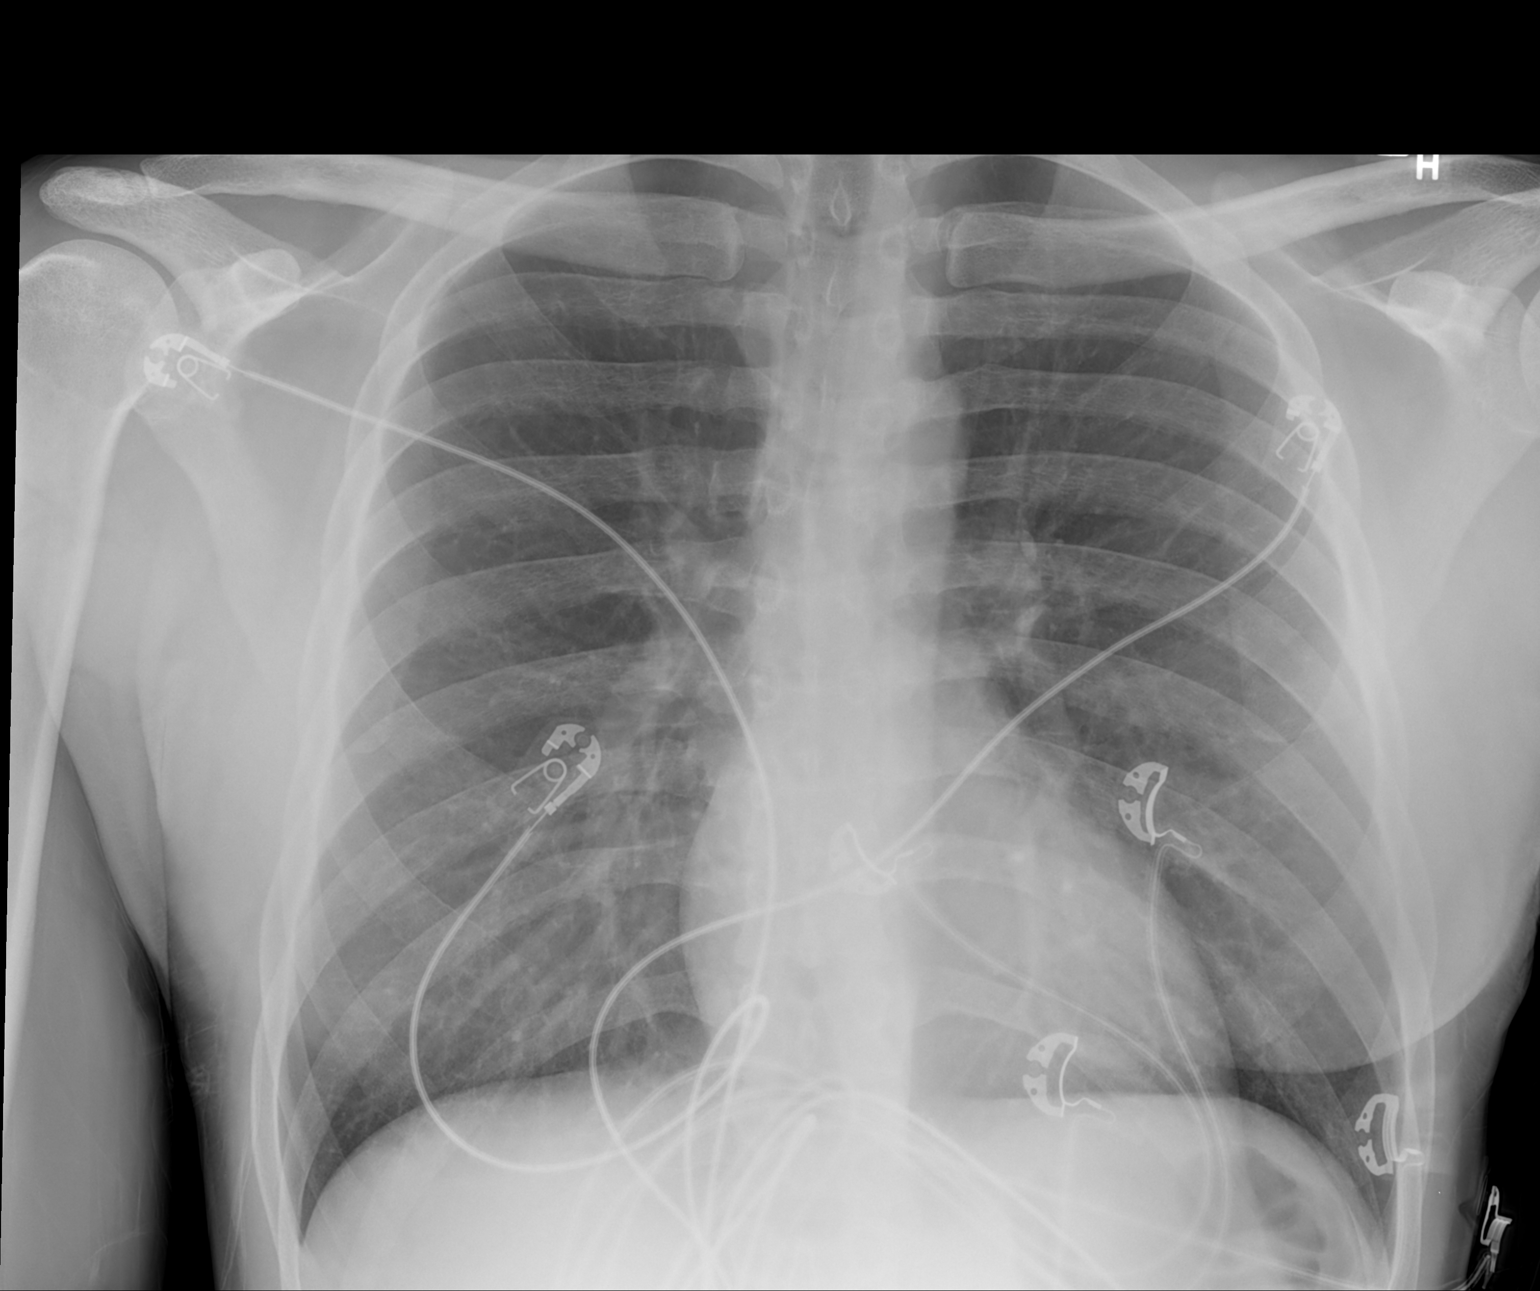

[1 of 1 positions shown; findings below may reference images not displayed]

FINDINGS: The heart size and mediastinal contours are within normal limits.
Both lungs are clear. The visualized skeletal structures are
unremarkable.
IMPRESSION: Normal chest.

## 2014-12-07 MED ORDER — KETOROLAC TROMETHAMINE 60 MG/2ML IM SOLN
60.0000 mg | Freq: Once | INTRAMUSCULAR | Status: AC
  Administered 2014-12-07: 60 mg via INTRAMUSCULAR
  Filled 2014-12-07: qty 2

## 2014-12-07 MED ORDER — ONDANSETRON 4 MG PO TBDP
4.0000 mg | ORAL_TABLET | Freq: Once | ORAL | Status: AC
  Administered 2014-12-07: 4 mg via ORAL
  Filled 2014-12-07: qty 1

## 2014-12-07 NOTE — ED Notes (Signed)
After patient was offered toradol he asked to speak to the doctor before he gets the medicine.

## 2014-12-07 NOTE — ED Notes (Signed)
Pt report HA increased to 10/10 after Toradol injection after being discharged earlier tonight.

## 2014-12-07 NOTE — Discharge Instructions (Signed)
General Headache Without Cause  A general headache is pain or discomfort felt around the head or neck area. The cause may not be found.   HOME CARE   · Keep all doctor visits.  · Only take medicines as told by your doctor.  · Lie down in a dark, quiet room when you have a headache.  · Keep a journal to find out if certain things bring on headaches. For example, write down:  ¨ What you eat and drink.  ¨ How much sleep you get.  ¨ Any change to your diet or medicines.  · Relax by getting a massage or doing other relaxing activities.  · Put ice or heat packs on the head and neck area as told by your doctor.  · Lessen stress.  · Sit up straight. Do not tighten (tense) your muscles.  · Quit smoking if you smoke.  · Lessen how much alcohol you drink.  · Lessen how much caffeine you drink, or stop drinking caffeine.  · Eat and sleep on a regular schedule.  · Get 7 to 9 hours of sleep, or as told by your doctor.  · Keep lights dim if bright lights bother you or make your headaches worse.  GET HELP RIGHT AWAY IF:   · Your headache becomes really bad.  · You have a fever.  · You have a stiff neck.  · You have trouble seeing.  · Your muscles are weak, or you lose muscle control.  · You lose your balance or have trouble walking.  · You feel like you will pass out (faint), or you pass out.  · You have really bad symptoms that are different than your first symptoms.  · You have problems with the medicines given to you by your doctor.  · Your medicines do not work.  · Your headache feels different than the other headaches.  · You feel sick to your stomach (nauseous) or throw up (vomit).  MAKE SURE YOU:   · Understand these instructions.  · Will watch your condition.  · Will get help right away if you are not doing well or get worse.  Document Released: 06/06/2008 Document Revised: 11/20/2011 Document Reviewed: 08/18/2011  ExitCare® Patient Information ©2015 [employer] LLC. This information is not intended to replace advice given to  you by your health care provider. Make sure you discuss any questions you have with your health care provider.

## 2014-12-07 NOTE — Discharge Instructions (Signed)
Stop using cocaine. You may take Tylenol or ibuprofen for headache.

## 2014-12-07 NOTE — ED Provider Notes (Signed)
CSN: [id number]     Arrival date & time 12/07/14  0440 History   None    Chief Complaint  Patient presents with  . Headache     (Consider location/radiation/quality/duration/timing/severity/associated sxs/prior Treatment) HPI Patient with chronic headaches from cocaine abuse 3 presents again tonight for continued left-sided headache. This again was gradual onset around 6 PM. He's had no nausea or vomiting. Since the headache began. He has no visual changes, focal weakness or numbness. Denies any neck pain or stiffness. Last used cocaine yesterday morning. Past Medical History  Diagnosis Date  . Cocaine abuse   . Depression   . Seizures   . Polysubstance abuse   . Sinus infection   . Anxiety   . GERD (gastroesophageal reflux disease)    Past Surgical History  Procedure Laterality Date  . Femur surgery     No family history on file. History  Substance Use Topics  . Smoking status: Current Every Day Smoker -- 0.50 packs/day    Types: Cigarettes  . Smokeless tobacco: Not on file  . Alcohol Use: Yes     Comment: does not drink on daily bases but has binges    Review of Systems  Constitutional: Negative for fever and chills.  Eyes: Negative for visual disturbance.  Musculoskeletal: Negative for neck stiffness.  Neurological: Positive for headaches. Negative for dizziness, syncope, weakness and numbness.  All other systems reviewed and are negative.     Allergies  Sumatriptan; Acetaminophen; and Benadryl  Home Medications   Prior to Admission medications   Medication Sig Start Date End Date Taking? Authorizing Provider  naproxen sodium (ANAPROX) 220 MG tablet Take 220 mg by mouth 2 (two) times daily as needed (for pain).    Historical Provider, MD  pantoprazole (PROTONIX) 20 MG tablet Take 1 tablet (20 mg total) by mouth daily. Take twice daily for the next 3 days, then once daily Patient not taking: Reported on 10/29/2014 08/20/14   Elpidio AnisShari Upstill, PA-C  promethazine  (PHENERGAN) 25 MG tablet Take 1 tablet (25 mg total) by mouth every 6 (six) hours as needed for nausea or vomiting. Patient not taking: Reported on 10/29/2014 08/20/14   Elpidio AnisShari Upstill, PA-C  traMADol (ULTRAM) 50 MG tablet Take 1 tablet (50 mg total) by mouth every 6 (six) hours as needed. 11/05/14   Bethann BerkshireJoseph Zammit, MD   BP 122/82 mmHg  Pulse 86  Temp(Src) 97.6 F (36.4 C) (Oral)  Resp 16  Ht 5\' 6"  (1.676 m)  Wt 160 lb (72.576 kg)  BMI 25.84 kg/m2  SpO2 97% Physical Exam  Constitutional: He is oriented to person, place, and time. He appears well-developed and well-nourished. No distress.  HENT:  Head: Normocephalic and atraumatic.  Eyes: EOM are normal. Pupils are equal, round, and reactive to light.  Neck: Normal range of motion. Neck supple.  Cardiovascular: Normal rate.   Pulmonary/Chest: Effort normal.  Abdominal: Soft.  Musculoskeletal: Normal range of motion.  Neurological: He is alert and oriented to person, place, and time.  Related without difficulty. 5/5 motor in all extremities. Sensation grossly intact  Skin: Skin is warm and dry. No rash noted. No erythema.  Psychiatric: He has a normal mood and affect. His behavior is normal.  Nursing note and vitals reviewed.   ED Course  Procedures (including critical care time) Labs Review Labs Reviewed - No data to display  Imaging Review No results found.   EKG Interpretation None      MDM   Final diagnoses:  Nonintractable headache, unspecified chronicity pattern, unspecified headache type  Cocaine abuse    Patient with persistent left-sided headache. He's again been instructed to stop using cocaine. He has a normal neurologic exam. He has normal vital signs. Do not believe neuro imaging is necessary at this point. See take ibuprofen and Tylenol over-the-counter as needed.    Loren Racer, MD 12/07/14 [phone]

## 2014-12-07 NOTE — ED Notes (Signed)
Pt verbalized understanding of discharge instructions including follow up.  

## 2014-12-07 NOTE — ED Notes (Signed)
Pt reports that he was seen here earlier tonight, but left "because I thought I was better." Pt stated that once he returned home he began having a HA, took Aleve and Ibuprofen, fell asleep, and woke up with increasing HA. Pt states that he used 1g of cocaine and drank "mulitple" shots of liquor tonight before coming to the ED initially. Chief complaint of that time was "facial pain" which pt denies at this time. Pt ambulatory without difficulty.

## 2014-12-07 NOTE — ED Provider Notes (Signed)
CSN: [id number]     Arrival date & time 12/07/14  0127 History   First MD Initiated Contact with Patient 12/07/14 [phone]     Chief Complaint  Patient presents with  . Headache     (Consider location/radiation/quality/duration/timing/severity/associated sxs/prior Treatment) HPI Patient presents with gradual onset left-sided headache starting this evening around 6 PM. States he has similar headaches in the past after using cocaine. Last used cocaine this morning around 6 AM. Had mild nausea and one episode of vomiting earlier in the day. No neck pain or stiffness. No vision changes. No focal weakness or numbness.  Past Medical History  Diagnosis Date  . Cocaine abuse   . Depression   . Seizures   . Polysubstance abuse   . Sinus infection   . Anxiety   . GERD (gastroesophageal reflux disease)    Past Surgical History  Procedure Laterality Date  . Femur surgery     History reviewed. No pertinent family history. History  Substance Use Topics  . Smoking status: Current Every Day Smoker -- 0.50 packs/day    Types: Cigarettes  . Smokeless tobacco: Not on file  . Alcohol Use: Yes     Comment: does not drink on daily bases but has binges    Review of Systems  Constitutional: Negative for fever and chills.  HENT: Negative for congestion and sinus pressure.   Eyes: Negative for photophobia and visual disturbance.  Respiratory: Negative for shortness of breath.   Gastrointestinal: Positive for nausea and vomiting. Negative for abdominal pain, diarrhea and constipation.  Musculoskeletal: Negative for back pain, neck pain and neck stiffness.  Skin: Negative for rash and wound.  Neurological: Positive for headaches. Negative for dizziness, weakness, light-headedness and numbness.  All other systems reviewed and are negative.     Allergies  Sumatriptan; Acetaminophen; and Benadryl  Home Medications   Prior to Admission medications   Medication Sig Start Date End Date Taking?  Authorizing Provider  naproxen sodium (ANAPROX) 220 MG tablet Take 220 mg by mouth 2 (two) times daily as needed (for pain).    Historical Provider, MD  pantoprazole (PROTONIX) 20 MG tablet Take 1 tablet (20 mg total) by mouth daily. Take twice daily for the next 3 days, then once daily Patient not taking: Reported on 10/29/2014 08/20/14   Elpidio AnisShari Upstill, PA-C  promethazine (PHENERGAN) 25 MG tablet Take 1 tablet (25 mg total) by mouth every 6 (six) hours as needed for nausea or vomiting. Patient not taking: Reported on 10/29/2014 08/20/14   Elpidio AnisShari Upstill, PA-C  traMADol (ULTRAM) 50 MG tablet Take 1 tablet (50 mg total) by mouth every 6 (six) hours as needed. 11/05/14   Bethann BerkshireJoseph Zammit, MD   BP 131/88 mmHg  Pulse 100  Temp(Src) 98.7 F (37.1 C) (Oral)  Resp 18  Ht 5\' 6"  (1.676 m)  Wt 160 lb (72.576 kg)  BMI 25.84 kg/m2  SpO2 98% Physical Exam  Constitutional: He is oriented to person, place, and time. He appears well-developed and well-nourished. No distress.  HENT:  Head: Normocephalic and atraumatic.  Mouth/Throat: Oropharynx is clear and moist.  No sinus tenderness with percussion  Eyes: EOM are normal. Pupils are equal, round, and reactive to light.  Neck: Normal range of motion. Neck supple.  No meningismus  Cardiovascular: Normal rate and regular rhythm.   Pulmonary/Chest: Effort normal and breath sounds normal. No respiratory distress. He has no wheezes. He has no rales.  Abdominal: Soft. Bowel sounds are normal. He exhibits no distension and  no mass. There is no tenderness. There is no rebound and no guarding.  Musculoskeletal: Normal range of motion. He exhibits no edema or tenderness.  Neurological: He is alert and oriented to person, place, and time.  Patient is alert and oriented x3 with clear, goal oriented speech. Patient has 5/5 motor in all extremities. Sensation is intact to light touch. Bilateral finger-to-nose is normal with no signs of dysmetria. Patient has a normal gait  and walks without assistance.  Skin: Skin is warm and dry. No rash noted. No erythema.  Psychiatric: He has a normal mood and affect. His behavior is normal.  Nursing note and vitals reviewed.   ED Course  Procedures (including critical care time) Labs Review Labs Reviewed - No data to display  Imaging Review No results found.   EKG Interpretation None      MDM   Final diagnoses:  Nonintractable headache, unspecified chronicity pattern, unspecified headache type  Cocaine abuse    Give IV Toradol and ODT Zofran. Patient is been counseled against the use of illegal drugs. Patient has a normal neurologic exam. No signs or symptoms necessitating further workup in the emergency department. Return precautions given.    Loren Racer, MD 12/07/14 [phone]

## 2015-04-27 ENCOUNTER — Emergency Department (HOSPITAL_COMMUNITY)
Admission: EM | Admit: 2015-04-27 | Discharge: 2015-04-27 | Disposition: A | Discharge status: Home / Self Care | Payer: Self-pay | Attending: Emergency Medicine | Admitting: Emergency Medicine

## 2015-04-27 ENCOUNTER — Encounter (HOSPITAL_COMMUNITY): Payer: Self-pay | Admitting: Emergency Medicine

## 2015-04-27 DIAGNOSIS — S0502XA Injury of conjunctiva and corneal abrasion without foreign body, left eye, initial encounter: Secondary | ICD-10-CM | POA: Insufficient documentation

## 2015-04-27 DIAGNOSIS — Y9289 Other specified places as the place of occurrence of the external cause: Secondary | ICD-10-CM | POA: Insufficient documentation

## 2015-04-27 DIAGNOSIS — H11002 Unspecified pterygium of left eye: Secondary | ICD-10-CM | POA: Insufficient documentation

## 2015-04-27 DIAGNOSIS — Z720 Tobacco use: Secondary | ICD-10-CM | POA: Insufficient documentation

## 2015-04-27 DIAGNOSIS — Z8719 Personal history of other diseases of the digestive system: Secondary | ICD-10-CM | POA: Insufficient documentation

## 2015-04-27 DIAGNOSIS — F329 Major depressive disorder, single episode, unspecified: Secondary | ICD-10-CM | POA: Insufficient documentation

## 2015-04-27 DIAGNOSIS — Z8709 Personal history of other diseases of the respiratory system: Secondary | ICD-10-CM | POA: Insufficient documentation

## 2015-04-27 DIAGNOSIS — Y9389 Activity, other specified: Secondary | ICD-10-CM | POA: Insufficient documentation

## 2015-04-27 DIAGNOSIS — Y998 Other external cause status: Secondary | ICD-10-CM | POA: Insufficient documentation

## 2015-04-27 DIAGNOSIS — F419 Anxiety disorder, unspecified: Secondary | ICD-10-CM | POA: Insufficient documentation

## 2015-04-27 MED ORDER — FLUORESCEIN SODIUM 1 MG OP STRP: 1.0000 | ORAL_STRIP | Freq: Once | OPHTHALMIC | Status: DC

## 2015-04-27 MED ORDER — ARTIFICIAL TEARS OP OINT
1.0000 "application " | TOPICAL_OINTMENT | Freq: Once | OPHTHALMIC | Status: AC
  Administered 2015-04-27: 1 via OPHTHALMIC
  Filled 2015-04-27: qty 3.5

## 2015-04-27 MED ORDER — KETOROLAC TROMETHAMINE 0.5 % OP SOLN
1.0000 [drp] | OPHTHALMIC | Status: AC
  Administered 2015-04-27: 1 [drp] via OPHTHALMIC
  Filled 2015-04-27: qty 3

## 2015-04-27 MED ORDER — FLUORESCEIN SODIUM 1 MG OP STRP
1.0000 | ORAL_STRIP | Freq: Once | OPHTHALMIC | Status: AC
  Administered 2015-04-27: 1 via OPHTHALMIC
  Filled 2015-04-27: qty 1

## 2015-04-27 MED ORDER — TETRACAINE HCL 0.5 % OP SOLN
1.0000 [drp] | Freq: Once | OPHTHALMIC | Status: AC
  Administered 2015-04-27: 1 [drp] via OPHTHALMIC
  Filled 2015-04-27: qty 2

## 2015-04-27 MED ORDER — TETRACAINE HCL 0.5 % OP SOLN: 2.0000 [drp] | Freq: Once | OPHTHALMIC | Status: DC

## 2015-04-27 MED ORDER — ARTIFICIAL TEARS OP OINT: TOPICAL_OINTMENT | OPHTHALMIC | Status: DC | PRN

## 2015-04-27 MED ORDER — POLYMYXIN B-TRIMETHOPRIM 10000-0.1 UNIT/ML-% OP SOLN
1.0000 [drp] | OPHTHALMIC | Status: AC
  Administered 2015-04-27: 1 [drp] via OPHTHALMIC
  Filled 2015-04-27: qty 10
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
## 2015-04-27 NOTE — Discharge Instructions (Signed)
Corneal Abrasion The cornea is the clear covering at the front and center of the eye. When looking at the colored portion of the eye (iris), you are looking through the cornea. This very thin tissue is made up of many layers. The surface layer is a single layer of cells (corneal epithelium) and is one of the most sensitive tissues in the body. If a scratch or injury causes the corneal epithelium to come off, it is called a corneal abrasion. If the injury extends to the tissues below the epithelium, the condition is called a corneal ulcer. CAUSES   Scratches.  Trauma.  Foreign body in the eye. Some people have recurrences of abrasions in the area of the original injury even after it has healed (recurrent erosion syndrome). Recurrent erosion syndrome generally improves and goes away with time. SYMPTOMS   Eye pain.  Difficulty or inability to keep the injured eye open.  The eye becomes very sensitive to light.  Recurrent erosions tend to happen suddenly, first thing in the morning, usually after waking up and opening the eye. DIAGNOSIS  Your health care provider can diagnose a corneal abrasion during an eye exam. Dye is usually placed in the eye using a drop or a small paper strip moistened by your tears. When the eye is examined with a special light, the abrasion shows up clearly because of the dye. TREATMENT   Small abrasions may be treated with antibiotic drops or ointment alone.  A pressure patch may be put over the eye. If this is done, follow your doctor's instructions for when to remove the patch. Do not drive or use machines while the eye patch is on. Judging distances is hard to do with a patch on. If the abrasion becomes infected and spreads to the deeper tissues of the cornea, a corneal ulcer can result. This is serious because it can cause corneal scarring. Corneal scars interfere with light passing through the cornea and cause a loss of vision in the involved eye. HOME CARE  INSTRUCTIONS  Use medicine or ointment as directed. Only take over-the-counter or prescription medicines for pain, discomfort, or fever as directed by your health care provider.  Do not drive or operate machinery if your eye is patched. Your ability to judge distances is impaired.  If your health care provider has given you a follow-up appointment, it is very important to keep that appointment. Not keeping the appointment could result in a severe eye infection or permanent loss of vision. If there is any problem keeping the appointment, let your health care provider know. SEEK MEDICAL CARE IF:   You have pain, light sensitivity, and a scratchy feeling in one eye or both eyes.  Your pressure patch keeps loosening up, and you can blink your eye under the patch after treatment.  Any kind of discharge develops from the eye after treatment or if the lids stick together in the morning.  You have the same symptoms in the morning as you did with the original abrasion days, weeks, or months after the abrasion healed. MAKE SURE YOU:   Understand these instructions.  Will watch your condition.  Will get help right away if you are not doing well or get worse. Document Released: 08/25/2000 Document Revised: 09/02/2013 Document Reviewed: 05/05/2013 [facility] Patient Information 2015 [place], [place]. This information is not intended to replace advice given to you by your health care provider. Make sure you discuss any questions you have with your health care provider. You have 2  things going slowly.  When your I, you have a small corneal abrasion which is being treated with Polytrim antibiotically drop .  You also have a Pterygium thinking get irritated and causing burning pain.  This is being treated with anti-inflammatory drop as well as a lubricating ointment.  You been given a referral to an ophthalmologist for further evaluation and treatment.  Please call first thing in the morning and make an  appointment Use the Polytrim drops 1 drop to the left eye every 4 hours use the Acular drops to the eye 1 drop twice a day.  You can use the lubricating ointment as needed after each drop you should wait approximately 15 minutes before applying either the lubrication or the additional drop.
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
## 2015-07-06 ENCOUNTER — Emergency Department (HOSPITAL_COMMUNITY)
Admission: EM | Admit: 2015-07-06 | Discharge: 2015-07-06 | Disposition: A | Discharge status: Home / Self Care | Payer: Self-pay

## 2015-07-06 NOTE — ED Notes (Signed)
Pt was called for triage no response

## 2015-09-14 ENCOUNTER — Encounter (HOSPITAL_COMMUNITY): Payer: Self-pay | Admitting: Emergency Medicine

## 2015-09-14 DIAGNOSIS — R51 Headache: Secondary | ICD-10-CM | POA: Insufficient documentation

## 2015-09-14 DIAGNOSIS — F17210 Nicotine dependence, cigarettes, uncomplicated: Secondary | ICD-10-CM | POA: Insufficient documentation

## 2015-09-14 NOTE — ED Notes (Addendum)
Pt states he woke up with a headache of the left side of his head that has not gone away. Pt tired 2 motrin with no relief. Pt denies any blurry vision or dizziness. Pt reports hx of migraines.

## 2015-09-15 ENCOUNTER — Emergency Department (HOSPITAL_COMMUNITY)
Admission: EM | Admit: 2015-09-15 | Discharge: 2015-09-15 | Discharge status: Against Medical Advice | Payer: Self-pay | Attending: Emergency Medicine | Admitting: Emergency Medicine

## 2015-09-15 NOTE — ED Notes (Signed)
No answer when called 2 times.  
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
## 2015-12-20 ENCOUNTER — Emergency Department (HOSPITAL_COMMUNITY)
Admission: EM | Admit: 2015-12-20 | Discharge: 2015-12-20 | Disposition: A | Discharge status: Home / Self Care | Payer: Self-pay | Attending: Emergency Medicine | Admitting: Emergency Medicine

## 2015-12-20 ENCOUNTER — Encounter (HOSPITAL_COMMUNITY): Payer: Self-pay | Admitting: Emergency Medicine

## 2015-12-20 ENCOUNTER — Emergency Department (HOSPITAL_COMMUNITY): Payer: Self-pay

## 2015-12-20 ENCOUNTER — Emergency Department (HOSPITAL_COMMUNITY)
Admission: EM | Admit: 2015-12-20 | Discharge: 2015-12-21 | Disposition: A | Discharge status: Home / Self Care | Payer: Self-pay | Attending: Emergency Medicine | Admitting: Emergency Medicine

## 2015-12-20 DIAGNOSIS — R110 Nausea: Secondary | ICD-10-CM | POA: Insufficient documentation

## 2015-12-20 DIAGNOSIS — R519 Headache, unspecified: Secondary | ICD-10-CM

## 2015-12-20 DIAGNOSIS — H53149 Visual discomfort, unspecified: Secondary | ICD-10-CM | POA: Insufficient documentation

## 2015-12-20 DIAGNOSIS — R51 Headache: Secondary | ICD-10-CM | POA: Insufficient documentation

## 2015-12-20 DIAGNOSIS — R0602 Shortness of breath: Secondary | ICD-10-CM | POA: Insufficient documentation

## 2015-12-20 DIAGNOSIS — Z8709 Personal history of other diseases of the respiratory system: Secondary | ICD-10-CM | POA: Insufficient documentation

## 2015-12-20 DIAGNOSIS — R42 Dizziness and giddiness: Secondary | ICD-10-CM | POA: Insufficient documentation

## 2015-12-20 DIAGNOSIS — Z8719 Personal history of other diseases of the digestive system: Secondary | ICD-10-CM | POA: Insufficient documentation

## 2015-12-20 DIAGNOSIS — Z8679 Personal history of other diseases of the circulatory system: Secondary | ICD-10-CM | POA: Insufficient documentation

## 2015-12-20 DIAGNOSIS — F17210 Nicotine dependence, cigarettes, uncomplicated: Secondary | ICD-10-CM | POA: Insufficient documentation

## 2015-12-20 DIAGNOSIS — Z8659 Personal history of other mental and behavioral disorders: Secondary | ICD-10-CM | POA: Insufficient documentation

## 2015-12-20 DIAGNOSIS — R079 Chest pain, unspecified: Secondary | ICD-10-CM | POA: Insufficient documentation

## 2015-12-20 LAB — BASIC METABOLIC PANEL
Anion gap: 7 (ref 5–15)
BUN: 13 mg/dL (ref 6–20)
CHLORIDE: 108 mmol/L (ref 101–111)
CO2: 24 mmol/L (ref 22–32)
CREATININE: 1.08 mg/dL (ref 0.61–1.24)
Calcium: 9.8 mg/dL (ref 8.9–10.3)
GFR calc Af Amer: >60 mL/min (ref >60)
GFR calc non Af Amer: >60 mL/min (ref >60)
Glucose, Bld: 108 mg/dL — ABNORMAL HIGH (ref 65–99)
Potassium: 3.7 mmol/L (ref 3.5–5.1)
Sodium: 139 mmol/L (ref 135–145)

## 2015-12-20 LAB — CBC
HCT: 39.6 % (ref 39.0–52.0)
Hemoglobin: 13.9 g/dL (ref 13.0–17.0)
MCH: 32.7 pg (ref 26.0–34.0)
MCHC: 35.1 g/dL (ref 30.0–36.0)
MCV: 93.2 fL (ref 78.0–100.0)
PLATELETS: 188 10*3/uL (ref 150–400)
RBC: 4.25 MIL/uL (ref 4.22–5.81)
RDW: 12.3 % (ref 11.5–15.5)
WBC: 8.7 10*3/uL (ref 4.0–10.5)

## 2015-12-20 LAB — I-STAT TROPONIN, ED: Troponin i, poc: 0 ng/mL (ref 0.00–0.08)

## 2015-12-20 MED ORDER — KETOROLAC TROMETHAMINE 30 MG/ML IJ SOLN
30.0000 mg | Freq: Once | INTRAMUSCULAR | Status: AC
  Administered 2015-12-20: 30 mg via INTRAVENOUS
  Filled 2015-12-20: qty 1

## 2015-12-20 MED ORDER — PROCHLORPERAZINE EDISYLATE 5 MG/ML IJ SOLN
10.0000 mg | Freq: Once | INTRAMUSCULAR | Status: AC
  Administered 2015-12-20: 10 mg via INTRAVENOUS
  Filled 2015-12-20: qty 2

## 2015-12-20 MED ORDER — DIPHENHYDRAMINE HCL 50 MG/ML IJ SOLN: 12.5000 mg | Freq: Once | INTRAMUSCULAR | Status: DC

## 2015-12-20 MED ORDER — SODIUM CHLORIDE 0.9 % IV BOLUS (SEPSIS)
1000.0000 mL | Freq: Once | INTRAVENOUS | Status: AC
  Administered 2015-12-20: 1000 mL via INTRAVENOUS

## 2015-12-20 NOTE — ED Notes (Signed)
Pt states he was seen here last night for migraine headache and states his blood pressure was elevated  Pt states he has slept most of today but when he has been awake he just cannot seem to get comfortable  Pt states today around 5pm his chest started to bother him  Pt states he just doesn't feel right  Pt states he he has a burning in his left shoulder and a hallow feeling in his left chest

## 2015-12-20 NOTE — ED Notes (Signed)
Pt states that he has had a headache since yesterday that has worsened today. Hx of headaches in past. Alert and oriented. Neuro intact.

## 2015-12-20 NOTE — ED Notes (Signed)
Pt refuses benadryl. And sates allergy noted

## 2015-12-20 NOTE — Discharge Instructions (Signed)

## 2015-12-20 NOTE — ED Provider Notes (Signed)
CSN: [id number]     Arrival date & time 12/20/15  0147 History  By signing my name below, I, Marisue HumbleMichelle Chaffee, attest that this documentation has been prepared under the direction and in the presence of Shon Batonourtney F Hernandez Losasso, MD . Electronically Signed: Marisue HumbleMichelle Chaffee, Scribe. 12/20/2015. 2:31 AM.    Chief Complaint  Patient presents with  . Headache   The history is provided by the patient. No language interpreter was used.   HPI Comments:  Henry Stanley is a 40 y.o. male with PMHx of cocaine abuse, seizures, depression, and anxiety who presents to the Emergency Department complaining of constant, worsening, 9/10 headache on sides of head onset ~1600 yesterday. Pt reports associated dizziness, photophobia, and nausea. He notes h/o migraines and states this feels similar to past migraines. Pt took extra strength Tylenol and Aleve without relief. He admits to using an unknown amount of cocaine; Last use several days ago. Pt denies fever or recent illnesses. Denies chest pain or shortness of breath.  Past Medical History  Diagnosis Date  . Cocaine abuse   . Depression   . Seizures (HCC)   . Polysubstance abuse   . Sinus infection   . Anxiety   . GERD (gastroesophageal reflux disease)    Past Surgical History  Procedure Laterality Date  . Femur surgery     History reviewed. No pertinent family history. Social History  Substance Use Topics  . Smoking status: Current Every Day Smoker -- 0.50 packs/day    Types: Cigarettes  . Smokeless tobacco: None  . Alcohol Use: Yes     Comment: does not drink on daily bases but has binges    Review of Systems  Constitutional: Negative for fever.  Eyes: Positive for photophobia.  Respiratory: Negative for shortness of breath.   Cardiovascular: Negative for chest pain.  Gastrointestinal: Positive for nausea. Negative for vomiting.  Musculoskeletal: Negative for neck pain and neck stiffness.  Neurological: Positive for dizziness and headaches.   All other systems reviewed and are negative.  Allergies  Sumatriptan; Acetaminophen; and Benadryl  Home Medications   Prior to Admission medications   Medication Sig Start Date End Date Taking? Authorizing Provider  acetaminophen (TYLENOL) 500 MG tablet Take 1,000 mg by mouth every 6 (six) hours as needed (for pain.).   Yes Historical Provider, MD  naproxen sodium (ANAPROX) 220 MG tablet Take 220 mg by mouth 2 (two) times daily as needed (for pain).   Yes Historical Provider, MD   BP 139/96 mmHg  Pulse 92  Temp(Src) 97.5 F (36.4 C) (Oral)  Resp 20  Ht 5\' 6"  (1.676 m)  Wt 160 lb (72.576 kg)  BMI 25.84 kg/m2  SpO2 99% Physical Exam  Constitutional: He is oriented to person, place, and time. He appears well-developed and well-nourished.  Uncomfortable appearing, lights off upon entering the room  HENT:  Head: Normocephalic and atraumatic.  Cardiovascular: Normal rate, regular rhythm and normal heart sounds.   No murmur heard. Pulmonary/Chest: Effort normal and breath sounds normal. No respiratory distress. He has no wheezes.  Musculoskeletal: He exhibits no edema.  Neurological: He is alert and oriented to person, place, and time.  Cranial nerves II through XII intact, 5 out of 5 strength in all 4 extremities  Skin: Skin is warm and dry.  Psychiatric: He has a normal mood and affect.  Nursing note and vitals reviewed.   ED Course  Procedures  DIAGNOSTIC STUDIES:  Oxygen Saturation is 99% on RA, normal by my interpretation.  COORDINATION OF CARE:  2:29 AM Will administer fluids, Compazine, Benadryl, and Toradol. Discussed treatment plan with pt at bedside and pt agreed to plan.  Labs Review Labs Reviewed - No data to display  Imaging Review No results found. I have personally reviewed and evaluated these images and lab results as part of my medical decision-making.   EKG Interpretation   Date/Time:  Monday December 20 2015 02:52:53 EDT Ventricular Rate:  95 PR  Interval:  174 QRS Duration: 89 QT Interval:  333 QTC Calculation: 419 R Axis:   88 Text Interpretation:  Sinus rhythm ST elev, probable normal early repol  pattern Confirmed by Rorik Vespa  MD, Anmol Paschen ([id number]) on 12/20/2015 2:56:40 AM      MDM   Final diagnoses:  Nonintractable headache, unspecified chronicity pattern, unspecified headache type    Patient presents with headache. History of same. Nontoxic. No red flags. Doubt meningitis or subarachnoid hemorrhage. He does have a history of cocaine abuse but states that his headaches do not coincide with abuse. Last use several days ago. Nonfocal on exam. Patient was given migraine cocktail. Given history of cocaine abuse, CT scan was ordered but patient refused. His blood pressure is 139/96. Doubt hypertensive urgency or emergency.  4:13 AM On recheck, he is resting comfortably. He states that the headache is gone away. Will discharge home.  After history, exam, and medical workup I feel the patient has been appropriately medically screened and is safe for discharge home. Pertinent diagnoses were discussed with the patient. Patient was given return precautions.  I personally performed the services described in this documentation, which was scribed in my presence. The recorded information has been reviewed and is accurate.    Shon Baton, MD 12/20/15 [phone]

## 2015-12-21 MED ORDER — LORAZEPAM 1 MG PO TABS
1.0000 mg | ORAL_TABLET | Freq: Once | ORAL | Status: AC
  Administered 2015-12-21: 1 mg via ORAL
  Filled 2015-12-21: qty 1

## 2015-12-21 NOTE — Discharge Instructions (Signed)
1. Medications: usual home medications 2. Treatment: rest, drink plenty of fluids 3. Follow Up: please followup with [facility] (go Thursday or Friday) for discussion of your diagnoses and further evaluation after today's visit; if you do not have a primary care doctor use the phone number listed in your discharge paperwork to find one; please return to the ER for severe chest pain, increased shortness of breath, new or worsening symptoms   Nonspecific Chest Pain It is often hard to find the cause of chest pain. There is always a chance that your pain could be related to something serious, such as a heart attack or a blood clot in your lungs. Chest pain can also be caused by conditions that are not life-threatening. If you have chest pain, it is very important to follow up with your doctor.  HOME CARE  If you were prescribed an antibiotic medicine, finish it all even if you start to feel better.  Avoid any activities that cause chest pain.  Do not use any tobacco products, including cigarettes, chewing tobacco, or electronic cigarettes. If you need help quitting, ask your doctor.  Do not drink alcohol.  Take medicines only as told by your doctor.  Keep all follow-up visits as told by your doctor. This is important. This includes any further testing if your chest pain does not go away.  Your doctor may tell you to keep your head raised (elevated) while you sleep.  Make lifestyle changes as told by your doctor. These may include:  Getting regular exercise. Ask your doctor to suggest some activities that are safe for you.  Eating a heart-healthy diet. Your doctor or a diet specialist (dietitian) can help you to learn healthy eating options.  Maintaining a healthy weight.  Managing diabetes, if necessary.  Reducing stress. GET HELP IF:  Your chest pain does not go away, even after treatment.  You have a rash with blisters on your chest.  You have a fever. GET  HELP RIGHT AWAY IF:  Your chest pain is worse.  You have an increasing cough, or you cough up blood.  You have severe belly (abdominal) pain.  You feel extremely weak.  You pass out (faint).  You have chills.  You have sudden, unexplained chest discomfort.  You have sudden, unexplained discomfort in your arms, back, neck, or jaw.  You have shortness of breath at any time.  You suddenly start to sweat, or your skin gets clammy.  You feel nauseous.  You vomit.  You suddenly feel light-headed or dizzy.  Your heart begins to beat quickly, or it feels like it is skipping beats. These symptoms may be an emergency. Do not wait to see if the symptoms will go away. Get medical help right away. Call your local emergency services (911 in the [place] Do not drive yourself to the hospital.   This information is not intended to replace advice given to you by your health care provider. Make sure you discuss any questions you have with your health care provider.   Document Released: 02/14/2008 Document Revised: 09/18/2014 Document Reviewed: 04/03/2014 [employer] Interactive Patient Education [employer]2016 [employer]

## 2015-12-21 NOTE — ED Provider Notes (Signed)
CSN: [id number]     Arrival date & time 12/20/15  2128 History   First MD Initiated Contact with Patient 12/21/15 0007     Chief Complaint  Patient presents with  . Chest Pain    HPI   Henry Stanley is a 40 y.o. male with a PMH of cocaine abuse, depression, anxiety, GERD who presents to the ED with left sided chest pain, which he states started this evening and has been intermittent. Currently, he denies symptoms. He reports mild associated shortness of breath. He reports sitting down for long periods of time exacerbates his pain. He states walking around improves his symptoms. He denies fever, chills, congestion, cough, abdominal pain. He notes nausea, though denies vomiting. He denies numbness, weakness, paresthesia. He denies recent travel or immobility, recent surgery, history of malignancy, history of DVT/PE, lower extremity edema.   Past Medical History  Diagnosis Date  . Cocaine abuse   . Depression   . Seizures (HCC)   . Polysubstance abuse   . Sinus infection   . Anxiety   . GERD (gastroesophageal reflux disease)    Past Surgical History  Procedure Laterality Date  . Femur surgery     History reviewed. No pertinent family history. Social History  Substance Use Topics  . Smoking status: Current Every Day Smoker -- 0.50 packs/day    Types: Cigarettes  . Smokeless tobacco: None  . Alcohol Use: Yes     Comment: does not drink on daily bases but has binges      Review of Systems  Constitutional: Negative for fever and chills.  HENT: Negative for congestion.   Respiratory: Positive for shortness of breath. Negative for cough.   Cardiovascular: Positive for chest pain. Negative for leg swelling.  Gastrointestinal: Positive for nausea. Negative for vomiting and abdominal pain.  Neurological: Negative for dizziness, syncope, weakness, light-headedness and numbness.  All other systems reviewed and are negative.     Allergies  Sumatriptan; Acetaminophen; and  Benadryl  Home Medications   Prior to Admission medications   Medication Sig Start Date End Date Taking? Authorizing Provider  acetaminophen (TYLENOL) 500 MG tablet Take 1,000 mg by mouth every 6 (six) hours as needed (for pain.).   Yes Historical Provider, MD  naproxen sodium (ANAPROX) 220 MG tablet Take 220 mg by mouth 2 (two) times daily as needed (for pain).   Yes Historical Provider, MD    BP 131/81 mmHg  Pulse 86  Temp(Src) 98.8 F (37.1 C) (Oral)  Resp 16  SpO2 97% Physical Exam  Constitutional: He is oriented to person, place, and time. He appears well-developed and well-nourished. No distress.  HENT:  Head: Normocephalic and atraumatic.  Right Ear: External ear normal.  Left Ear: External ear normal.  Nose: Nose normal.  Mouth/Throat: Uvula is midline, oropharynx is clear and moist and mucous membranes are normal.  Eyes: Conjunctivae, EOM and lids are normal. Pupils are equal, round, and reactive to light. Right eye exhibits no discharge. Left eye exhibits no discharge. No scleral icterus.  Neck: Normal range of motion. Neck supple.  Cardiovascular: Normal rate, regular rhythm, normal heart sounds, intact distal pulses and normal pulses.   Pulmonary/Chest: Effort normal and breath sounds normal. No respiratory distress. He has no wheezes. He has no rales. He exhibits no tenderness.  Abdominal: Soft. Normal appearance and bowel sounds are normal. He exhibits no distension and no mass. There is no tenderness. There is no rigidity, no rebound and no guarding.  Musculoskeletal: Normal  range of motion. He exhibits no edema or tenderness.  Neurological: He is alert and oriented to person, place, and time. He has normal strength. No sensory deficit.  Skin: Skin is warm, dry and intact. No rash noted. He is not diaphoretic. No erythema. No pallor.  Psychiatric: He has a normal mood and affect. His speech is normal and behavior is normal.  Nursing note and vitals reviewed.   ED  Course  Procedures (including critical care time)  Labs Review Labs Reviewed  BASIC METABOLIC PANEL - Abnormal; Notable for the following:    Glucose, Bld 108 (*)    All other components within normal limits  CBC  I-STAT TROPOININ, ED    Imaging Review Dg Chest 2 View  12/20/2015  CLINICAL DATA:  Acute onset of left shoulder burning and hollow feeling in left side of the chest. Initial encounter. EXAM: CHEST  2 VIEW COMPARISON:  Chest radiograph performed 11/14/2014 FINDINGS: The lungs are well-aerated and clear. There is no evidence of focal opacification, pleural effusion or pneumothorax. The heart is normal in size; the mediastinal contour is within normal limits. No acute osseous abnormalities are seen. IMPRESSION: No acute cardiopulmonary process seen. Electronically Signed   By: Roanna Raider M.D.   On: 12/20/2015 22:22   I have personally reviewed and evaluated these images and lab results as part of my medical decision-making.   EKG Interpretation   Date/Time:  Monday December 20 2015 21:57:11 EDT Ventricular Rate:  73 PR Interval:  204 QRS Duration: 87 QT Interval:  364 QTC Calculation: 401 R Axis:   95 Text Interpretation:  Sinus rhythm Borderline prolonged PR interval  Borderline right axis deviation Probable anteroseptal infarct, old ST  elevation, consider inferior injury No significant change since last  tracing Confirmed by LITTLE MD, RACHEL [phone]) on 12/20/2015 10:02:48 PM      MDM   Final diagnoses:  Chest pain, unspecified chest pain type    40 year old male presents with chest pain, which he states started earlier this evening prior to arrival. Notes mild shortness of breath, an states he feels like he cannot get comfortable. Denies exertional component, and states his symptoms improve when he gets up to walk. Denies recent travel or immobility, recent surgery, history of malignancy, history of DVT/PE, lower extremity edema. Patient has a PMH significant for  cocaine use, though states his last use was 2 days ago.  Patient is afebrile. Vital signs stable. Heart regular rate and rhythm. Lungs clear to auscultation bilaterally. Abdomen soft, nontender, nondistended. No lower extremity edema.  EKG sinus rhythm, heart rate 73, no significant change from prior. Troponin negative. CBC negative for leukocytosis or anemia. BMP unremarkable. Chest x-ray negative for acute cardiopulmonary process.  Patient given ativan. Case discussed with Dr. Preston Fleeting. Heart score 1. Low suspicion for ACS. No significant risk factors for PE. Patient is non-toxic and well-appearing, feel he is stable for discharge at this time. Patient to follow-up with PCP. Return precautions discussed. Patient verbalizes his understanding and is in agreement with plan.  BP 131/81 mmHg  Pulse 86  Temp(Src) 98.8 F (37.1 C) (Oral)  Resp 16  SpO2 97%    Mady Gemma, PA-C 12/21/15 0138  Dione Booze, MD 12/21/15 [phone]

## 2016-01-04 ENCOUNTER — Encounter (HOSPITAL_COMMUNITY): Payer: Self-pay | Admitting: *Deleted

## 2016-01-04 ENCOUNTER — Emergency Department (HOSPITAL_COMMUNITY)
Admission: EM | Admit: 2016-01-04 | Discharge: 2016-01-04 | Disposition: A | Discharge status: Home / Self Care | Payer: Self-pay | Attending: Emergency Medicine | Admitting: Emergency Medicine

## 2016-01-04 ENCOUNTER — Emergency Department (HOSPITAL_COMMUNITY): Payer: Self-pay

## 2016-01-04 DIAGNOSIS — R0602 Shortness of breath: Secondary | ICD-10-CM | POA: Insufficient documentation

## 2016-01-04 DIAGNOSIS — R079 Chest pain, unspecified: Secondary | ICD-10-CM | POA: Insufficient documentation

## 2016-01-04 DIAGNOSIS — F17210 Nicotine dependence, cigarettes, uncomplicated: Secondary | ICD-10-CM | POA: Insufficient documentation

## 2016-01-04 LAB — BASIC METABOLIC PANEL
Anion gap: 8 (ref 5–15)
BUN: 15 mg/dL (ref 6–20)
CHLORIDE: 104 mmol/L (ref 101–111)
CO2: 26 mmol/L (ref 22–32)
Calcium: 10 mg/dL (ref 8.9–10.3)
Creatinine, Ser: 1 mg/dL (ref 0.61–1.24)
Glucose, Bld: 107 mg/dL — ABNORMAL HIGH (ref 65–99)
POTASSIUM: 4.3 mmol/L (ref 3.5–5.1)
Sodium: 138 mmol/L (ref 135–145)

## 2016-01-04 LAB — CBC
HCT: 40 % (ref 39.0–52.0)
Hemoglobin: 13.9 g/dL (ref 13.0–17.0)
MCH: 32 pg (ref 26.0–34.0)
MCHC: 34.8 g/dL (ref 30.0–36.0)
MCV: 92 fL (ref 78.0–100.0)
Platelets: 205 K/uL (ref 150–400)
RBC: 4.35 MIL/uL (ref 4.22–5.81)
RDW: 12.3 % (ref 11.5–15.5)
WBC: 7.4 K/uL (ref 4.0–10.5)

## 2016-01-04 MED ORDER — OXYCODONE-ACETAMINOPHEN 5-325 MG PO TABS: 1.0000 | ORAL_TABLET | Freq: Once | ORAL | Status: DC

## 2016-01-04 NOTE — ED Notes (Signed)
Pt reports L cp radiating to his back which started last night.  Pt reports SOB but denies any n/v.  States pain is worse when laying down.  He states he was seen for same and it was d/t cocaine use.  Pt reports last use was 2 nights ago.  Pt describes cp as sharp and tight.

## 2016-01-04 NOTE — ED Notes (Signed)
Pt not found in lobby x 3 

## 2016-01-30 ENCOUNTER — Emergency Department (HOSPITAL_COMMUNITY): Payer: Self-pay

## 2016-01-30 ENCOUNTER — Emergency Department (HOSPITAL_COMMUNITY)
Admission: EM | Admit: 2016-01-30 | Discharge: 2016-01-31 | Disposition: A | Discharge status: Home / Self Care | Payer: Self-pay | Attending: Dermatology | Admitting: Dermatology

## 2016-01-30 ENCOUNTER — Encounter (HOSPITAL_COMMUNITY): Payer: Self-pay | Admitting: Family Medicine

## 2016-01-30 DIAGNOSIS — R0789 Other chest pain: Secondary | ICD-10-CM | POA: Insufficient documentation

## 2016-01-30 DIAGNOSIS — R51 Headache: Secondary | ICD-10-CM | POA: Insufficient documentation

## 2016-01-30 NOTE — ED Notes (Signed)
Pt is complaining of mid-sternal chest pain that radiates to his back. Pain started this morning. Also, pt is complaining of left sided headache that started this evening about 15:00. Pt reports taking GOODY POWDER about 20 minutes ago.

## 2016-01-31 LAB — BASIC METABOLIC PANEL
ANION GAP: 5 (ref 5–15)
BUN: 17 mg/dL (ref 6–20)
CHLORIDE: 106 mmol/L (ref 101–111)
CO2: 28 mmol/L (ref 22–32)
Calcium: 9.5 mg/dL (ref 8.9–10.3)
Creatinine, Ser: 1.2 mg/dL (ref 0.61–1.24)
GFR calc Af Amer: >60 mL/min (ref >60)
GLUCOSE: 96 mg/dL (ref 65–99)
POTASSIUM: 4.1 mmol/L (ref 3.5–5.1)
Sodium: 139 mmol/L (ref 135–145)

## 2016-01-31 LAB — I-STAT TROPONIN, ED: Troponin i, poc: 0 ng/mL (ref 0.00–0.08)

## 2016-01-31 LAB — CBC
HEMATOCRIT: 39.9 % (ref 39.0–52.0)
HEMOGLOBIN: 13.6 g/dL (ref 13.0–17.0)
MCH: 32.3 pg (ref 26.0–34.0)
MCHC: 34.1 g/dL (ref 30.0–36.0)
MCV: 94.8 fL (ref 78.0–100.0)
Platelets: 176 10*3/uL (ref 150–400)
RBC: 4.21 MIL/uL — ABNORMAL LOW (ref 4.22–5.81)
RDW: 12.5 % (ref 11.5–15.5)
WBC: 7.8 10*3/uL (ref 4.0–10.5)

## 2016-01-31 NOTE — ED Notes (Signed)
Did not respond when called 

## 2016-03-08 ENCOUNTER — Encounter (HOSPITAL_COMMUNITY): Payer: Self-pay | Admitting: Emergency Medicine

## 2016-03-08 ENCOUNTER — Emergency Department (HOSPITAL_COMMUNITY)
Admission: EM | Admit: 2016-03-08 | Discharge: 2016-03-09 | Disposition: A | Discharge status: Home / Self Care | Payer: Self-pay | Attending: Dermatology | Admitting: Dermatology

## 2016-03-08 DIAGNOSIS — Z5321 Procedure and treatment not carried out due to patient leaving prior to being seen by health care provider: Secondary | ICD-10-CM | POA: Insufficient documentation

## 2016-03-08 NOTE — ED Notes (Addendum)
Pt states that he has had a migraine headache since 4pm and tingling in his face. States he is normally prone to headaches/migraines. States that he took two goodie powders and feels better than he did now. Alert and oriented. Neuro intact.

## 2016-03-09 NOTE — ED Notes (Signed)
Called x 1 w/o answer.  

## 2016-03-09 NOTE — ED Notes (Signed)
Called x 2 w/o answer 

## 2016-10-27 ENCOUNTER — Encounter (HOSPITAL_COMMUNITY): Payer: Self-pay | Admitting: Emergency Medicine

## 2016-10-27 ENCOUNTER — Emergency Department (HOSPITAL_COMMUNITY)
Admission: EM | Admit: 2016-10-27 | Discharge: 2016-10-27 | Discharge status: Against Medical Advice | Payer: Self-pay | Attending: Emergency Medicine | Admitting: Emergency Medicine

## 2016-10-27 ENCOUNTER — Emergency Department (HOSPITAL_COMMUNITY): Payer: Self-pay

## 2016-10-27 DIAGNOSIS — F17210 Nicotine dependence, cigarettes, uncomplicated: Secondary | ICD-10-CM | POA: Insufficient documentation

## 2016-10-27 DIAGNOSIS — F1410 Cocaine abuse, uncomplicated: Secondary | ICD-10-CM | POA: Insufficient documentation

## 2016-10-27 DIAGNOSIS — Z7982 Long term (current) use of aspirin: Secondary | ICD-10-CM | POA: Insufficient documentation

## 2016-10-27 LAB — CBC WITH DIFFERENTIAL/PLATELET
Basophils Absolute: 0.1 10*3/uL (ref 0.0–0.1)
Basophils Relative: 1 %
EOS ABS: 0.3 10*3/uL (ref 0.0–0.7)
Eosinophils Relative: 5 %
HCT: 40.2 % (ref 39.0–52.0)
HEMOGLOBIN: 13.8 g/dL (ref 13.0–17.0)
LYMPHS ABS: 1.7 10*3/uL (ref 0.7–4.0)
Lymphocytes Relative: 25 %
MCH: 32.7 pg (ref 26.0–34.0)
MCHC: 34.3 g/dL (ref 30.0–36.0)
MCV: 95.3 fL (ref 78.0–100.0)
MONO ABS: 0.5 10*3/uL (ref 0.1–1.0)
MONOS PCT: 8 %
NEUTROS PCT: 61 %
Neutro Abs: 4.1 10*3/uL (ref 1.7–7.7)
Platelets: 192 10*3/uL (ref 150–400)
RBC: 4.22 MIL/uL (ref 4.22–5.81)
RDW: 12.7 % (ref 11.5–15.5)
WBC: 6.7 10*3/uL (ref 4.0–10.5)

## 2016-10-27 LAB — TROPONIN I

## 2016-10-27 LAB — BASIC METABOLIC PANEL
Anion gap: 6 (ref 5–15)
BUN: 17 mg/dL (ref 6–20)
CALCIUM: 9.8 mg/dL (ref 8.9–10.3)
CHLORIDE: 108 mmol/L (ref 101–111)
CO2: 26 mmol/L (ref 22–32)
CREATININE: 1.15 mg/dL (ref 0.61–1.24)
GFR calc Af Amer: >60 mL/min (ref >60)
GFR calc non Af Amer: >60 mL/min (ref >60)
Glucose, Bld: 116 mg/dL — ABNORMAL HIGH (ref 65–99)
Potassium: 4.1 mmol/L (ref 3.5–5.1)
Sodium: 140 mmol/L (ref 135–145)

## 2016-10-27 NOTE — ED Notes (Signed)
Pt advised of risks associated with leaving facility without proper treatment. Pt anxious and states he is ready to go.

## 2016-10-27 NOTE — ED Notes (Signed)
Pt placed back on monitor.

## 2016-10-27 NOTE — ED Notes (Signed)
Pt left AMA °

## 2016-10-27 NOTE — ED Triage Notes (Addendum)
Pt here for evaluation after ingesting 0.5g cocaine; pt stated his heart felt like it was beating fast; reports a slight burning sensation in chest; describes it as "a funny feeling"; states this doesn't feel like being high

## 2016-10-27 NOTE — ED Provider Notes (Signed)
[facility]-EMERGENCY DEPT Provider Note   CSN: [id number] Arrival date & time: 10/27/16  0203  By signing my name below, I, Rosario AdieWilliam Andrew Hiatt, attest that this documentation has been prepared under the direction and in the presence of Lakeita Panther, MD. Electronically Signed: Rosario AdieWilliam Andrew Hiatt, ED Scribe. 10/27/16. 3:55 AM.  History   Chief Complaint Chief Complaint  Patient presents with  . Ingested Cocaine   The history is provided by the patient. No language interpreter was used.  Palpitations   This is a new problem. The current episode started 1 to 2 hours ago. The problem occurs constantly. The problem has been gradually improving. The problem is associated with cocaine. Pertinent negatives include no diaphoresis, no fever, no numbness, no chest pain, no chest pressure, no nausea, no vomiting, no headaches, no dizziness, no weakness, no cough and no shortness of breath. He has tried nothing for the symptoms. The treatment provided no relief. Risk factors include being male. His past medical history does not include anemia or hyperthyroidism.   HPI Comments: Henry Stanley is a 41 y.o. male with a h/o cocaine abuse, GERD, and prior drug ingestion, who presents to the Emergency Department complaining of , sudden onset, gradually improving sensation of palpiations beginning earlier tonight prior to arrival. Pt reports that he placed 0.5g of cocaine into a plastic pill piece surrounded by half of a napkin and ingested this. He has previously ingested cocaine per nasal, but he has never ingested it orally or or in the amount of 0.5g. On interview, he states that his symptoms have mostly subsided and he is towards his baseline. No noted treatments for his symptoms were tried prior to coming into the ED. He denies chest pain, shortness of breath, fever, nausea, vomiting, or any other associated symptoms.   Past Medical History:  Diagnosis Date  . Anxiety   . Cocaine abuse   . Depression   .  GERD (gastroesophageal reflux disease)   . Polysubstance abuse   . Seizures (HCC)   . Sinus infection    Patient Active Problem List   Diagnosis Date Noted  . Drug ingestion 06/23/2014  . Polysubstance abuse 01/18/2014  . Benzodiazepine withdrawal (HCC) 01/18/2014  . Chest pain 01/18/2014  . Paresthesia 01/18/2014  . Acute respiratory failure (HCC) 12/16/2012  . Cocaine abuse 12/16/2012  . Depression 12/16/2012  . Perirectal abscess 09/22/2011   Past Surgical History:  Procedure Laterality Date  . FEMUR SURGERY      Home Medications    Prior to Admission medications   Medication Sig Start Date End Date Taking? Authorizing Provider  acetaminophen (TYLENOL) 500 MG tablet Take 1,000 mg by mouth every 6 (six) hours as needed (for pain.).    Historical Provider, MD  Aspirin-Acetaminophen-Caffeine (GOODY HEADACHE PO) Take 1-2 packets by mouth daily as needed (pain).    Historical Provider, MD  naproxen sodium (ANAPROX) 220 MG tablet Take 220-440 mg by mouth 2 (two) times daily as needed (for pain).     Historical Provider, MD   Family History No family history on file.  Social History Social History  Substance Use Topics  . Smoking status: Current Every Day Smoker    Packs/day: 0.50    Types: Cigarettes  . Smokeless tobacco: Not on file  . Alcohol use Yes     Comment: 2 times a week.    Allergies   Sumatriptan; Acetaminophen; and Benadryl [diphenhydramine hcl]  Review of Systems Review of Systems  Constitutional: Negative for diaphoresis  and fever.  Respiratory: Negative for cough and shortness of breath.   Cardiovascular: Positive for palpitations. Negative for chest pain and leg swelling.  Gastrointestinal: Negative for nausea and vomiting.  Neurological: Negative for dizziness, seizures, facial asymmetry, speech difficulty, weakness, light-headedness, numbness and headaches.  Psychiatric/Behavioral: Negative for self-injury, sleep disturbance and suicidal ideas.    All other systems reviewed and are negative.  Physical Exam Updated Vital Signs BP 151/100 (BP Location: Left Arm)   Pulse 97   Temp 98 F (36.7 C) (Oral)   Resp 17   SpO2 100%   Physical Exam  Constitutional: He is oriented to person, place, and time. He appears well-developed and well-nourished.  HENT:  Head: Normocephalic and atraumatic.  Right Ear: External ear normal.  Left Ear: External ear normal.  Nose: Nose normal.  Eyes: Conjunctivae and EOM are normal. Pupils are equal, round, and reactive to light. Right eye exhibits no discharge. Left eye exhibits no discharge.  Neck: Normal range of motion. Neck supple. No JVD present.  Cardiovascular: Normal rate, regular rhythm and normal heart sounds.  Exam reveals no gallop and no friction rub.   No murmur heard. RRR.   Pulmonary/Chest: Effort normal and breath sounds normal. No stridor. No respiratory distress. He has no wheezes. He has no rales.  Abdominal: Soft. He exhibits no mass. There is no tenderness. There is no rebound and no guarding.  Musculoskeletal: Normal range of motion. He exhibits no edema or tenderness.  Lymphadenopathy:    He has no cervical adenopathy.  Neurological: He is alert and oriented to person, place, and time. He displays normal reflexes. No cranial nerve deficit. He exhibits normal muscle tone. Coordination normal.  Skin: Skin is warm and dry. No rash noted. He is not diaphoretic. No erythema. No pallor.  Psychiatric: He has a normal mood and affect. His behavior is normal. He expresses no homicidal and no suicidal ideation. He expresses no suicidal plans and no homicidal plans.  Nursing note and vitals reviewed.  ED Treatments / Results  DIAGNOSTIC STUDIES: Oxygen Saturation is 100% on RA, normal by my interpretation.   COORDINATION OF CARE: 3:41 AM-Discussed next steps with pt. Pt verbalized understanding and is agreeable with the plan.   Results for orders placed or performed during the  hospital encounter of 01/30/16  Basic metabolic panel  Result Value Ref Range   Sodium 139 135 - 145 mmol/L   Potassium 4.1 3.5 - 5.1 mmol/L   Chloride 106 101 - 111 mmol/L   CO2 28 22 - 32 mmol/L   Glucose, Bld 96 65 - 99 mg/dL   BUN 17 6 - 20 mg/dL   Creatinine, Ser 1.61 0.61 - 1.24 mg/dL   Calcium 9.5 8.9 - 09.6 mg/dL   GFR calc non Af Amer >60 >60 mL/min   GFR calc Af Amer >60 >60 mL/min   Anion gap 5 5 - 15  CBC  Result Value Ref Range   WBC 7.8 4.0 - 10.5 K/uL   RBC 4.21 (L) 4.22 - 5.81 MIL/uL   Hemoglobin 13.6 13.0 - 17.0 g/dL   HCT 04.5 40.9 - 81.1 %   MCV 94.8 78.0 - 100.0 fL   MCH 32.3 26.0 - 34.0 pg   MCHC 34.1 30.0 - 36.0 g/dL   RDW 91.4 78.2 - 95.6 %   Platelets 176 150 - 400 K/uL  I-stat troponin, ED  Result Value Ref Range   Troponin i, poc 0.00 0.00 - 0.08 ng/mL  Comment 3           No results found.   EKG Interpretation  Date/Time:  Friday October 27 2016 02:06:16 EST Ventricular Rate:  98 PR Interval:    QRS Duration: 87 QT Interval:  339 QTC Calculation: 433 R Axis:   66 Text Interpretation:  Sinus rhythm Confirmed by [facility]  MD, Morene Antu ([id number]) on 10/27/2016 2:10:39 AM       Procedures Procedures   Medications Ordered in ED Given IVF in the ED    3:42 AM-I have seen and evaluated Henry Stanley. He is currently awake, alert, and oriented and has requested to leave the emergency department against medial advice. I have explained that we have no completed out evaluation and treatment and that the risks of leaving may include the worsening of his condition, additional pain or disability, the need for further and more invasive medical acre, and death, risks that we are unable to predict at this time due to the incomplete evaltuation. I discussed the above with the patient who has the capacity to understand and understood the discussion and is, without force or coercion, still requesting to leave. Patient was made aware that he can return to  the ED at any time without prejudice. The patient was given warning, medications were prescribed as needed, and follow-up was given when appropriate. Patient was further advised to seek follow-up care as directed or with their own personal provider, or the provider of their choosing.    Final Clinical Impressions(s) / ED Diagnoses  Cocaine abuse:  Signed AMA following our discussion.  Patient is welcome to return at any time   I personally performed the services described in this documentation, which was scribed in my presence. The recorded information has been reviewed and is accurate.      Cy Blamer, MD 10/27/16 [phone]

## 2016-10-27 NOTE — ED Notes (Signed)
Pt was educated on risks of leaving AMA such as death, stroke, MI.  Pt was also informed by Dr. Nicanor AlconPalumbo.

## 2016-10-27 NOTE — ED Notes (Signed)
Poison Control Recommendations Monitor for HTN, Tachycardia, Agitation, Possible Seizures Observe for 6 hours Give Fluids Cardiac Monitoring

## 2016-12-05 IMAGING — CR DG CHEST 2V
2 series · 2 of 2 positions shown · non-contrast
Comparison: Most recent radiographs 01/04/2016

CLINICAL DATA: Chest pain and headache, onset today.

EXAM:
CHEST  2 VIEW

[w chest pa]
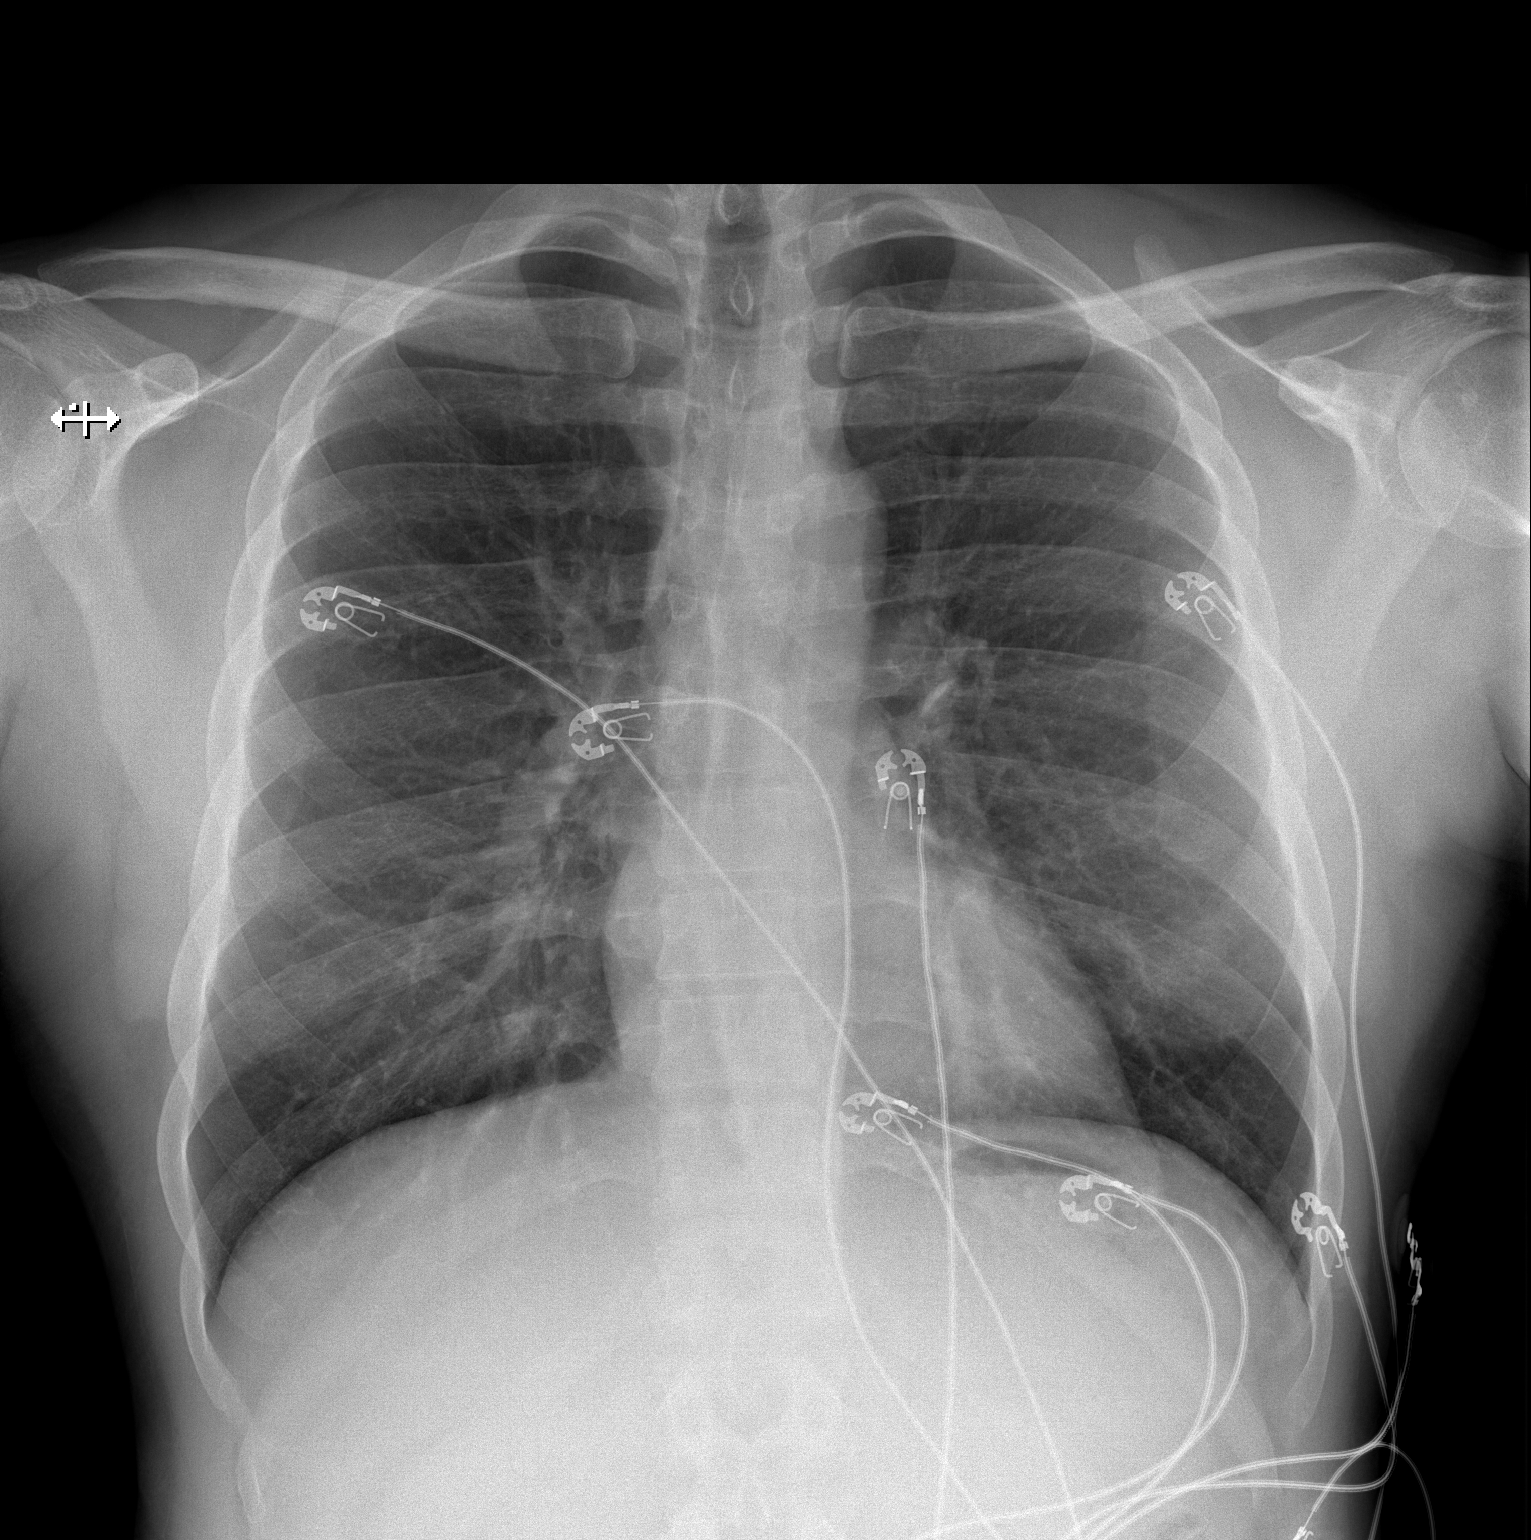

[w chest lat]
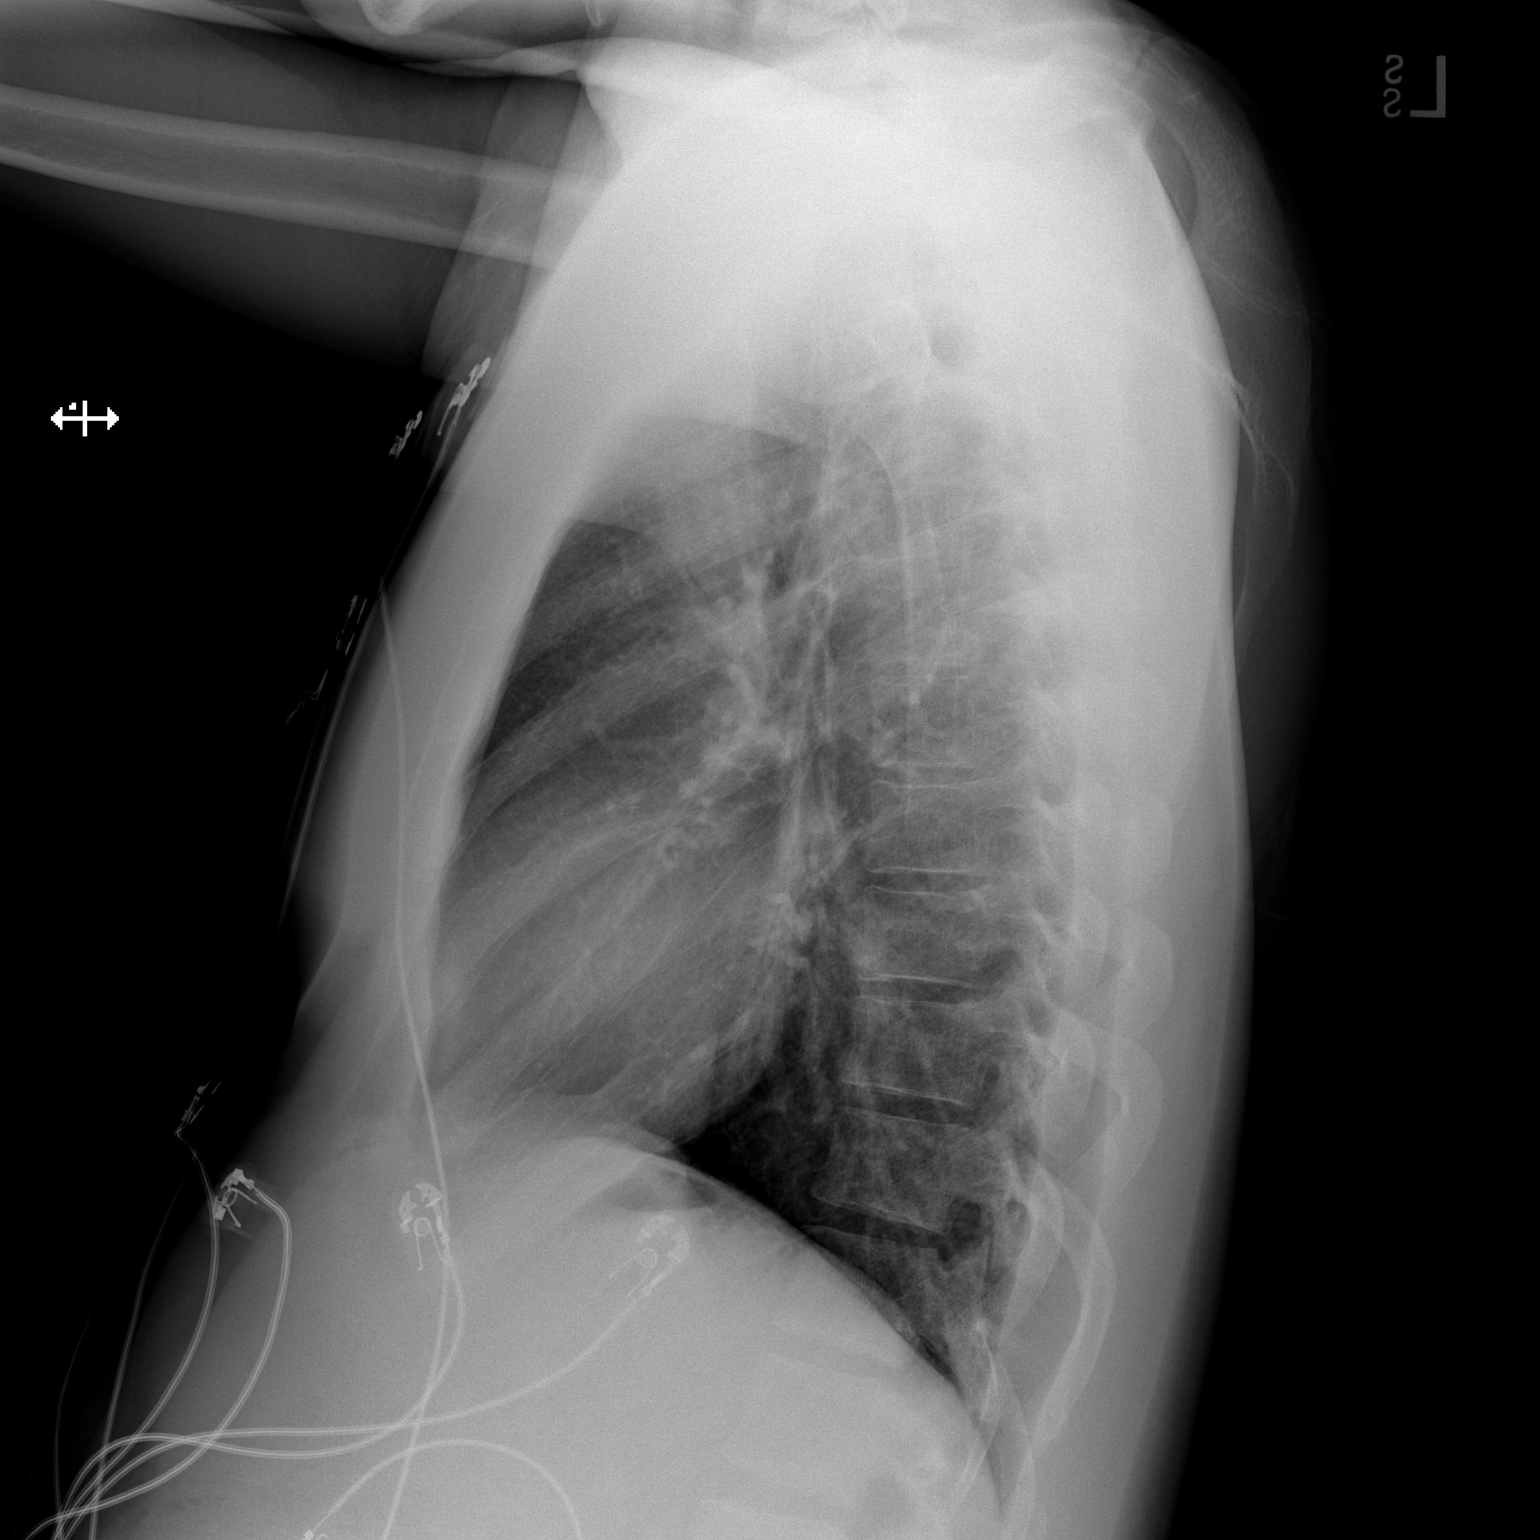

[2 of 2 positions shown; findings below may reference images not displayed]

FINDINGS: The cardiomediastinal contours are normal. The lungs are clear.
Pulmonary vasculature is normal. No consolidation, pleural effusion,
or pneumothorax. No acute osseous abnormalities are seen.
IMPRESSION: No acute pulmonary process.

## 2017-02-19 ENCOUNTER — Emergency Department (HOSPITAL_COMMUNITY)
Admission: EM | Admit: 2017-02-19 | Discharge: 2017-02-19 | Disposition: A | Discharge status: Home / Self Care | Payer: Self-pay | Attending: Emergency Medicine | Admitting: Emergency Medicine

## 2017-02-19 ENCOUNTER — Encounter (HOSPITAL_COMMUNITY): Payer: Self-pay

## 2017-02-19 DIAGNOSIS — F1410 Cocaine abuse, uncomplicated: Secondary | ICD-10-CM | POA: Insufficient documentation

## 2017-02-19 DIAGNOSIS — F17210 Nicotine dependence, cigarettes, uncomplicated: Secondary | ICD-10-CM | POA: Insufficient documentation

## 2017-02-19 DIAGNOSIS — Z5181 Encounter for therapeutic drug level monitoring: Secondary | ICD-10-CM | POA: Insufficient documentation

## 2017-02-19 LAB — BASIC METABOLIC PANEL
ANION GAP: 6 (ref 5–15)
BUN: 11 mg/dL (ref 6–20)
CALCIUM: 9.7 mg/dL (ref 8.9–10.3)
CO2: 27 mmol/L (ref 22–32)
Chloride: 106 mmol/L (ref 101–111)
Creatinine, Ser: 1.24 mg/dL (ref 0.61–1.24)
GFR calc Af Amer: >60 mL/min (ref >60)
GFR calc non Af Amer: >60 mL/min (ref >60)
Glucose, Bld: 116 mg/dL — ABNORMAL HIGH (ref 65–99)
POTASSIUM: 4 mmol/L (ref 3.5–5.1)
Sodium: 139 mmol/L (ref 135–145)

## 2017-02-19 LAB — CBC WITH DIFFERENTIAL/PLATELET
BASOS ABS: 0 10*3/uL (ref 0.0–0.1)
Basophils Relative: 0 %
Eosinophils Absolute: 0.2 10*3/uL (ref 0.0–0.7)
Eosinophils Relative: 3 %
HEMATOCRIT: 41.1 % (ref 39.0–52.0)
Hemoglobin: 13.9 g/dL (ref 13.0–17.0)
LYMPHS ABS: 2.8 10*3/uL (ref 0.7–4.0)
LYMPHS PCT: 44 %
MCH: 32.7 pg (ref 26.0–34.0)
MCHC: 33.8 g/dL (ref 30.0–36.0)
MCV: 96.7 fL (ref 78.0–100.0)
MONO ABS: 0.5 10*3/uL (ref 0.1–1.0)
Monocytes Relative: 9 %
NEUTROS ABS: 2.8 10*3/uL (ref 1.7–7.7)
Neutrophils Relative %: 44 %
Platelets: 175 10*3/uL (ref 150–400)
RBC: 4.25 MIL/uL (ref 4.22–5.81)
RDW: 11.9 % (ref 11.5–15.5)
WBC: 6.3 10*3/uL (ref 4.0–10.5)

## 2017-02-19 LAB — PROTIME-INR
INR: 0.87
Prothrombin Time: 11.8 seconds (ref 11.4–15.2)

## 2017-02-19 MED ORDER — ACTIDOSE WITH SORBITOL 50 GM/240ML PO LIQD
50.0000 g | Freq: Once | ORAL | Status: AC
Start: 2017-02-19 — End: 2017-02-19
  Administered 2017-02-19: 50 g via ORAL
  Filled 2017-02-19: qty 240

## 2017-02-19 NOTE — ED Triage Notes (Signed)
Pt states he swallowed a bag of cocaine, approximately 3g. He reports he has tried to vomit the bag back up but cannot. He reports some discomfort in his chest. No acute distress noted.

## 2017-02-19 NOTE — ED Notes (Signed)
Pt verbalized understanding of d/c instructions and has no further questions. Pt stable and NAD. VSS.  
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
## 2017-02-19 NOTE — ED Notes (Signed)
Pt ambulated to the restroom independently 

## 2017-02-19 NOTE — Discharge Instructions (Signed)
Call poison control if symptoms change or worsen - [phone]

## 2017-04-06 ENCOUNTER — Ambulatory Visit (HOSPITAL_COMMUNITY)
Admission: RE | Admit: 2017-04-06 | Discharge: 2017-04-06 | Disposition: A | Discharge status: Home / Self Care | Payer: Self-pay | Attending: Psychiatry | Admitting: Psychiatry

## 2017-04-06 NOTE — BH Assessment (Signed)
[facility] Assessment Progress Note  [facility], requesting detox from cocaine, per his intake form. No SI, HI, AVH indicated. Due to staffing constraints, pt had to wait to be seen over an hour. He was informed of the delay. Clinician was informed that pt left from the waiting room, even after being encouraged to stay by the receptionist.   Johny ShockSamantha M. Ladona Ridgelaylor, MS, NCC, LPCA Counselor

## 2017-04-06 NOTE — H&P (Signed)
Behavioral Health Medical Screening Exam  Sharlene MottsKywan D Korpela is an 41 y.o. male [facility] left prior to initial intake assessment. This provider was neither able to see nor assess this patient before he left.    Psychiatric Specialty Exam: Physical Exam UTA  ROS: UTA    Delila PereyraJustina A Okonkwo, NP 04/06/2017, 3:04 PM

## 2017-07-13 ENCOUNTER — Encounter (HOSPITAL_COMMUNITY): Payer: Self-pay

## 2017-07-13 DIAGNOSIS — Z5321 Procedure and treatment not carried out due to patient leaving prior to being seen by health care provider: Secondary | ICD-10-CM | POA: Insufficient documentation

## 2017-07-13 DIAGNOSIS — R1010 Upper abdominal pain, unspecified: Secondary | ICD-10-CM | POA: Insufficient documentation

## 2017-07-13 LAB — URINALYSIS, ROUTINE W REFLEX MICROSCOPIC
Bilirubin Urine: NEGATIVE
GLUCOSE, UA: NEGATIVE mg/dL
Hgb urine dipstick: NEGATIVE
KETONES UR: NEGATIVE mg/dL
Leukocytes, UA: NEGATIVE
Nitrite: NEGATIVE
PROTEIN: 30 mg/dL — AB
SQUAMOUS EPITHELIAL / LPF: NONE SEEN
Specific Gravity, Urine: 1.03 (ref 1.005–1.030)
pH: 6 (ref 5.0–8.0)

## 2017-07-13 LAB — COMPREHENSIVE METABOLIC PANEL
ALK PHOS: 68 U/L (ref 38–126)
ALT: 14 U/L — AB (ref 17–63)
AST: 20 U/L (ref 15–41)
Albumin: 4.2 g/dL (ref 3.5–5.0)
Anion gap: 9 (ref 5–15)
BUN: 14 mg/dL (ref 6–20)
CALCIUM: 9.5 mg/dL (ref 8.9–10.3)
CHLORIDE: 100 mmol/L — AB (ref 101–111)
CO2: 24 mmol/L (ref 22–32)
CREATININE: 1.27 mg/dL — AB (ref 0.61–1.24)
GFR calc Af Amer: >60 mL/min (ref >60)
GFR calc non Af Amer: >60 mL/min (ref >60)
GLUCOSE: 103 mg/dL — AB (ref 65–99)
Potassium: 3.8 mmol/L (ref 3.5–5.1)
SODIUM: 133 mmol/L — AB (ref 135–145)
Total Bilirubin: 0.8 mg/dL (ref 0.3–1.2)
Total Protein: 7.4 g/dL (ref 6.5–8.1)

## 2017-07-13 LAB — CBC
HCT: 40.3 % (ref 39.0–52.0)
HEMOGLOBIN: 13.5 g/dL (ref 13.0–17.0)
MCH: 32.1 pg (ref 26.0–34.0)
MCHC: 33.5 g/dL (ref 30.0–36.0)
MCV: 96 fL (ref 78.0–100.0)
PLATELETS: 196 10*3/uL (ref 150–400)
RBC: 4.2 MIL/uL — ABNORMAL LOW (ref 4.22–5.81)
RDW: 12 % (ref 11.5–15.5)
WBC: 6.7 10*3/uL (ref 4.0–10.5)

## 2017-07-13 LAB — LIPASE, BLOOD: LIPASE: 40 U/L (ref 11–51)

## 2017-07-13 NOTE — ED Triage Notes (Signed)
Pt endorses lumbar pain and upper abd pain that began 1.5 hours ago. Denies n/v/d, urinary sx, or injury. VSS.

## 2017-07-14 ENCOUNTER — Emergency Department (HOSPITAL_COMMUNITY)
Admission: EM | Admit: 2017-07-14 | Discharge: 2017-07-14 | Disposition: A | Discharge status: Home / Self Care | Payer: Self-pay | Attending: Emergency Medicine | Admitting: Emergency Medicine

## 2017-07-14 NOTE — ED Notes (Signed)
Called name x3 to be roomed. No response. 

## 2017-07-14 NOTE — ED Notes (Signed)
Writer called for reassessing vitals, no response 

## 2017-07-31 ENCOUNTER — Other Ambulatory Visit: Payer: Self-pay

## 2017-07-31 ENCOUNTER — Emergency Department (HOSPITAL_COMMUNITY)
Admission: EM | Admit: 2017-07-31 | Discharge: 2017-07-31 | Disposition: A | Discharge status: Home / Self Care | Payer: Self-pay | Attending: Emergency Medicine | Admitting: Emergency Medicine

## 2017-07-31 ENCOUNTER — Encounter (HOSPITAL_COMMUNITY): Payer: Self-pay | Admitting: Emergency Medicine

## 2017-07-31 ENCOUNTER — Emergency Department (HOSPITAL_COMMUNITY): Payer: Self-pay

## 2017-07-31 DIAGNOSIS — F1410 Cocaine abuse, uncomplicated: Secondary | ICD-10-CM | POA: Insufficient documentation

## 2017-07-31 DIAGNOSIS — F1210 Cannabis abuse, uncomplicated: Secondary | ICD-10-CM | POA: Insufficient documentation

## 2017-07-31 DIAGNOSIS — R0981 Nasal congestion: Secondary | ICD-10-CM | POA: Insufficient documentation

## 2017-07-31 DIAGNOSIS — F1390 Sedative, hypnotic, or anxiolytic use, unspecified, uncomplicated: Secondary | ICD-10-CM | POA: Insufficient documentation

## 2017-07-31 DIAGNOSIS — F17210 Nicotine dependence, cigarettes, uncomplicated: Secondary | ICD-10-CM | POA: Insufficient documentation

## 2017-07-31 DIAGNOSIS — J309 Allergic rhinitis, unspecified: Secondary | ICD-10-CM | POA: Insufficient documentation

## 2017-07-31 MED ORDER — CETIRIZINE HCL 10 MG PO TABS: 10.0000 mg | ORAL_TABLET | Freq: Every day | ORAL | 0 refills | Status: DC

## 2017-07-31 MED ORDER — FLUTICASONE PROPIONATE 50 MCG/ACT NA SUSP: 1.0000 | Freq: Every day | NASAL | 0 refills | Status: DC

## 2017-07-31 MED ORDER — GI COCKTAIL ~~LOC~~
30.0000 mL | Freq: Once | ORAL | Status: AC
  Administered 2017-07-31: 30 mL via ORAL
  Filled 2017-07-31: qty 30

## 2017-07-31 NOTE — ED Provider Notes (Signed)
[facility]-EMERGENCY DEPT Provider Note   CSN: [id number] Arrival date & time: 07/31/17  1922     History   Chief Complaint Chief Complaint  Patient presents with  . Cough  . Nasal Congestion    HPI Henry Stanley is a 41 y.o. male at presenting with 3373-month history of cough and congestion.  Patient states that for the past 2 months, he has had persistent cough and congestion.  Cough is nonproductive.  It is worse at night.  He has been taking TheraFlu and a friend's antibiotic without improvement of symptoms.  Patient reports worsening symptoms after he smokes cigarettes or snorts cocaine.  He denies sick contacts.  He denies history of similar.  He denies history of seasonal allergies.  He denies fevers, chills, sinus pressure, sore throat, chest pain, nausea, vomiting, abdominal pain, urinary symptoms, abnormal bowel movements.  He denies leg pain or swelling.  He states that today he tried some raw garlic to help with his symptoms, and this caused some heartburn symptoms.   HPI  Past Medical History:  Diagnosis Date  . Anxiety   . Cocaine abuse (HCC)   . Depression   . GERD (gastroesophageal reflux disease)   . Polysubstance abuse (HCC)   . Seizures (HCC)   . Sinus infection     Patient Active Problem List   Diagnosis Date Noted  . Drug ingestion 06/23/2014  . Polysubstance abuse (HCC) 01/18/2014  . Benzodiazepine withdrawal (HCC) 01/18/2014  . Chest pain 01/18/2014  . Paresthesia 01/18/2014  . Acute respiratory failure (HCC) 12/16/2012  . Cocaine abuse (HCC) 12/16/2012  . Depression 12/16/2012  . Perirectal abscess 09/22/2011    Past Surgical History:  Procedure Laterality Date  . FEMUR SURGERY         Home Medications    Prior to Admission medications   Medication Sig Start Date End Date Taking? Authorizing Provider  cetirizine (EQ ALLERGY RELIEF, CETIRIZINE,) 10 MG tablet Take 1 tablet (10 mg total) by mouth daily. 07/31/17    Rosalie Gelpi, PA-C  fluticasone (FLONASE) 50 MCG/ACT nasal spray Place 1 spray into both nostrils daily. 07/31/17   Carnell Beavers, PA-C  naproxen sodium (ALEVE) 220 MG tablet Take 220-440 mg by mouth 2 (two) times daily as needed (pain/headache).    [provider]    Family History History reviewed. No pertinent family history.  Social History Social History   Tobacco Use  . Smoking status: Current Every Day Smoker    Packs/day: 0.50    Types: Cigarettes  . Smokeless tobacco: Never Used  Substance Use Topics  . Alcohol use: Yes    Comment: 2 times a week.   . Drug use: Yes    Types: Cocaine, Marijuana, Benzodiazepines    Comment: last used 07/12/17     Allergies   Sumatriptan; Acetaminophen; and Benadryl [diphenhydramine hcl]   Review of Systems Review of Systems  Constitutional: Negative for chills and fever.  HENT: Positive for congestion. Negative for sore throat.   Respiratory: Positive for cough. Negative for chest tightness and shortness of breath.   Cardiovascular: Negative for chest pain and leg swelling.     Physical Exam Updated Vital Signs BP (!) 146/94   Pulse 92   Temp 98.1 F (36.7 C) (Oral)   Resp 18   Ht 5\' 6"  (1.676 m)   Wt 72.6 kg (160 lb)   SpO2 100%   BMI 25.82 kg/m   Physical Exam  Constitutional: He is oriented  to person, place, and time. He appears well-developed and well-nourished. No distress.  HENT:  Head: Normocephalic and atraumatic.  Right Ear: Tympanic membrane, external ear and ear canal normal.  Left Ear: Tympanic membrane, external ear and ear canal normal.  Nose: Mucosal edema present. No septal deviation or nasal septal hematoma. No epistaxis.  No foreign bodies. Right sinus exhibits no maxillary sinus tenderness and no frontal sinus tenderness. Left sinus exhibits no maxillary sinus tenderness and no frontal sinus tenderness.  Mouth/Throat: Uvula is midline, oropharynx is clear and moist and mucous  membranes are normal. No tonsillar exudate.  Nasal mucosal edema, R > L  Eyes: Conjunctivae and EOM are normal. Pupils are equal, round, and reactive to light.  Neck: Normal range of motion.  Cardiovascular: Normal rate, regular rhythm and intact distal pulses.  Pulmonary/Chest: Effort normal and breath sounds normal. He has no decreased breath sounds. He has no wheezes. He has no rhonchi. He has no rales.  Pt speaking in full sentences without difficulty. Clear lung sounds in all fields  Abdominal: Soft. He exhibits no distension. There is no tenderness.  Musculoskeletal: Normal range of motion.  Lymphadenopathy:    He has no cervical adenopathy.  Neurological: He is alert and oriented to person, place, and time.  Skin: Skin is warm.  Psychiatric: He has a normal mood and affect.  Nursing note and vitals reviewed.    ED Treatments / Results  Labs (all labs ordered are listed, but only abnormal results are displayed) Labs Reviewed - No data to display  EKG  EKG Interpretation None       Radiology Dg Chest 2 View  Result Date: 07/31/2017 CLINICAL DATA:  Cough for 2 months.  Occasional chest pain.  Smoker. EXAM: CHEST  2 VIEW COMPARISON:  10/27/2016 FINDINGS: The heart size and mediastinal contours are within normal limits. Both lungs are clear. The visualized skeletal structures are unremarkable. IMPRESSION: No active cardiopulmonary disease. Electronically Signed   By: Burman NievesWilliam  Stevens M.D.   On: 07/31/2017 21:05    Procedures Procedures (including critical care time)  Medications Ordered in ED Medications  gi cocktail (Maalox,Lidocaine,Donnatal) (30 mLs Oral Given 07/31/17 2115)     Initial Impression / Assessment and Plan / ED Course  I have reviewed the triage vital signs and the nursing notes.  Pertinent labs & imaging results that were available during my care of the patient were reviewed by me and considered in my medical decision making (see chart for  details).     Patient presenting for evaluation of nasal congestion cough for 2 months.  Physical exam is reassuring, he is afebrile not tachycardic.  Benign lung exam.  Significant nasal mucosal edema.  No sinus pain or pressure.  Likely allergies.  Will treat with Flonase and antihistamines.  GI cocktail given for heartburn symptoms with improvement.  Patient to follow-up with [place] and wellness as needed.  Discussed importance of quitting smoking and stopping cocaine use. At this time, patient appears safe for discharge.  Return precautions given.  Patient states he understands and agrees to plan.   Final Clinical Impressions(s) / ED Diagnoses   Final diagnoses:  Allergic rhinitis, unspecified seasonality, unspecified trigger    ED Discharge Orders        Ordered    fluticasone (FLONASE) 50 MCG/ACT nasal spray  Daily     07/31/17 2132    cetirizine (EQ ALLERGY RELIEF, CETIRIZINE,) 10 MG tablet  Daily     07/31/17 2132  Alveria Apley, PA-C 08/01/17 4010    Mancel Bale, MD 08/03/17 [phone]

## 2017-07-31 NOTE — Discharge Instructions (Signed)
Use Flonase daily for nasal congestion and swelling. Take cetirizine daily. It is important that you stay well-hydrated with water. It is important that you decrease your smoking and cocaine use, as this will cause persistence of symptoms. Follow-up with [place] and wellness if symptoms are not improving. Return to the emergency room if you develop fevers, chills, chest pain, or any new or worsening symptoms.

## 2017-07-31 NOTE — ED Triage Notes (Signed)
Pt states that he has had nasal congestion and dry cough x 2 months.  Denies any other symptoms.

## 2017-09-02 ENCOUNTER — Emergency Department (HOSPITAL_COMMUNITY)
Admission: EM | Admit: 2017-09-02 | Discharge: 2017-09-03 | Discharge status: Against Medical Advice | Payer: Self-pay | Attending: Emergency Medicine | Admitting: Emergency Medicine

## 2017-09-02 ENCOUNTER — Encounter (HOSPITAL_COMMUNITY): Payer: Self-pay | Admitting: Emergency Medicine

## 2017-09-02 DIAGNOSIS — Z5321 Procedure and treatment not carried out due to patient leaving prior to being seen by health care provider: Secondary | ICD-10-CM | POA: Insufficient documentation

## 2017-09-02 LAB — RAPID STREP SCREEN (MED CTR MEBANE ONLY): STREPTOCOCCUS, GROUP A SCREEN (DIRECT): NEGATIVE

## 2017-09-02 NOTE — ED Notes (Signed)
Called for pt x2 no answer

## 2017-09-02 NOTE — ED Notes (Signed)
Pt called for room. No response from lobby 

## 2017-09-02 NOTE — ED Triage Notes (Signed)
Patient c/o sore throat, cough, and congestion since this morning.

## 2017-09-05 LAB — CULTURE, GROUP A STREP (THRC)

## 2017-10-25 ENCOUNTER — Emergency Department (HOSPITAL_COMMUNITY)
Admission: EM | Admit: 2017-10-25 | Discharge: 2017-10-25 | Disposition: A | Discharge status: Home / Self Care | Payer: Self-pay | Attending: Emergency Medicine | Admitting: Emergency Medicine

## 2017-10-25 ENCOUNTER — Emergency Department (HOSPITAL_COMMUNITY): Payer: Self-pay

## 2017-10-25 ENCOUNTER — Encounter (HOSPITAL_COMMUNITY): Payer: Self-pay | Admitting: Emergency Medicine

## 2017-10-25 ENCOUNTER — Encounter (HOSPITAL_COMMUNITY): Payer: Self-pay

## 2017-10-25 DIAGNOSIS — R4182 Altered mental status, unspecified: Secondary | ICD-10-CM | POA: Insufficient documentation

## 2017-10-25 DIAGNOSIS — Y92488 Other paved roadways as the place of occurrence of the external cause: Secondary | ICD-10-CM | POA: Insufficient documentation

## 2017-10-25 DIAGNOSIS — T148XXA Other injury of unspecified body region, initial encounter: Secondary | ICD-10-CM

## 2017-10-25 DIAGNOSIS — Y999 Unspecified external cause status: Secondary | ICD-10-CM | POA: Insufficient documentation

## 2017-10-25 DIAGNOSIS — S0081XA Abrasion of other part of head, initial encounter: Secondary | ICD-10-CM | POA: Insufficient documentation

## 2017-10-25 DIAGNOSIS — Y939 Activity, unspecified: Secondary | ICD-10-CM | POA: Insufficient documentation

## 2017-10-25 DIAGNOSIS — S5012XA Contusion of left forearm, initial encounter: Secondary | ICD-10-CM | POA: Insufficient documentation

## 2017-10-25 DIAGNOSIS — S61412A Laceration without foreign body of left hand, initial encounter: Secondary | ICD-10-CM | POA: Insufficient documentation

## 2017-10-25 DIAGNOSIS — Z23 Encounter for immunization: Secondary | ICD-10-CM | POA: Insufficient documentation

## 2017-10-25 DIAGNOSIS — T07XXXA Unspecified multiple injuries, initial encounter: Secondary | ICD-10-CM | POA: Insufficient documentation

## 2017-10-25 DIAGNOSIS — F1910 Other psychoactive substance abuse, uncomplicated: Secondary | ICD-10-CM | POA: Insufficient documentation

## 2017-10-25 LAB — BPAM RBC
BLOOD PRODUCT EXPIRATION DATE: 201902202359
BLOOD PRODUCT EXPIRATION DATE: 201902202359
ISSUE DATE / TIME: 201902140007
ISSUE DATE / TIME: 201902140007
UNIT TYPE AND RH: 9500
Unit Type and Rh: 9500

## 2017-10-25 LAB — COMPREHENSIVE METABOLIC PANEL
ALBUMIN: 4.6 g/dL (ref 3.5–5.0)
ALT: 25 U/L (ref 17–63)
ANION GAP: 12 (ref 5–15)
AST: 24 U/L (ref 15–41)
Alkaline Phosphatase: 65 U/L (ref 38–126)
BUN: 7 mg/dL (ref 6–20)
CHLORIDE: 107 mmol/L (ref 101–111)
CO2: 21 mmol/L — AB (ref 22–32)
Calcium: 9.6 mg/dL (ref 8.9–10.3)
Creatinine, Ser: 1.18 mg/dL (ref 0.61–1.24)
GFR calc non Af Amer: 36 mL/min — ABNORMAL LOW (ref >60)
GFR, EST AFRICAN AMERICAN: 41 mL/min — AB (ref >60)
Glucose, Bld: 133 mg/dL — ABNORMAL HIGH (ref 65–99)
Potassium: 3.7 mmol/L (ref 3.5–5.1)
SODIUM: 140 mmol/L (ref 135–145)
Total Bilirubin: 0.5 mg/dL (ref 0.3–1.2)
Total Protein: 7.3 g/dL (ref 6.5–8.1)

## 2017-10-25 LAB — TYPE AND SCREEN
ABO/RH(D): AB POS
Antibody Screen: NEGATIVE
UNIT DIVISION: 0
Unit division: 0

## 2017-10-25 LAB — PROTIME-INR
INR: 0.84
PROTHROMBIN TIME: 11.4 s (ref 11.4–15.2)

## 2017-10-25 LAB — URINALYSIS, ROUTINE W REFLEX MICROSCOPIC
BILIRUBIN URINE: NEGATIVE
GLUCOSE, UA: NEGATIVE mg/dL
HGB URINE DIPSTICK: NEGATIVE
KETONES UR: NEGATIVE mg/dL
Leukocytes, UA: NEGATIVE
NITRITE: NEGATIVE
PH: 6 (ref 5.0–8.0)
Protein, ur: NEGATIVE mg/dL
Specific Gravity, Urine: 1.004 — ABNORMAL LOW (ref 1.005–1.030)

## 2017-10-25 LAB — ABO/RH: ABO/RH(D): AB POS

## 2017-10-25 LAB — I-STAT CHEM 8, ED
BUN: 7 mg/dL (ref 6–20)
CALCIUM ION: 1.22 mmol/L (ref 1.15–1.40)
Chloride: 105 mmol/L (ref 101–111)
Creatinine, Ser: 1.2 mg/dL (ref 0.61–1.24)
GLUCOSE: 131 mg/dL — AB (ref 65–99)
HCT: 46 % (ref 39.0–52.0)
HEMOGLOBIN: 15.6 g/dL (ref 13.0–17.0)
POTASSIUM: 3.6 mmol/L (ref 3.5–5.1)
SODIUM: 143 mmol/L (ref 135–145)
TCO2: 25 mmol/L (ref 22–32)

## 2017-10-25 LAB — CBC
HEMATOCRIT: 42.5 % (ref 39.0–52.0)
HEMOGLOBIN: 14.5 g/dL (ref 13.0–17.0)
MCH: 33.1 pg (ref 26.0–34.0)
MCHC: 34.1 g/dL (ref 30.0–36.0)
MCV: 97 fL (ref 78.0–100.0)
Platelets: 196 10*3/uL (ref 150–400)
RBC: 4.38 MIL/uL (ref 4.22–5.81)
RDW: 11.8 % (ref 11.5–15.5)
WBC: 8.9 10*3/uL (ref 4.0–10.5)

## 2017-10-25 LAB — RAPID URINE DRUG SCREEN, HOSP PERFORMED
Amphetamines: NOT DETECTED
Barbiturates: NOT DETECTED
Benzodiazepines: NOT DETECTED
COCAINE: POSITIVE — AB
Opiates: NOT DETECTED
Tetrahydrocannabinol: POSITIVE — AB

## 2017-10-25 LAB — I-STAT CG4 LACTIC ACID, ED: Lactic Acid, Venous: 3.06 mmol/L (ref 0.5–1.9)

## 2017-10-25 LAB — ACETAMINOPHEN LEVEL

## 2017-10-25 LAB — SALICYLATE LEVEL

## 2017-10-25 LAB — CBG MONITORING, ED: Glucose-Capillary: 120 mg/dL — ABNORMAL HIGH (ref 65–99)

## 2017-10-25 LAB — CDS SEROLOGY

## 2017-10-25 LAB — ETHANOL: Alcohol, Ethyl (B): 120 mg/dL — ABNORMAL HIGH (ref <10)

## 2017-10-25 MED ORDER — NAPROXEN 375 MG PO TABS: 375.0000 mg | ORAL_TABLET | Freq: Two times a day (BID) | ORAL | 0 refills | Status: DC

## 2017-10-25 MED ORDER — SODIUM CHLORIDE 0.9 % IV SOLN
INTRAVENOUS | Status: DC
  Administered 2017-10-25: 04:00:00 via INTRAVENOUS

## 2017-10-25 MED ORDER — CLINDAMYCIN PHOSPHATE 600 MG/50ML IV SOLN
600.0000 mg | Freq: Once | INTRAVENOUS | Status: AC
  Administered 2017-10-25: 600 mg via INTRAVENOUS
  Filled 2017-10-25: qty 50

## 2017-10-25 MED ORDER — CEPHALEXIN 500 MG PO CAPS: 500.0000 mg | ORAL_CAPSULE | Freq: Four times a day (QID) | ORAL | 0 refills | Status: DC

## 2017-10-25 MED ORDER — IOPAMIDOL (ISOVUE-300) INJECTION 61%
INTRAVENOUS | Status: AC
  Administered 2017-10-25: 100 mL
  Filled 2017-10-25: qty 100

## 2017-10-25 MED ORDER — METHOCARBAMOL 500 MG PO TABS: 500.0000 mg | ORAL_TABLET | Freq: Two times a day (BID) | ORAL | 0 refills | Status: DC

## 2017-10-25 MED ORDER — KETOROLAC TROMETHAMINE 30 MG/ML IJ SOLN
30.0000 mg | Freq: Once | INTRAMUSCULAR | Status: AC
  Administered 2017-10-25: 30 mg via INTRAVENOUS
  Filled 2017-10-25: qty 1

## 2017-10-25 MED ORDER — SODIUM CHLORIDE 0.9 % IV BOLUS (SEPSIS)
1000.0000 mL | Freq: Once | INTRAVENOUS | Status: AC
  Administered 2017-10-25: 1000 mL via INTRAVENOUS

## 2017-10-25 MED ORDER — TETANUS-DIPHTH-ACELL PERTUSSIS 5-2.5-18.5 LF-MCG/0.5 IM SUSP
0.5000 mL | Freq: Once | INTRAMUSCULAR | Status: AC
  Administered 2017-10-25: 0.5 mL via INTRAMUSCULAR
  Filled 2017-10-25: qty 0.5
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
## 2017-10-26 LAB — PREPARE FRESH FROZEN PLASMA
UNIT DIVISION: 0
Unit division: 0

## 2017-10-26 LAB — BPAM FFP
BLOOD PRODUCT EXPIRATION DATE: 201902172359
Blood Product Expiration Date: 201902172359
ISSUE DATE / TIME: 201902140007
ISSUE DATE / TIME: 201902150024
Unit Type and Rh: 6200
Unit Type and Rh: 6200

## 2017-12-22 ENCOUNTER — Emergency Department (HOSPITAL_COMMUNITY)
Admission: EM | Admit: 2017-12-22 | Discharge: 2017-12-22 | Disposition: A | Discharge status: Home / Self Care | Payer: Self-pay | Attending: Emergency Medicine | Admitting: Emergency Medicine

## 2017-12-22 ENCOUNTER — Encounter (HOSPITAL_COMMUNITY): Payer: Self-pay | Admitting: Emergency Medicine

## 2017-12-22 ENCOUNTER — Other Ambulatory Visit: Payer: Self-pay

## 2017-12-22 DIAGNOSIS — Z5321 Procedure and treatment not carried out due to patient leaving prior to being seen by health care provider: Secondary | ICD-10-CM | POA: Insufficient documentation

## 2017-12-22 DIAGNOSIS — Z77098 Contact with and (suspected) exposure to other hazardous, chiefly nonmedicinal, chemicals: Secondary | ICD-10-CM | POA: Insufficient documentation
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
## 2018-09-27 ENCOUNTER — Emergency Department (HOSPITAL_COMMUNITY)
Admission: EM | Admit: 2018-09-27 | Discharge: 2018-09-27 | Discharge status: Against Medical Advice | Payer: Self-pay | Attending: Emergency Medicine | Admitting: Emergency Medicine

## 2018-09-27 ENCOUNTER — Encounter (HOSPITAL_COMMUNITY): Payer: Self-pay | Admitting: Emergency Medicine

## 2018-09-27 ENCOUNTER — Other Ambulatory Visit: Payer: Self-pay

## 2018-09-27 DIAGNOSIS — Z79899 Other long term (current) drug therapy: Secondary | ICD-10-CM | POA: Insufficient documentation

## 2018-09-27 DIAGNOSIS — F17210 Nicotine dependence, cigarettes, uncomplicated: Secondary | ICD-10-CM | POA: Insufficient documentation

## 2018-09-27 DIAGNOSIS — T50902A Poisoning by unspecified drugs, medicaments and biological substances, intentional self-harm, initial encounter: Secondary | ICD-10-CM

## 2018-09-27 DIAGNOSIS — T405X1A Poisoning by cocaine, accidental (unintentional), initial encounter: Secondary | ICD-10-CM | POA: Insufficient documentation

## 2018-09-27 MED ORDER — CHARCOAL ACTIVATED PO LIQD
75.0000 g | Freq: Once | ORAL | Status: AC
  Administered 2018-09-27: 75 g via ORAL
  Filled 2018-09-27: qty 480
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
## 2018-09-27 NOTE — ED Notes (Addendum)
Patient eloped between approximently 1332 and 1335. NT noticed the patient was no longer on the monitor and notified the RN. RN checked the room for the patient and he was no longer there. No personal belongings were found in the room. MD notified of the situation at 1335. All bathrooms were checked for the patient but he still could not be found. Staff waited a little to see if he would reappear, but he did not. Last vitals were unable to be re-assessed due to the patient's elopement. It was also noted that the majority of the charcoal was missing as well. Patient gave no indication of his plans to leave.
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
## 2018-11-12 ENCOUNTER — Ambulatory Visit (HOSPITAL_COMMUNITY)
Admission: EM | Admit: 2018-11-12 | Discharge: 2018-11-12 | Disposition: A | Discharge status: Home / Self Care | Payer: Self-pay | Attending: Family Medicine | Admitting: Family Medicine

## 2018-11-12 ENCOUNTER — Encounter (HOSPITAL_COMMUNITY): Payer: Self-pay | Admitting: Emergency Medicine

## 2018-11-12 ENCOUNTER — Other Ambulatory Visit: Payer: Self-pay

## 2018-11-12 DIAGNOSIS — J019 Acute sinusitis, unspecified: Secondary | ICD-10-CM

## 2018-11-12 MED ORDER — CETIRIZINE-PSEUDOEPHEDRINE ER 5-120 MG PO TB12: 1.0000 | ORAL_TABLET | Freq: Every day | ORAL | 0 refills | Status: DC

## 2018-11-12 MED ORDER — DEXAMETHASONE SODIUM PHOSPHATE 10 MG/ML IJ SOLN
INTRAMUSCULAR | Status: AC
  Filled 2018-11-12: qty 1

## 2018-11-12 MED ORDER — FLUTICASONE PROPIONATE 50 MCG/ACT NA SUSP: 1.0000 | Freq: Every day | NASAL | 0 refills | Status: DC

## 2018-11-12 MED ORDER — AMOXICILLIN-POT CLAVULANATE 875-125 MG PO TABS: 1.0000 | ORAL_TABLET | Freq: Two times a day (BID) | ORAL | 0 refills | Status: DC

## 2018-11-12 MED ORDER — DEXAMETHASONE SODIUM PHOSPHATE 10 MG/ML IJ SOLN
10.0000 mg | Freq: Once | INTRAMUSCULAR | Status: AC
  Administered 2018-11-12: 10 mg via INTRAMUSCULAR

## 2018-11-12 NOTE — ED Triage Notes (Signed)
Pt states he woke up this morning with left sided sinus pressure along with a left sided headache.  Pt states over the last three days he has had vomiting x1, sore throat and sinus pressure, but he woke up today with the headache.

## 2018-11-12 NOTE — Discharge Instructions (Signed)
We are treating you for a sinus infection Augmentin twice a day for the next 7 days Zyrtec-D for nasal congestion Flonase nasal spray for congestion and allergy type symptoms Steroid injection given here in clinic for sinus headache and nasal inflammation Follow up as needed for continued or worsening symptoms

## 2018-11-14 NOTE — ED Provider Notes (Signed)
[facility]    CSN: [id number] Arrival date & time: 11/12/18  1458     History   Chief Complaint Chief Complaint  Patient presents with  . Sinus Problem    HPI Henry Stanley is a 43 y.o. male.   Pt is a 43 year old male with hx of cocaine abuse, anxiety, depression, polysubstance abuse, sinus infection. He presents with 3 to 4 days of worsening sinus pressure, mucous, headache, ear pain,  sore throat and vomiting x 1. This is mostly located on the left side. He has had some chills and myalgias but unsure of fever. He has used alka seltzer sinus without relief of symptoms. Denies any cough, chest congestion.   ROS per HPI      Past Medical History:  Diagnosis Date  . Anxiety   . Cocaine abuse (HCC)   . Depression   . GERD (gastroesophageal reflux disease)   . Polysubstance abuse (HCC)   . Seizures (HCC)   . Sinus infection     Patient Active Problem List   Diagnosis Date Noted  . Drug ingestion 06/23/2014  . Polysubstance abuse (HCC) 01/18/2014  . Benzodiazepine withdrawal (HCC) 01/18/2014  . Chest pain 01/18/2014  . Paresthesia 01/18/2014  . Acute respiratory failure (HCC) 12/16/2012  . Cocaine abuse (HCC) 12/16/2012  . Depression 12/16/2012  . Perirectal abscess 09/22/2011    Past Surgical History:  Procedure Laterality Date  . FEMUR SURGERY         Home Medications    Prior to Admission medications   Medication Sig Start Date End Date Taking? Authorizing Provider  amoxicillin-clavulanate (AUGMENTIN) 875-125 MG tablet Take 1 tablet by mouth every 12 (twelve) hours. 11/12/18   Dahlia ByesBast, Raeanne Deschler A, NP  cetirizine (EQ ALLERGY RELIEF, CETIRIZINE,) 10 MG tablet Take 1 tablet (10 mg total) by mouth daily. Patient not taking: Reported on 09/27/2018 07/31/17   Caccavale, Sophia, PA-C  cetirizine-pseudoephedrine (ZYRTEC-D) 5-120 MG tablet Take 1 tablet by mouth daily. 11/12/18   Nedra Mcinnis, Gloris Manchesterraci A, NP  fluticasone (FLONASE) 50 MCG/ACT nasal spray Place 1 spray  into both nostrils daily. 11/12/18   Dahlia ByesBast, Jearldean Gutt A, NP  ibuprofen (ADVIL,MOTRIN) 200 MG tablet Take 400 mg by mouth every 6 (six) hours as needed for headache or mild pain.    [provider]  methocarbamol (ROBAXIN) 500 MG tablet Take 1 tablet (500 mg total) by mouth 2 (two) times daily. Patient not taking: Reported on 09/27/2018 10/25/17   Palumbo, April, MD  naproxen (NAPROSYN) 375 MG tablet Take 1 tablet (375 mg total) by mouth 2 (two) times daily with a meal. Patient not taking: Reported on 09/27/2018 10/25/17   Palumbo, April, MD  oxymetazoline (AFRIN) 0.05 % nasal spray Place 1 spray into both nostrils 2 (two) times daily as needed for congestion.    [provider]    Family History History reviewed. No pertinent family history.  Social History Social History   Tobacco Use  . Smoking status: Current Every Day Smoker    Packs/day: 0.50    Types: Cigarettes  . Smokeless tobacco: Current User  Substance Use Topics  . Alcohol use: Yes    Comment: 2 times a week.   . Drug use: Yes    Types: Cocaine, Marijuana, Benzodiazepines    Comment: last used 07/12/17     Allergies   Sumatriptan; Acetaminophen; and Benadryl [diphenhydramine hcl]   Review of Systems Review of Systems   Physical Exam Triage Vital Signs ED Triage Vitals  Enc Vitals Group     BP 11/12/18 1516 133/90     Pulse Rate 11/12/18 1516 95     Resp --      Temp 11/12/18 1516 98.2 F (36.8 C)     Temp Source 11/12/18 1516 Oral     SpO2 11/12/18 1516 97 %     Weight --      Height --      Head Circumference --      Peak Flow --      Pain Score 11/12/18 1517 9     Pain Loc --      Pain Edu? --      Excl. in GC? --    No data found.  Updated Vital Signs BP 133/90 (BP Location: Left Arm)   Pulse 95   Temp 98.2 F (36.8 C) (Oral)   SpO2 97%   Visual Acuity Right Eye Distance:   Left Eye Distance:   Bilateral Distance:    Right Eye Near:   Left Eye Near:    Bilateral Near:      Physical Exam Vitals signs and nursing note reviewed.  Constitutional:      General: He is not in acute distress.    Appearance: Normal appearance. He is not ill-appearing, toxic-appearing or diaphoretic.  HENT:     Head: Normocephalic and atraumatic.     Right Ear: Tympanic membrane and ear canal normal.     Left Ear: Tympanic membrane and ear canal normal.     Nose: Mucosal edema, congestion and rhinorrhea present. Rhinorrhea is purulent.     Right Turbinates: Swollen and pale.     Left Turbinates: Swollen and pale.     Right Sinus: Maxillary sinus tenderness and frontal sinus tenderness present.     Comments: Thick mucous.     Mouth/Throat:     Pharynx: Oropharynx is clear.  Eyes:     Conjunctiva/sclera: Conjunctivae normal.  Neck:     Musculoskeletal: Normal range of motion.  Cardiovascular:     Rate and Rhythm: Normal rate and regular rhythm.     Pulses: Normal pulses.     Heart sounds: Normal heart sounds.  Pulmonary:     Effort: Pulmonary effort is normal.     Breath sounds: Normal breath sounds.  Musculoskeletal: Normal range of motion.  Lymphadenopathy:     Cervical: Cervical adenopathy present.  Skin:    General: Skin is warm and dry.  Neurological:     Mental Status: He is alert.  Psychiatric:        Mood and Affect: Mood normal.      UC Treatments / Results  Labs (all labs ordered are listed, but only abnormal results are displayed) Labs Reviewed - No data to display  EKG None  Radiology No results found.  Procedures Procedures (including critical care time)  Medications Ordered in UC Medications  dexamethasone (DECADRON) injection 10 mg (10 mg Intramuscular Given 11/12/18 1605)    Initial Impression / Assessment and Plan / UC Course  I have reviewed the triage vital signs and the nursing notes.  Pertinent labs & imaging results that were available during my care of the patient were reviewed by me and considered in my medical decision making  (see chart for details).     Based on hx cocaine abuse, sinus infections and exam findings will go ahead and treat for sinus infection Decadron given in clinic for nasal swelling and inflammation. Augmentin 2 x day for 7 days.  Zyrtec D and Flonase for symptoms.  Follow up as needed for continued or worsening symptoms  Final Clinical Impressions(s) / UC Diagnoses   Final diagnoses:  Acute non-recurrent sinusitis, unspecified location     Discharge Instructions     We are treating you for a sinus infection Augmentin twice a day for the next 7 days Zyrtec-D for nasal congestion Flonase nasal spray for congestion and allergy type symptoms Steroid injection given here in clinic for sinus headache and nasal inflammation Follow up as needed for continued or worsening symptoms     ED Prescriptions    Medication Sig Dispense Auth. Provider   fluticasone (FLONASE) 50 MCG/ACT nasal spray Place 1 spray into both nostrils daily. 16 g Tarahji Ramthun A, NP   amoxicillin-clavulanate (AUGMENTIN) 875-125 MG tablet Take 1 tablet by mouth every 12 (twelve) hours. 14 tablet Jacelynn Hayton A, NP   cetirizine-pseudoephedrine (ZYRTEC-D) 5-120 MG tablet Take 1 tablet by mouth daily. 30 tablet Janace Aris, NP     Controlled Substance Prescriptions [place] Controlled Substance Registry consulted? no   Janace Aris, NP 11/14/18 [phone]

## 2019-06-26 ENCOUNTER — Inpatient Hospital Stay (HOSPITAL_COMMUNITY)
Admission: EM | Admit: 2019-06-26 | Discharge: 2019-06-30 | DRG: 871 | Disposition: A | Discharge status: Home / Self Care | Payer: Self-pay | Attending: Internal Medicine | Admitting: Internal Medicine

## 2019-06-26 ENCOUNTER — Encounter (HOSPITAL_COMMUNITY): Payer: Self-pay

## 2019-06-26 ENCOUNTER — Emergency Department (HOSPITAL_COMMUNITY): Payer: Self-pay

## 2019-06-26 DIAGNOSIS — I214 Non-ST elevation (NSTEMI) myocardial infarction: Secondary | ICD-10-CM | POA: Diagnosis present

## 2019-06-26 DIAGNOSIS — J181 Lobar pneumonia, unspecified organism: Secondary | ICD-10-CM | POA: Diagnosis present

## 2019-06-26 DIAGNOSIS — R7401 Elevation of levels of liver transaminase levels: Secondary | ICD-10-CM | POA: Diagnosis present

## 2019-06-26 DIAGNOSIS — I081 Rheumatic disorders of both mitral and tricuspid valves: Secondary | ICD-10-CM | POA: Diagnosis present

## 2019-06-26 DIAGNOSIS — M6282 Rhabdomyolysis: Secondary | ICD-10-CM | POA: Diagnosis present

## 2019-06-26 DIAGNOSIS — N182 Chronic kidney disease, stage 2 (mild): Secondary | ICD-10-CM | POA: Diagnosis present

## 2019-06-26 DIAGNOSIS — F1910 Other psychoactive substance abuse, uncomplicated: Secondary | ICD-10-CM | POA: Diagnosis present

## 2019-06-26 DIAGNOSIS — T510X1A Toxic effect of ethanol, accidental (unintentional), initial encounter: Secondary | ICD-10-CM | POA: Diagnosis present

## 2019-06-26 DIAGNOSIS — F1210 Cannabis abuse, uncomplicated: Secondary | ICD-10-CM | POA: Diagnosis present

## 2019-06-26 DIAGNOSIS — T50901A Poisoning by unspecified drugs, medicaments and biological substances, accidental (unintentional), initial encounter: Secondary | ICD-10-CM | POA: Diagnosis present

## 2019-06-26 DIAGNOSIS — D631 Anemia in chronic kidney disease: Secondary | ICD-10-CM | POA: Diagnosis present

## 2019-06-26 DIAGNOSIS — K7200 Acute and subacute hepatic failure without coma: Secondary | ICD-10-CM | POA: Diagnosis present

## 2019-06-26 DIAGNOSIS — T401X1A Poisoning by heroin, accidental (unintentional), initial encounter: Secondary | ICD-10-CM

## 2019-06-26 DIAGNOSIS — I5030 Unspecified diastolic (congestive) heart failure: Secondary | ICD-10-CM | POA: Diagnosis present

## 2019-06-26 DIAGNOSIS — T68XXXA Hypothermia, initial encounter: Secondary | ICD-10-CM | POA: Diagnosis present

## 2019-06-26 DIAGNOSIS — J189 Pneumonia, unspecified organism: Secondary | ICD-10-CM | POA: Diagnosis present

## 2019-06-26 DIAGNOSIS — F329 Major depressive disorder, single episode, unspecified: Secondary | ICD-10-CM | POA: Diagnosis present

## 2019-06-26 DIAGNOSIS — R778 Other specified abnormalities of plasma proteins: Secondary | ICD-10-CM | POA: Diagnosis present

## 2019-06-26 DIAGNOSIS — K219 Gastro-esophageal reflux disease without esophagitis: Secondary | ICD-10-CM | POA: Diagnosis present

## 2019-06-26 DIAGNOSIS — R680 Hypothermia, not associated with low environmental temperature: Secondary | ICD-10-CM | POA: Diagnosis present

## 2019-06-26 DIAGNOSIS — Y900 Blood alcohol level of less than 20 mg/100 ml: Secondary | ICD-10-CM | POA: Diagnosis present

## 2019-06-26 DIAGNOSIS — E875 Hyperkalemia: Secondary | ICD-10-CM | POA: Diagnosis present

## 2019-06-26 DIAGNOSIS — R7989 Other specified abnormal findings of blood chemistry: Secondary | ICD-10-CM | POA: Diagnosis present

## 2019-06-26 DIAGNOSIS — F419 Anxiety disorder, unspecified: Secondary | ICD-10-CM | POA: Diagnosis present

## 2019-06-26 DIAGNOSIS — F1410 Cocaine abuse, uncomplicated: Secondary | ICD-10-CM | POA: Diagnosis present

## 2019-06-26 DIAGNOSIS — R945 Abnormal results of liver function studies: Secondary | ICD-10-CM | POA: Diagnosis present

## 2019-06-26 DIAGNOSIS — Z791 Long term (current) use of non-steroidal anti-inflammatories (NSAID): Secondary | ICD-10-CM

## 2019-06-26 DIAGNOSIS — Z886 Allergy status to analgesic agent status: Secondary | ICD-10-CM

## 2019-06-26 DIAGNOSIS — N179 Acute kidney failure, unspecified: Secondary | ICD-10-CM | POA: Diagnosis present

## 2019-06-26 DIAGNOSIS — D6959 Other secondary thrombocytopenia: Secondary | ICD-10-CM | POA: Diagnosis present

## 2019-06-26 DIAGNOSIS — J9601 Acute respiratory failure with hypoxia: Secondary | ICD-10-CM | POA: Diagnosis present

## 2019-06-26 DIAGNOSIS — F17210 Nicotine dependence, cigarettes, uncomplicated: Secondary | ICD-10-CM | POA: Diagnosis present

## 2019-06-26 DIAGNOSIS — Z888 Allergy status to other drugs, medicaments and biological substances status: Secondary | ICD-10-CM

## 2019-06-26 DIAGNOSIS — Z7951 Long term (current) use of inhaled steroids: Secondary | ICD-10-CM

## 2019-06-26 DIAGNOSIS — T405X1A Poisoning by cocaine, accidental (unintentional), initial encounter: Secondary | ICD-10-CM | POA: Diagnosis present

## 2019-06-26 DIAGNOSIS — Z20828 Contact with and (suspected) exposure to other viral communicable diseases: Secondary | ICD-10-CM | POA: Diagnosis present

## 2019-06-26 DIAGNOSIS — I130 Hypertensive heart and chronic kidney disease with heart failure and stage 1 through stage 4 chronic kidney disease, or unspecified chronic kidney disease: Secondary | ICD-10-CM | POA: Diagnosis present

## 2019-06-26 DIAGNOSIS — E876 Hypokalemia: Secondary | ICD-10-CM | POA: Diagnosis not present

## 2019-06-26 DIAGNOSIS — F32A Depression, unspecified: Secondary | ICD-10-CM | POA: Diagnosis present

## 2019-06-26 DIAGNOSIS — Z79899 Other long term (current) drug therapy: Secondary | ICD-10-CM

## 2019-06-26 DIAGNOSIS — J9600 Acute respiratory failure, unspecified whether with hypoxia or hypercapnia: Secondary | ICD-10-CM | POA: Diagnosis present

## 2019-06-26 DIAGNOSIS — Z8249 Family history of ischemic heart disease and other diseases of the circulatory system: Secondary | ICD-10-CM

## 2019-06-26 DIAGNOSIS — A419 Sepsis, unspecified organism: Principal | ICD-10-CM | POA: Diagnosis present

## 2019-06-26 DIAGNOSIS — E872 Acidosis: Secondary | ICD-10-CM | POA: Diagnosis present

## 2019-06-26 DIAGNOSIS — R6520 Severe sepsis without septic shock: Secondary | ICD-10-CM | POA: Diagnosis present

## 2019-06-26 DIAGNOSIS — R0789 Other chest pain: Secondary | ICD-10-CM

## 2019-06-26 DIAGNOSIS — Z23 Encounter for immunization: Secondary | ICD-10-CM

## 2019-06-26 LAB — CBC
HCT: 48.6 % (ref 39.0–52.0)
Hemoglobin: 15.8 g/dL (ref 13.0–17.0)
MCH: 33.3 pg (ref 26.0–34.0)
MCHC: 32.5 g/dL (ref 30.0–36.0)
MCV: 102.5 fL — ABNORMAL HIGH (ref 80.0–100.0)
Platelets: 236 10*3/uL (ref 150–400)
RBC: 4.74 MIL/uL (ref 4.22–5.81)
RDW: 12.2 % (ref 11.5–15.5)
WBC: 13.4 10*3/uL — ABNORMAL HIGH (ref 4.0–10.5)
nRBC: 0 % (ref 0.0–0.2)

## 2019-06-26 LAB — COMPREHENSIVE METABOLIC PANEL
ALT: 68 U/L — ABNORMAL HIGH (ref 0–44)
AST: 99 U/L — ABNORMAL HIGH (ref 15–41)
Albumin: 5 g/dL (ref 3.5–5.0)
Alkaline Phosphatase: 74 U/L (ref 38–126)
Anion gap: 18 — ABNORMAL HIGH (ref 5–15)
BUN: 16 mg/dL (ref 6–20)
CO2: 21 mmol/L — ABNORMAL LOW (ref 22–32)
Calcium: 9.6 mg/dL (ref 8.9–10.3)
Chloride: 102 mmol/L (ref 98–111)
Creatinine, Ser: 2.8 mg/dL — ABNORMAL HIGH (ref 0.61–1.24)
GFR calc Af Amer: 31 mL/min — ABNORMAL LOW (ref >60)
GFR calc non Af Amer: 26 mL/min — ABNORMAL LOW (ref >60)
Glucose, Bld: 92 mg/dL (ref 70–99)
Potassium: 6.4 mmol/L (ref 3.5–5.1)
Sodium: 141 mmol/L (ref 135–145)
Total Bilirubin: 0.5 mg/dL (ref 0.3–1.2)
Total Protein: 8.4 g/dL — ABNORMAL HIGH (ref 6.5–8.1)

## 2019-06-26 LAB — ETHANOL: Alcohol, Ethyl (B): <10 mg/dL (ref <10)

## 2019-06-26 MED ORDER — SODIUM ZIRCONIUM CYCLOSILICATE 10 G PO PACK
10.0000 g | PACK | Freq: Once | ORAL | Status: AC
  Administered 2019-06-27: 10 g via ORAL
  Filled 2019-06-26: qty 1

## 2019-06-26 MED ORDER — LACTATED RINGERS IV BOLUS
1000.0000 mL | Freq: Once | INTRAVENOUS | Status: AC
  Administered 2019-06-26: 1000 mL via INTRAVENOUS

## 2019-06-26 MED ORDER — INSULIN ASPART 100 UNIT/ML IV SOLN
5.0000 [IU] | Freq: Once | INTRAVENOUS | Status: AC
  Administered 2019-06-27: 5 [IU] via INTRAVENOUS

## 2019-06-26 MED ORDER — DEXTROSE 50 % IV SOLN
1.0000 | Freq: Once | INTRAVENOUS | Status: AC
  Administered 2019-06-27: 50 mL via INTRAVENOUS
  Filled 2019-06-26: qty 50

## 2019-06-26 MED ORDER — NALOXONE HCL 0.4 MG/ML IJ SOLN
0.4000 mg | Freq: Once | INTRAMUSCULAR | Status: AC
  Administered 2019-06-26: 0.4 mg via INTRAVENOUS

## 2019-06-26 MED ORDER — ONDANSETRON HCL 4 MG/2ML IJ SOLN
4.0000 mg | Freq: Once | INTRAMUSCULAR | Status: AC
  Administered 2019-06-26: 22:00:00 4 mg via INTRAVENOUS
  Filled 2019-06-26: qty 2

## 2019-06-26 NOTE — ED Triage Notes (Signed)
Pt states that he snorted a gram of cocaine and heroin and drank a pint of liquor, pt is cool and diaphoretic. Pt oxygen drops to 84% on RA when he falls to sleep

## 2019-06-26 NOTE — ED Provider Notes (Signed)
[facility] EMERGENCY DEPARTMENT Provider Note   CSN: [id number] Arrival date & time: 06/26/19  2036     History   Chief Complaint Chief Complaint  Patient presents with  . Drug Overdose    HPI Myrick Mcnairy Mallis is a 43 y.o. male.     HPI  The patient is a 43 year old male with a history of polysubstance abuse who presents to the ED acutely intoxicated following reported cocaine and heroin ingestion. The patient stated to providers that her snorted around 1 gram of cocaine and heroin prior to arrival. He arrived somnolent but arousable.  Past Medical History:  Diagnosis Date  . Anxiety   . Cocaine abuse (HCC)   . Depression   . GERD (gastroesophageal reflux disease)   . Polysubstance abuse (HCC)   . Seizures (HCC)   . Sinus infection     Patient Active Problem List   Diagnosis Date Noted  . Drug overdose 06/27/2019  . Hyperkalemia 06/27/2019  . Elevated troponin 06/27/2019  . Leukocytosis 06/27/2019  . Abnormal LFTs 06/27/2019  . Hypothermia 06/27/2019  . Acute renal failure superimposed on stage 2 chronic kidney disease (HCC) 06/27/2019  . Lobar pneumonia (HCC) 06/27/2019  . Drug ingestion 06/23/2014  . Polysubstance abuse (HCC) 01/18/2014  . Benzodiazepine withdrawal (HCC) 01/18/2014  . Chest pain 01/18/2014  . Paresthesia 01/18/2014  . Acute respiratory failure (HCC) 12/16/2012  . Cocaine abuse (HCC) 12/16/2012  . Depression 12/16/2012  . Perirectal abscess 09/22/2011    Past Surgical History:  Procedure Laterality Date  . FEMUR SURGERY          Home Medications    Prior to Admission medications   Medication Sig Start Date End Date Taking? Authorizing Provider  acetaminophen (TYLENOL) 500 MG tablet Take 1,000 mg by mouth every 6 (six) hours as needed for mild pain or headache.   Yes [provider]  bismuth subsalicylate (PEPTO BISMOL) 262 MG/15ML suspension Take 30 mLs by mouth every 6 (six) hours as needed for  indigestion or diarrhea or loose stools.   Yes [provider]    Family History Family History  Problem Relation Age of Onset  . Hypertension Mother     Social History Social History   Tobacco Use  . Smoking status: Current Every Day Smoker    Packs/day: 0.50    Types: Cigarettes  . Smokeless tobacco: Current User  Substance Use Topics  . Alcohol use: Yes    Comment: 2 times a week.   . Drug use: Yes    Types: Cocaine, Marijuana, Benzodiazepines    Comment: last used 07/12/17     Allergies   Sumatriptan, Acetaminophen, and Benadryl [diphenhydramine hcl]   Review of Systems Review of Systems  Unable to perform ROS: Mental status change     Physical Exam Updated Vital Signs BP (!) 130/94   Pulse 99   Temp (!) 97.4 F (36.3 C) (Oral)   Resp 12   SpO2 94%   Physical Exam Vitals signs and nursing note reviewed.  Constitutional:      Appearance: He is well-developed.  HENT:     Head: Normocephalic and atraumatic.  Eyes:     Conjunctiva/sclera: Conjunctivae normal.  Neck:     Musculoskeletal: Neck supple.  Cardiovascular:     Rate and Rhythm: Normal rate and regular rhythm.  Pulmonary:     Effort: Pulmonary effort is normal.     Breath sounds: Normal breath sounds.  Comments: Decreased respiratory rate Abdominal:     Palpations: Abdomen is soft.     Tenderness: There is no abdominal tenderness.  Skin:    General: Skin is warm and dry.  Neurological:     Mental Status: He is alert.     Comments: Somnolent but arounsable      ED Treatments / Results  Labs (all labs ordered are listed, but only abnormal results are displayed) Labs Reviewed  COMPREHENSIVE METABOLIC PANEL - Abnormal; Notable for the following components:      Result Value   Potassium 6.4 (*)    CO2 21 (*)    Creatinine, Ser 2.80 (*)    Total Protein 8.4 (*)    AST 99 (*)    ALT 68 (*)    GFR calc non Af Amer 26 (*)    GFR calc Af Amer 31 (*)    Anion gap 18 (*)     All other components within normal limits  CBC - Abnormal; Notable for the following components:   WBC 13.4 (*)    MCV 102.5 (*)    All other components within normal limits  RAPID URINE DRUG SCREEN, HOSP PERFORMED - Abnormal; Notable for the following components:   Opiates POSITIVE (*)    Cocaine POSITIVE (*)    Benzodiazepines POSITIVE (*)    Tetrahydrocannabinol POSITIVE (*)    All other components within normal limits  LIPID PANEL - Abnormal; Notable for the following components:   LDL Cholesterol 103 (*)    All other components within normal limits  HEPATIC FUNCTION PANEL - Abnormal; Notable for the following components:   AST 157 (*)    ALT 84 (*)    All other components within normal limits  BASIC METABOLIC PANEL - Abnormal; Notable for the following components:   Potassium 7.1 (*)    Glucose, Bld 122 (*)    BUN 22 (*)    Creatinine, Ser 3.21 (*)    Calcium 8.5 (*)    GFR calc non Af Amer 22 (*)    GFR calc Af Amer 26 (*)    All other components within normal limits  CBC - Abnormal; Notable for the following components:   WBC 11.1 (*)    MCV 101.1 (*)    All other components within normal limits  LACTIC ACID, PLASMA - Abnormal; Notable for the following components:   Lactic Acid, Venous 4.6 (*)    All other components within normal limits  LACTIC ACID, PLASMA - Abnormal; Notable for the following components:   Lactic Acid, Venous 4.9 (*)    All other components within normal limits  APTT - Abnormal; Notable for the following components:   aPTT 22 (*)    All other components within normal limits  URINALYSIS, ROUTINE W REFLEX MICROSCOPIC - Abnormal; Notable for the following components:   APPearance CLOUDY (*)    Glucose, UA 50 (*)    Hgb urine dipstick LARGE (*)    Protein, ur 100 (*)    Bacteria, UA FEW (*)    All other components within normal limits  BRAIN NATRIURETIC PEPTIDE - Abnormal; Notable for the following components:   B Natriuretic Peptide 343.2 (*)     All other components within normal limits  CK - Abnormal; Notable for the following components:   Total CK 11,202 (*)    All other components within normal limits  CBG MONITORING, ED - Abnormal; Notable for the following components:   Glucose-Capillary 111 (*)  All other components within normal limits  TROPONIN I (HIGH SENSITIVITY) - Abnormal; Notable for the following components:   Troponin I (High Sensitivity) 2,121 (*)    All other components within normal limits  TROPONIN I (HIGH SENSITIVITY) - Abnormal; Notable for the following components:   Troponin I (High Sensitivity) 3,005 (*)    All other components within normal limits  SARS CORONAVIRUS 2 (TAT 6-24 HRS)  EXPECTORATED SPUTUM ASSESSMENT W REFEX TO RESP CULTURE  CULTURE, BLOOD (ROUTINE X 2)  CULTURE, BLOOD (ROUTINE X 2)  RESPIRATORY PANEL BY PCR  URINE CULTURE  ETHANOL  HEMOGLOBIN A1C  STREP PNEUMONIAE URINARY ANTIGEN  PROTIME-INR  PROCALCITONIN  TSH  T4, FREE  HIV ANTIBODY (ROUTINE TESTING W REFLEX)  HIV4GL SAVE TUBE  LEGIONELLA PNEUMOPHILA SEROGP 1 UR AG  INFLUENZA PANEL BY PCR (TYPE A & B)  RENAL FUNCTION PANEL  LACTIC ACID, PLASMA  CBG MONITORING, ED  TROPONIN I (HIGH SENSITIVITY)    EKG EKG Interpretation  Date/Time:  Friday June 27 2019 06:09:21 EDT Ventricular Rate:  99 PR Interval:    QRS Duration: 75 QT Interval:  326 QTC Calculation: 419 R Axis:   111 Text Interpretation:  Sinus rhythm Probable left atrial enlargement Right axis deviation Confirmed by Zadie RhineWickline, Donald ([id number]) on 06/27/2019 6:10:32 AM Also confirmed by Zadie RhineWickline, Donald ([id number]), editor Dorothyann GibbsNarburgh, [place] [phone])  on 06/27/2019 8:40:40 AM   Radiology [place] Renal  Result Date: 06/27/2019 CLINICAL DATA:  Acute kidney injury EXAM: RENAL / URINARY TRACT ULTRASOUND COMPLETE COMPARISON:  None. FINDINGS: Right Kidney: Renal measurements: 11.0 x 4.8 x 5.6 cm = volume: 154.9 mL . Echogenicity and renal cortical thickness are within  normal limits. No mass, perinephric fluid, or hydronephrosis visualized. No sonographically demonstrable calculus or ureterectasis. Left Kidney: Renal measurements: 10.9 x 5.8 x 5.0 cm = volume: 165.1 mL. Echogenicity and renal cortical thickness are within normal limits. No perinephric fluid or hydronephrosis visualized. There is a cyst arising from the upper pole left kidney measuring 1.0 x 1.0 x 0.9 cm. No sonographically demonstrable calculus or ureterectasis. Bladder: Appears normal for degree of bladder distention. IMPRESSION: Small cyst upper pole left kidney.  Study otherwise unremarkable. Electronically Signed   By: Bretta BangWilliam  Woodruff III M.D.   On: 06/27/2019 08:13   Dg Chest Portable 1 View  Result Date: 06/26/2019 CLINICAL DATA:  Shortness of breath post Narcan EXAM: PORTABLE CHEST 1 VIEW COMPARISON:  Radiograph 10/27/2016 FINDINGS: Patchy airspace consolidation and volume loss in the right upper lobe. Remaining aerated portions of the lungs are clear. No pneumothorax or effusion. Cardiomediastinal contours are unremarkable. No acute osseous or soft tissue abnormality. IMPRESSION: Patchy airspace consolidation and volume loss in the right upper lobe, could reflect a pneumonia or sequela of aspiration with atelectatic volume loss. Electronically Signed   By: Kreg ShropshirePrice  DeHay M.D.   On: 06/26/2019 23:12    Procedures Procedures (including critical care time)  Medications Ordered in ED Medications  0.9 %  sodium chloride infusion ( Intravenous New Bag/Given 06/27/19 0825)  dextromethorphan-guaiFENesin (MUCINEX DM) 30-600 MG per 12 hr tablet 1 tablet (has no administration in time range)  levalbuterol (XOPENEX) nebulizer solution 0.63 mg (has no administration in time range)  nicotine (NICODERM CQ - dosed in mg/24 hours) patch 21 mg (21 mg Transdermal Not Given 06/27/19 0930)  oxyCODONE (Oxy IR/ROXICODONE) immediate release tablet 10 mg (has no administration in time range)  ondansetron (ZOFRAN)  tablet 4 mg ( Oral See Alternative 06/27/19 0701)    Or  ondansetron (ZOFRAN) injection 4 mg (4 mg Intravenous Given 06/27/19 0701)  cefTRIAXone (ROCEPHIN) 1 g in sodium chloride 0.9 % 100 mL IVPB (0 g Intravenous Stopped 06/27/19 0541)  azithromycin (ZITHROMAX) 500 mg in sodium chloride 0.9 % 250 mL IVPB (0 mg Intravenous Stopped 06/27/19 0728)  aspirin EC tablet 325 mg (325 mg Oral Given 06/27/19 0926)  atorvastatin (LIPITOR) tablet 40 mg (40 mg Oral Given 06/27/19 0926)  nitroGLYCERIN (NITROSTAT) SL tablet 0.4 mg (has no administration in time range)  sodium bicarbonate 150 mEq in sterile water 1,000 mL infusion ( Intravenous New Bag/Given 06/27/19 0826)  sodium zirconium cyclosilicate (LOKELMA) packet 10 g (has no administration in time range)  naloxone [facility]) injection 0.4 mg (0.4 mg Intravenous Given 06/26/19 2118)  ondansetron (ZOFRAN) injection 4 mg (4 mg Intravenous Given 06/26/19 2217)  lactated ringers bolus 1,000 mL (0 mLs Intravenous Stopped 06/27/19 0338)  insulin aspart (novoLOG) injection 5 Units (5 Units Intravenous Given 06/27/19 0004)    And  dextrose 50 % solution 50 mL (50 mLs Intravenous Given 06/27/19 0005)  sodium zirconium cyclosilicate (LOKELMA) packet 10 g (10 g Oral Given 06/27/19 0006)  sodium bicarbonate injection 50 mEq (50 mEq Intravenous Given 06/27/19 0206)  calcium gluconate 2 g/ 100 mL sodium chloride IVPB (0 g Intravenous Stopped 06/27/19 0759)  insulin aspart (novoLOG) injection 8 Units (8 Units Intravenous Given 06/27/19 0616)  dextrose 50 % solution 50 mL (50 mLs Intravenous Given 06/27/19 0621)  sodium polystyrene (KAYEXALATE) 15 GM/60ML suspension 45 g (45 g Oral Given 06/27/19 0637)  albuterol (PROVENTIL) (2.5 MG/3ML) 0.083% nebulizer solution 5 mg (5 mg Inhalation Given 06/27/19 0659)  sodium bicarbonate injection 50 mEq (50 mEq Intravenous Given 06/27/19 0615)  sodium zirconium cyclosilicate (LOKELMA) packet 10 g (10 g Oral Given 06/27/19 0659)      Initial Impression / Assessment and Plan / ED Course  I have reviewed the triage vital signs and the nursing notes.  Pertinent labs & imaging results that were available during my care of the patient were reviewed by me and considered in my medical decision making (see chart for details).  Clinical Course as of Jun 26 950  Thu Jun 26, 2019  2334 Potassium(!!): 6.4 [JL]  2334 Creatinine(!): 2.80 [JL]  2334 AST(!): 99 [JL]  2334 ALT(!): 68 [JL]    Clinical Course User Index [JL] Ernie Avena, MD       The patient is a 43 year old male with a history of polysubstance abuse who presents to the ED after cocaine and heroin overdose. He arrived somnolent but arousable but became more somnolent. Following a brief episode of hypoxia, 0.4mg  IV Narcan was administered. The patient's mental status improved. He had an episode of emesis following Narcan administration. After he was aroused with Narcan, he complained of chest tightness and malaise. The tightness was located in the left side of his chest. Initial labs concerning for hyperkalemia to 6.4 and an AKI with a Cr of 2.8. Insulin and glucose was administered for his hyperkalemia. EKG did not reveal ST-T changes, peaked T-waves, or QRS widening. A CXR revealed patchy airspace consolidation and volume loss in the RUL that could reflect PNA or aspiration. Given his chest tightness, troponins were ordered and pending. Plan will be for further observation in the ED, reassessment for need for additional Narcan, and likely admission for hyperkalemia and AKI in the setting of heroine and cocaine overdose. Care of the patient was signed out to Dr. Bebe Shaggy at 12:00AM.  Final Clinical Impressions(s) / ED Diagnoses   Final diagnoses:  Hyperkalemia  Cocaine abuse (HCC)  Polysubstance abuse (HCC)  Accidental overdose of heroin, initial encounter [facility])  Chest pressure    ED Discharge Orders    None       Ernie Avena, MD 06/27/19 9390     Marily Memos, MD 06/28/19 [phone]

## 2019-06-27 ENCOUNTER — Encounter (HOSPITAL_COMMUNITY): Payer: Self-pay | Admitting: Internal Medicine

## 2019-06-27 ENCOUNTER — Inpatient Hospital Stay (HOSPITAL_COMMUNITY): Payer: Self-pay

## 2019-06-27 ENCOUNTER — Other Ambulatory Visit: Payer: Self-pay

## 2019-06-27 DIAGNOSIS — F1910 Other psychoactive substance abuse, uncomplicated: Secondary | ICD-10-CM

## 2019-06-27 DIAGNOSIS — I340 Nonrheumatic mitral (valve) insufficiency: Secondary | ICD-10-CM

## 2019-06-27 DIAGNOSIS — T68XXXA Hypothermia, initial encounter: Secondary | ICD-10-CM | POA: Diagnosis present

## 2019-06-27 DIAGNOSIS — R7989 Other specified abnormal findings of blood chemistry: Secondary | ICD-10-CM | POA: Diagnosis present

## 2019-06-27 DIAGNOSIS — D72829 Elevated white blood cell count, unspecified: Secondary | ICD-10-CM | POA: Insufficient documentation

## 2019-06-27 DIAGNOSIS — E875 Hyperkalemia: Secondary | ICD-10-CM

## 2019-06-27 DIAGNOSIS — R945 Abnormal results of liver function studies: Secondary | ICD-10-CM

## 2019-06-27 DIAGNOSIS — T50904D Poisoning by unspecified drugs, medicaments and biological substances, undetermined, subsequent encounter: Secondary | ICD-10-CM

## 2019-06-27 DIAGNOSIS — T401X1A Poisoning by heroin, accidental (unintentional), initial encounter: Secondary | ICD-10-CM

## 2019-06-27 DIAGNOSIS — N179 Acute kidney failure, unspecified: Secondary | ICD-10-CM

## 2019-06-27 DIAGNOSIS — R079 Chest pain, unspecified: Secondary | ICD-10-CM

## 2019-06-27 DIAGNOSIS — N182 Chronic kidney disease, stage 2 (mild): Secondary | ICD-10-CM

## 2019-06-27 DIAGNOSIS — J181 Lobar pneumonia, unspecified organism: Secondary | ICD-10-CM | POA: Diagnosis present

## 2019-06-27 DIAGNOSIS — R778 Other specified abnormalities of plasma proteins: Secondary | ICD-10-CM

## 2019-06-27 DIAGNOSIS — T50901A Poisoning by unspecified drugs, medicaments and biological substances, accidental (unintentional), initial encounter: Secondary | ICD-10-CM | POA: Diagnosis present

## 2019-06-27 DIAGNOSIS — T50904A Poisoning by unspecified drugs, medicaments and biological substances, undetermined, initial encounter: Secondary | ICD-10-CM

## 2019-06-27 DIAGNOSIS — J9601 Acute respiratory failure with hypoxia: Secondary | ICD-10-CM

## 2019-06-27 LAB — URINALYSIS, ROUTINE W REFLEX MICROSCOPIC
Bilirubin Urine: NEGATIVE
Glucose, UA: 50 mg/dL — AB
Ketones, ur: NEGATIVE mg/dL
Leukocytes,Ua: NEGATIVE
Nitrite: NEGATIVE
Protein, ur: 100 mg/dL — AB
Specific Gravity, Urine: 1.015 (ref 1.005–1.030)
pH: 5 (ref 5.0–8.0)

## 2019-06-27 LAB — RAPID URINE DRUG SCREEN, HOSP PERFORMED
Amphetamines: NOT DETECTED
Barbiturates: NOT DETECTED
Benzodiazepines: POSITIVE — AB
Cocaine: POSITIVE — AB
Opiates: POSITIVE — AB
Tetrahydrocannabinol: POSITIVE — AB

## 2019-06-27 LAB — BASIC METABOLIC PANEL
Anion gap: 10 (ref 5–15)
Anion gap: 13 (ref 5–15)
BUN: 22 mg/dL — ABNORMAL HIGH (ref 6–20)
BUN: 21 mg/dL — ABNORMAL HIGH (ref 6–20)
CO2: 25 mmol/L (ref 22–32)
CO2: 29 mmol/L (ref 22–32)
Calcium: 8.5 mg/dL — ABNORMAL LOW (ref 8.9–10.3)
Calcium: 8.3 mg/dL — ABNORMAL LOW (ref 8.9–10.3)
Chloride: 101 mmol/L (ref 98–111)
Chloride: 97 mmol/L — ABNORMAL LOW (ref 98–111)
Creatinine, Ser: 3.21 mg/dL — ABNORMAL HIGH (ref 0.61–1.24)
Creatinine, Ser: 2.62 mg/dL — ABNORMAL HIGH (ref 0.61–1.24)
GFR calc Af Amer: 26 mL/min — ABNORMAL LOW (ref >60)
GFR calc Af Amer: 33 mL/min — ABNORMAL LOW (ref >60)
GFR calc non Af Amer: 22 mL/min — ABNORMAL LOW (ref >60)
GFR calc non Af Amer: 29 mL/min — ABNORMAL LOW (ref >60)
Glucose, Bld: 122 mg/dL — ABNORMAL HIGH (ref 70–99)
Glucose, Bld: 104 mg/dL — ABNORMAL HIGH (ref 70–99)
Potassium: 7.1 mmol/L (ref 3.5–5.1)
Potassium: 3.6 mmol/L (ref 3.5–5.1)
Sodium: 136 mmol/L (ref 135–145)
Sodium: 139 mmol/L (ref 135–145)

## 2019-06-27 LAB — LACTIC ACID, PLASMA
Lactic Acid, Venous: 4.6 mmol/L (ref 0.5–1.9)
Lactic Acid, Venous: 4.9 mmol/L (ref 0.5–1.9)
Lactic Acid, Venous: 2.3 mmol/L (ref 0.5–1.9)

## 2019-06-27 LAB — RESPIRATORY PANEL BY PCR

## 2019-06-27 LAB — EXPECTORATED SPUTUM ASSESSMENT W GRAM STAIN, RFLX TO RESP C

## 2019-06-27 LAB — HEPATIC FUNCTION PANEL
ALT: 84 U/L — ABNORMAL HIGH (ref 0–44)
AST: 157 U/L — ABNORMAL HIGH (ref 15–41)
Albumin: 4.5 g/dL (ref 3.5–5.0)
Alkaline Phosphatase: 66 U/L (ref 38–126)
Bilirubin, Direct: <0.1 mg/dL (ref 0.0–0.2)
Total Bilirubin: 0.7 mg/dL (ref 0.3–1.2)
Total Protein: 7.5 g/dL (ref 6.5–8.1)

## 2019-06-27 LAB — HEMOGLOBIN A1C
Hgb A1c MFr Bld: 5.1 % (ref 4.8–5.6)
Mean Plasma Glucose: 99.67 mg/dL

## 2019-06-27 LAB — RENAL FUNCTION PANEL
Albumin: 3.7 g/dL (ref 3.5–5.0)
Anion gap: 15 (ref 5–15)
BUN: 23 mg/dL — ABNORMAL HIGH (ref 6–20)
CO2: 21 mmol/L — ABNORMAL LOW (ref 22–32)
Calcium: 8.7 mg/dL — ABNORMAL LOW (ref 8.9–10.3)
Chloride: 101 mmol/L (ref 98–111)
Creatinine, Ser: 3.11 mg/dL — ABNORMAL HIGH (ref 0.61–1.24)
GFR calc Af Amer: 27 mL/min — ABNORMAL LOW (ref >60)
GFR calc non Af Amer: 23 mL/min — ABNORMAL LOW (ref >60)
Glucose, Bld: 100 mg/dL — ABNORMAL HIGH (ref 70–99)
Phosphorus: 4.2 mg/dL (ref 2.5–4.6)
Potassium: 5.4 mmol/L — ABNORMAL HIGH (ref 3.5–5.1)
Sodium: 137 mmol/L (ref 135–145)

## 2019-06-27 LAB — CBC
HCT: 45.5 % (ref 39.0–52.0)
Hemoglobin: 15.2 g/dL (ref 13.0–17.0)
MCH: 33.8 pg (ref 26.0–34.0)
MCHC: 33.4 g/dL (ref 30.0–36.0)
MCV: 101.1 fL — ABNORMAL HIGH (ref 80.0–100.0)
Platelets: 204 10*3/uL (ref 150–400)
RBC: 4.5 MIL/uL (ref 4.22–5.81)
RDW: 12.2 % (ref 11.5–15.5)
WBC: 11.1 10*3/uL — ABNORMAL HIGH (ref 4.0–10.5)
nRBC: 0 % (ref 0.0–0.2)

## 2019-06-27 LAB — TROPONIN I (HIGH SENSITIVITY)
Troponin I (High Sensitivity): 1370 ng/L (ref <18)
Troponin I (High Sensitivity): 1843 ng/L (ref <18)
Troponin I (High Sensitivity): 2121 ng/L (ref <18)
Troponin I (High Sensitivity): 2194 ng/L (ref <18)
Troponin I (High Sensitivity): 3005 ng/L (ref <18)

## 2019-06-27 LAB — PROCALCITONIN: Procalcitonin: 0.84 ng/mL

## 2019-06-27 LAB — LIPID PANEL
Cholesterol: 171 mg/dL (ref 0–200)
HDL: 53 mg/dL (ref >40)
LDL Cholesterol: 103 mg/dL — ABNORMAL HIGH (ref 0–99)
Total CHOL/HDL Ratio: 3.2 RATIO
Triglycerides: 75 mg/dL (ref <150)
VLDL: 15 mg/dL (ref 0–40)

## 2019-06-27 LAB — APTT: aPTT: 22 seconds — ABNORMAL LOW (ref 24–36)

## 2019-06-27 LAB — HIV ANTIBODY (ROUTINE TESTING W REFLEX): HIV Screen 4th Generation wRfx: NONREACTIVE

## 2019-06-27 LAB — TSH: TSH: 0.642 u[IU]/mL (ref 0.350–4.500)

## 2019-06-27 LAB — STREP PNEUMONIAE URINARY ANTIGEN: Strep Pneumo Urinary Antigen: NEGATIVE

## 2019-06-27 LAB — CBG MONITORING, ED
Glucose-Capillary: 111 mg/dL — ABNORMAL HIGH (ref 70–99)
Glucose-Capillary: 97 mg/dL (ref 70–99)

## 2019-06-27 LAB — CK: Total CK: 11202 U/L — ABNORMAL HIGH (ref 49–397)

## 2019-06-27 LAB — PROTIME-INR
INR: 1 (ref 0.8–1.2)
Prothrombin Time: 13.5 seconds (ref 11.4–15.2)

## 2019-06-27 LAB — SARS CORONAVIRUS 2 (TAT 6-24 HRS): SARS Coronavirus 2: NEGATIVE

## 2019-06-27 LAB — T4, FREE: Free T4: 0.69 ng/dL (ref 0.61–1.12)

## 2019-06-27 LAB — BRAIN NATRIURETIC PEPTIDE: B Natriuretic Peptide: 343.2 pg/mL — ABNORMAL HIGH (ref 0.0–100.0)

## 2019-06-27 LAB — ECHOCARDIOGRAM COMPLETE

## 2019-06-27 LAB — INFLUENZA PANEL BY PCR (TYPE A & B)
Influenza A By PCR: NEGATIVE
Influenza B By PCR: NEGATIVE

## 2019-06-27 MED ORDER — DEXTROSE 50 % IV SOLN
50.0000 mL | Freq: Once | INTRAVENOUS | Status: AC
  Administered 2019-06-27: 50 mL via INTRAVENOUS
  Filled 2019-06-27: qty 50

## 2019-06-27 MED ORDER — NITROGLYCERIN 0.4 MG SL SUBL: 0.4000 mg | SUBLINGUAL_TABLET | SUBLINGUAL | Status: DC | PRN

## 2019-06-27 MED ORDER — SODIUM BICARBONATE 8.4 % IV SOLN
50.0000 meq | Freq: Once | INTRAVENOUS | Status: AC
  Administered 2019-06-27: 02:00:00 50 meq via INTRAVENOUS

## 2019-06-27 MED ORDER — ONDANSETRON HCL 4 MG PO TABS: 4.0000 mg | ORAL_TABLET | Freq: Four times a day (QID) | ORAL | Status: DC | PRN

## 2019-06-27 MED ORDER — OXYCODONE HCL 5 MG PO TABS
10.0000 mg | ORAL_TABLET | Freq: Four times a day (QID) | ORAL | Status: DC | PRN
  Filled 2019-06-27: qty 2

## 2019-06-27 MED ORDER — CALCIUM GLUCONATE-NACL 2-0.675 GM/100ML-% IV SOLN
2.0000 g | Freq: Once | INTRAVENOUS | Status: AC
  Administered 2019-06-27: 2000 mg via INTRAVENOUS
  Filled 2019-06-27: qty 100

## 2019-06-27 MED ORDER — ONDANSETRON 4 MG PO TBDP
4.0000 mg | ORAL_TABLET | Freq: Four times a day (QID) | ORAL | Status: DC | PRN
  Administered 2019-06-29: 4 mg via ORAL
  Filled 2019-06-27: qty 1

## 2019-06-27 MED ORDER — ALBUTEROL SULFATE (2.5 MG/3ML) 0.083% IN NEBU
5.0000 mg | INHALATION_SOLUTION | Freq: Once | RESPIRATORY_TRACT | Status: AC
  Administered 2019-06-27: 5 mg via RESPIRATORY_TRACT
  Filled 2019-06-27: qty 6

## 2019-06-27 MED ORDER — INSULIN ASPART 100 UNIT/ML IV SOLN
8.0000 [IU] | Freq: Once | INTRAVENOUS | Status: AC
  Administered 2019-06-27: 06:00:00 8 [IU] via INTRAVENOUS

## 2019-06-27 MED ORDER — SODIUM ZIRCONIUM CYCLOSILICATE 10 G PO PACK
10.0000 g | PACK | Freq: Once | ORAL | Status: AC
  Administered 2019-06-27: 07:00:00 10 g via ORAL
  Filled 2019-06-27: qty 1

## 2019-06-27 MED ORDER — SODIUM ZIRCONIUM CYCLOSILICATE 10 G PO PACK
10.0000 g | PACK | Freq: Three times a day (TID) | ORAL | Status: DC
  Filled 2019-06-27: qty 1

## 2019-06-27 MED ORDER — METHOCARBAMOL 500 MG PO TABS
500.0000 mg | ORAL_TABLET | Freq: Three times a day (TID) | ORAL | Status: DC | PRN
  Administered 2019-06-27: 19:00:00 500 mg via ORAL
  Filled 2019-06-27: qty 1

## 2019-06-27 MED ORDER — LEVALBUTEROL HCL 0.63 MG/3ML IN NEBU: 0.6300 mg | INHALATION_SOLUTION | Freq: Four times a day (QID) | RESPIRATORY_TRACT | Status: DC | PRN

## 2019-06-27 MED ORDER — SODIUM POLYSTYRENE SULFONATE 15 GM/60ML PO SUSP
45.0000 g | Freq: Once | ORAL | Status: AC
  Administered 2019-06-27: 07:00:00 45 g via ORAL
  Filled 2019-06-27: qty 180

## 2019-06-27 MED ORDER — ATORVASTATIN CALCIUM 40 MG PO TABS
40.0000 mg | ORAL_TABLET | Freq: Every day | ORAL | Status: DC
  Administered 2019-06-27: 09:00:00 40 mg via ORAL
  Filled 2019-06-27 (×2): qty 1

## 2019-06-27 MED ORDER — SODIUM CHLORIDE 0.9 % IV BOLUS: 1000.0000 mL | Freq: Once | INTRAVENOUS | Status: DC

## 2019-06-27 MED ORDER — NICOTINE 21 MG/24HR TD PT24
21.0000 mg | MEDICATED_PATCH | Freq: Every day | TRANSDERMAL | Status: DC
  Filled 2019-06-27 (×3): qty 1

## 2019-06-27 MED ORDER — ONDANSETRON HCL 4 MG/2ML IJ SOLN
4.0000 mg | Freq: Four times a day (QID) | INTRAMUSCULAR | Status: DC | PRN
  Administered 2019-06-27 – 2019-06-28 (×3): 4 mg via INTRAVENOUS
  Filled 2019-06-27 (×3): qty 2

## 2019-06-27 MED ORDER — DM-GUAIFENESIN ER 30-600 MG PO TB12: 1.0000 | ORAL_TABLET | Freq: Two times a day (BID) | ORAL | Status: DC | PRN

## 2019-06-27 MED ORDER — HYDROXYZINE HCL 25 MG PO TABS
25.0000 mg | ORAL_TABLET | Freq: Four times a day (QID) | ORAL | Status: DC | PRN
  Administered 2019-06-27: 19:00:00 25 mg via ORAL
  Filled 2019-06-27 (×2): qty 1

## 2019-06-27 MED ORDER — LOPERAMIDE HCL 2 MG PO CAPS: 2.0000 mg | ORAL_CAPSULE | ORAL | Status: DC | PRN

## 2019-06-27 MED ORDER — SODIUM CHLORIDE 0.9 % IV SOLN
INTRAVENOUS | Status: DC
  Administered 2019-06-27 – 2019-06-28 (×4): via INTRAVENOUS

## 2019-06-27 MED ORDER — SODIUM CHLORIDE 0.9 % IV SOLN
1.0000 g | INTRAVENOUS | Status: DC
  Administered 2019-06-27 – 2019-06-30 (×4): 1 g via INTRAVENOUS
  Filled 2019-06-27 (×4): qty 10

## 2019-06-27 MED ORDER — SODIUM BICARBONATE 8.4 % IV SOLN
50.0000 meq | Freq: Once | INTRAVENOUS | Status: AC
  Administered 2019-06-27: 50 meq via INTRAVENOUS

## 2019-06-27 MED ORDER — SODIUM ZIRCONIUM CYCLOSILICATE 10 G PO PACK
10.0000 g | PACK | Freq: Three times a day (TID) | ORAL | Status: DC
  Administered 2019-06-27 (×2): 10 g via ORAL
  Filled 2019-06-27 (×2): qty 1

## 2019-06-27 MED ORDER — DICYCLOMINE HCL 20 MG PO TABS: 20.0000 mg | ORAL_TABLET | Freq: Four times a day (QID) | ORAL | Status: DC | PRN

## 2019-06-27 MED ORDER — STERILE WATER FOR INJECTION IV SOLN
INTRAVENOUS | Status: DC
  Administered 2019-06-27 – 2019-06-28 (×4): via INTRAVENOUS
  Filled 2019-06-27 (×6): qty 850

## 2019-06-27 MED ORDER — ACETAMINOPHEN 500 MG PO TABS
500.0000 mg | ORAL_TABLET | Freq: Four times a day (QID) | ORAL | Status: DC | PRN
  Administered 2019-06-27 – 2019-06-29 (×5): 500 mg via ORAL
  Filled 2019-06-27 (×5): qty 1

## 2019-06-27 MED ORDER — SODIUM CHLORIDE 0.9 % IV SOLN
500.0000 mg | INTRAVENOUS | Status: DC
  Administered 2019-06-27 – 2019-06-29 (×3): 500 mg via INTRAVENOUS
  Filled 2019-06-27 (×3): qty 500

## 2019-06-27 MED ORDER — ASPIRIN EC 325 MG PO TBEC
325.0000 mg | DELAYED_RELEASE_TABLET | Freq: Every day | ORAL | Status: DC
  Administered 2019-06-27 – 2019-06-28 (×2): 325 mg via ORAL
  Filled 2019-06-27 (×2): qty 1

## 2019-06-27 NOTE — ED Notes (Signed)
ED TO INPATIENT HANDOFF REPORT  ED Nurse Name and Phone #: Kelly Splinter  S Name/Age/Gender Henry Stanley D Groninger 43 y.o. male Room/Bed: 018C/018C  Code Status   Code Status: Full Code  Home/SNF/Other Home Patient oriented to: self, place, time and situation Is this baseline? Yes   Triage Complete: Triage complete  Chief Complaint Drug OverDose  Triage Note Pt states that he snorted a gram of cocaine and heroin and drank a pint of liquor, pt is cool and diaphoretic. Pt oxygen drops to 84% on RA when he falls to sleep    Allergies Allergies  Allergen Reactions  . Sumatriptan Shortness Of Breath, Nausea And Vomiting, Palpitations and Other (See Comments)    Increased BP also  . Acetaminophen Other (See Comments)    reaction to migraine cocktail - rectal burning and agitation  . Benadryl [Diphenhydramine Hcl] Other (See Comments)     reaction to migraine cocktail - rectal burning and agitation    Level of Care/Admitting Diagnosis ED Disposition    ED Disposition Condition [facility]: [place] [[id number]]  Level of Care: Progressive [102]  Covid Evaluation: Person Under Investigation (PUI)  Diagnosis: Drug overdose [188416]  Admitting Physician: Ivor Costa [[id number]]  Attending Physician: Ivor Costa [phone]  Estimated length of stay: past midnight tomorrow  Certification:: I certify this patient will need inpatient services for at least 2 midnights  PT Class (Do Not Modify): Inpatient [101]  PT Acc Code (Do Not Modify): Private [1]       B Medical/Surgery History Past Medical History:  Diagnosis Date  . Anxiety   . Cocaine abuse ([place])   . Depression   . GERD (gastroesophageal reflux disease)   . Polysubstance abuse ([place])   . Seizures ([place])   . Sinus infection    Past Surgical History:  Procedure Laterality Date  . FEMUR SURGERY       A IV Location/Drains/Wounds Patient Lines/Drains/Airways Status   Active Line/Drains/Airways     Name:   Placement date:   Placement time:   Site:   Days:   Peripheral IV 06/26/19 Right Antecubital   06/26/19    2100    Antecubital   1          Intake/Output Last 24 hours  Intake/Output Summary (Last 24 hours) at 06/27/2019 1539 Last data filed at 06/27/2019 0759 Gross per 24 hour  Intake 348.22 ml  Output -  Net 348.22 ml    Labs/Imaging Results for orders placed or performed during the hospital encounter of 06/26/19 (from the past 48 hour(s))  Comprehensive metabolic panel     Status: Abnormal   Collection Time: 06/26/19  8:58 PM  Result Value Ref Range   Sodium 141 135 - 145 mmol/L   Potassium 6.4 (HH) 3.5 - 5.1 mmol/L    Comment: NO VISIBLE HEMOLYSIS CRITICAL RESULT CALLED TO, READ BACK BY AND VERIFIED WITH: M.[facility] RN 0160 06/26/2019 MCCORMICK K    Chloride 102 98 - 111 mmol/L   CO2 21 (L) 22 - 32 mmol/L   Glucose, Bld 92 70 - 99 mg/dL   BUN 16 6 - 20 mg/dL   Creatinine, Ser 2.80 (H) 0.61 - 1.24 mg/dL   Calcium 9.6 8.9 - 10.3 mg/dL   Total Protein 8.4 (H) 6.5 - 8.1 g/dL   Albumin 5.0 3.5 - 5.0 g/dL   AST 99 (H) 15 - 41 U/L   ALT 68 (H) 0 - 44 U/L  Alkaline Phosphatase 74 38 - 126 U/L   Total Bilirubin 0.5 0.3 - 1.2 mg/dL   GFR calc non Af Amer 26 (L) >60 mL/min   GFR calc Af Amer 31 (L) >60 mL/min   Anion gap 18 (H) 5 - 15    Comment: Performed at [facility], [address], [place], [place] [address]  Ethanol     Status: None   Collection Time: 06/26/19  8:58 PM  Result Value Ref Range   Alcohol, Ethyl (B) <10 <10 mg/dL    Comment: (NOTE) Lowest detectable limit for serum alcohol is 10 mg/dL. For medical purposes only. Performed at [facility], [address] [place], [place] [address]   cbc     Status: Abnormal   Collection Time: 06/26/19  8:58 PM  Result Value Ref Range   WBC 13.4 (H) 4.0 - 10.5 K/uL   RBC 4.74 4.22 - 5.81 MIL/uL   Hemoglobin 15.8 13.0 - 17.0 g/dL   HCT 09.8 11.9 - 14.7 %   MCV 102.5 (H) 80.0 - 100.0 fL    MCH 33.3 26.0 - 34.0 pg   MCHC 32.5 30.0 - 36.0 g/dL   RDW 82.9 56.2 - 13.0 %   Platelets 236 150 - 400 K/uL   nRBC 0.0 0.0 - 0.2 %    Comment: Performed at [facility], [address], [place], [place] [address]  Troponin I (High Sensitivity)     Status: Abnormal   Collection Time: 06/26/19 11:55 PM  Result Value Ref Range   Troponin I (High Sensitivity) 2,121 (HH) <18 ng/L    Comment: CRITICAL RESULT CALLED TO, READ BACK BY AND VERIFIED WITH: FLORES,M RN 06/27/2019 0047 JORDANS (NOTE) Elevated high sensitivity troponin I (hsTnI) values and significant  changes across serial measurements may suggest ACS but many other  chronic and acute conditions are known to elevate hsTnI results.  Refer to the Links section for chest pain algorithms and additional  guidance. Performed at [facility], [address] [place], [place] [address]   Troponin I (High Sensitivity)     Status: Abnormal   Collection Time: 06/27/19  1:32 AM  Result Value Ref Range   Troponin I (High Sensitivity) 3,005 (HH) <18 ng/L    Comment: CRITICAL VALUE NOTED.  VALUE IS CONSISTENT WITH PREVIOUSLY REPORTED AND CALLED VALUE. (NOTE) Elevated high sensitivity troponin I (hsTnI) values and significant  changes across serial measurements may suggest ACS but many other  chronic and acute conditions are known to elevate hsTnI results.  Refer to the Links section for chest pain algorithms and additional  guidance. Performed at [facility], [address] [place], [place] [address]   CBG monitoring, ED     Status: Abnormal   Collection Time: 06/27/19  2:00 AM  Result Value Ref Range   Glucose-Capillary 111 (H) 70 - 99 mg/dL  SARS CORONAVIRUS 2 (TAT 6-24 HRS) Nasopharyngeal Nasopharyngeal Swab     Status: None   Collection Time: 06/27/19  2:15 AM   Specimen: Nasopharyngeal Swab  Result Value Ref Range   SARS Coronavirus 2 NEGATIVE NEGATIVE    Comment: (NOTE) SARS-CoV-2 target nucleic acids  are NOT DETECTED. The SARS-CoV-2 RNA is generally detectable in upper and lower respiratory specimens during the acute phase of infection. Negative results do not preclude SARS-CoV-2 infection, do not rule out co-infections with other pathogens, and should not be used as the sole basis for treatment or other  patient management decisions. Negative results must be combined with clinical observations, patient history, and epidemiological information. The expected result is Negative. Fact Sheet for Patients: HairSlick.no Fact Sheet for Healthcare Providers: quierodirigir.com This test is not yet approved or cleared by the [place] FDA and  has been authorized for detection and/or diagnosis of SARS-CoV-2 by FDA under an Emergency Use Authorization (EUA). This EUA will remain  in effect (meaning this test can be used) for the duration of the COVID-19 declaration under Section 56 4(b)(1) of the Act, 21 U.S.C. section 360bbb-3(b)(1), unless the authorization is terminated or revoked sooner. Performed at [facility], [address] [place], [place] [address]   Strep pneumoniae urinary antigen     Status: None   Collection Time: 06/27/19  3:40 AM  Result Value Ref Range   Strep Pneumo Urinary Antigen NEGATIVE NEGATIVE    Comment:        Infection due to S. pneumoniae cannot be absolutely ruled out since the antigen present may be below the detection limit of the test. Performed at [facility], [address] [place], [place] [address]   Hemoglobin A1c     Status: None   Collection Time: 06/27/19  4:44 AM  Result Value Ref Range   Hgb A1c MFr Bld 5.1 4.8 - 5.6 %    Comment: (NOTE) Pre diabetes:          5.7%-6.4% Diabetes:              >6.4% Glycemic control for   <7.0% adults with diabetes    Mean Plasma Glucose 99.67 mg/dL    Comment: Performed at [facility], [address], [place], [place]  [address]  T4, free     Status: None   Collection Time: 06/27/19  4:44 AM  Result Value Ref Range   Free T4 0.69 0.61 - 1.12 ng/dL    Comment: (NOTE) Biotin ingestion may interfere with free T4 tests. If the results are inconsistent with the TSH level, previous test results, or the clinical presentation, then consider biotin interference. If needed, order repeat testing after stopping biotin. Performed at [facility], [address] [place], [place] [address]   CK     Status: Abnormal   Collection Time: 06/27/19  4:44 AM  Result Value Ref Range   Total CK 11,202 (H) 49 - 397 U/L    Comment: RESULTS CONFIRMED BY MANUAL DILUTION Performed at [facility], [address] [place], [place] [address]   Lipid panel     Status: Abnormal   Collection Time: 06/27/19  4:45 AM  Result Value Ref Range   Cholesterol 171 0 - 200 mg/dL   Triglycerides 75 <130 mg/dL   HDL 53 >86 mg/dL   Total CHOL/HDL Ratio 3.2 RATIO   VLDL 15 0 - 40 mg/dL   LDL Cholesterol 578 (H) 0 - 99 mg/dL    Comment:        Total Cholesterol/HDL:CHD Risk Coronary Heart Disease Risk Table                     Men   Women  1/2 Average Risk   3.4   3.3  Average Risk       5.0   4.4  2 X Average Risk   9.6   7.1  3 X Average Risk  23.4   11.0        Use the  calculated Patient Ratio above and the CHD Risk Table to determine the patient's CHD Risk.        ATP III CLASSIFICATION (LDL):  <100     mg/dL   Optimal  086-761  mg/dL   Near or Above                    Optimal  130-159  mg/dL   Borderline  950-932  mg/dL   High  >671     mg/dL   Very High Performed at [facility], [address], [place], [place] [address]   Brain natriuretic peptide     Status: Abnormal   Collection Time: 06/27/19  4:45 AM  Result Value Ref Range   B Natriuretic Peptide 343.2 (H) 0.0 - 100.0 pg/mL    Comment: Performed at [facility], [address], [place], [place] [address]  TSH     Status: None   Collection  Time: 06/27/19  4:45 AM  Result Value Ref Range   TSH 0.642 0.350 - 4.500 uIU/mL    Comment: Performed by a 3rd Generation assay with a functional sensitivity of <=0.01 uIU/mL. Performed at [facility], [address], [place], [place] [address]   Hepatic function panel     Status: Abnormal   Collection Time: 06/27/19  4:46 AM  Result Value Ref Range   Total Protein 7.5 6.5 - 8.1 g/dL   Albumin 4.5 3.5 - 5.0 g/dL   AST 397 (H) 15 - 41 U/L   ALT 84 (H) 0 - 44 U/L   Alkaline Phosphatase 66 38 - 126 U/L   Total Bilirubin 0.7 0.3 - 1.2 mg/dL   Bilirubin, Direct <6.7 0.0 - 0.2 mg/dL   Indirect Bilirubin NOT CALCULATED 0.3 - 0.9 mg/dL    Comment: Performed at [facility], [address], [place], [place] [address]  HIV Antibody (routine testing w rflx)     Status: None   Collection Time: 06/27/19  4:46 AM  Result Value Ref Range   HIV Screen 4th Generation wRfx NON REACTIVE NON REACTIVE    Comment: Performed at [facility], [address] [place], [place] [address]  Basic metabolic panel     Status: Abnormal   Collection Time: 06/27/19  4:46 AM  Result Value Ref Range   Sodium 136 135 - 145 mmol/L   Potassium 7.1 (HH) 3.5 - 5.1 mmol/L    Comment: NO VISIBLE HEMOLYSIS CRITICAL RESULT CALLED TO, READ BACK BY AND VERIFIED WITH: M FLOREZ,RN 1016 0542 WILDERK    Chloride 101 98 - 111 mmol/L   CO2 25 22 - 32 mmol/L   Glucose, Bld 122 (H) 70 - 99 mg/dL   BUN 22 (H) 6 - 20 mg/dL   Creatinine, Ser 0.97 (H) 0.61 - 1.24 mg/dL   Calcium 8.5 (L) 8.9 - 10.3 mg/dL   GFR calc non Af Amer 22 (L) >60 mL/min   GFR calc Af Amer 26 (L) >60 mL/min   Anion gap 10 5 - 15    Comment: Performed at [facility], [address] [place], [place] [address]  CBC     Status: Abnormal   Collection Time: 06/27/19  4:46 AM  Result Value Ref Range   WBC 11.1 (H) 4.0 - 10.5 K/uL   RBC 4.50 4.22 - 5.81 MIL/uL   Hemoglobin 15.2 13.0 - 17.0 g/dL   HCT 92.4 26.8 - 34.1 %  MCV 101.1  (H) 80.0 - 100.0 fL   MCH 33.8 26.0 - 34.0 pg   MCHC 33.4 30.0 - 36.0 g/dL   RDW 16.1 09.6 - 04.5 %   Platelets 204 150 - 400 K/uL   nRBC 0.0 0.0 - 0.2 %    Comment: Performed at [facility], [address] [place], [place] [address]  Lactic acid, plasma     Status: Abnormal   Collection Time: 06/27/19  4:46 AM  Result Value Ref Range   Lactic Acid, Venous 4.6 (HH) 0.5 - 1.9 mmol/L    Comment: CRITICAL RESULT CALLED TO, READ BACK BY AND VERIFIED WITH: Virgel Manifold [id number] [facility] Performed at [facility], [address] [place], [place] [address]   Protime-INR     Status: None   Collection Time: 06/27/19  4:46 AM  Result Value Ref Range   Prothrombin Time 13.5 11.4 - 15.2 seconds   INR 1.0 0.8 - 1.2    Comment: (NOTE) INR goal varies based on device and disease states. Performed at [facility], [address], [place], [place] [address]   APTT     Status: Abnormal   Collection Time: 06/27/19  4:46 AM  Result Value Ref Range   aPTT 22 (L) 24 - 36 seconds    Comment: Performed at [facility], [address], [place], [place] [address]  Procalcitonin     Status: None   Collection Time: 06/27/19  4:46 AM  Result Value Ref Range   Procalcitonin 0.84 ng/mL    Comment:        Interpretation: PCT > 0.5 ng/mL and <= 2 ng/mL: Systemic infection (sepsis) is possible, but other conditions are known to elevate PCT as well. (NOTE)       Sepsis PCT Algorithm           Lower Respiratory Tract                                      Infection PCT Algorithm    ----------------------------     ----------------------------         PCT < 0.25 ng/mL                PCT < 0.10 ng/mL         Strongly encourage             Strongly discourage   discontinuation of antibiotics    initiation of antibiotics    ----------------------------     -----------------------------       PCT 0.25 - 0.50 ng/mL            PCT 0.10 - 0.25 ng/mL               OR       >80%  decrease in PCT            Discourage initiation of                                            antibiotics      Encourage discontinuation           of antibiotics    ----------------------------     -----------------------------         PCT >=  0.50 ng/mL              PCT 0.26 - 0.50 ng/mL                AND       <80% decrease in PCT             Encourage initiation of                                             antibiotics       Encourage continuation           of antibiotics    ----------------------------     -----------------------------        PCT >= 0.50 ng/mL                  PCT > 0.50 ng/mL               AND         increase in PCT                  Strongly encourage                                      initiation of antibiotics    Strongly encourage escalation           of antibiotics                                     -----------------------------                                           PCT <= 0.25 ng/mL                                                 OR                                        > 80% decrease in PCT                                     Discontinue / Do not initiate                                             antibiotics Performed at [facility], [address], [place], [place] [address]   Rapid urine drug screen (hospital performed)     Status: Abnormal   Collection Time: 06/27/19  5:01 AM  Result Value Ref Range   Opiates POSITIVE (A) NONE DETECTED   Cocaine POSITIVE (A) NONE DETECTED   Benzodiazepines POSITIVE (A) NONE DETECTED   Amphetamines NONE DETECTED NONE DETECTED  Tetrahydrocannabinol POSITIVE (A) NONE DETECTED   Barbiturates NONE DETECTED NONE DETECTED    Comment: (NOTE) DRUG SCREEN FOR MEDICAL PURPOSES ONLY.  IF CONFIRMATION IS NEEDED FOR ANY PURPOSE, NOTIFY LAB WITHIN 5 DAYS. LOWEST DETECTABLE LIMITS FOR URINE DRUG SCREEN Drug Class                     Cutoff (ng/mL) Amphetamine and metabolites    1000 Barbiturate and  metabolites    200 Benzodiazepine                 200 Tricyclics and metabolites     300 Opiates and metabolites        300 Cocaine and metabolites        300 THC                            50 Performed at [facility], [address], [place], [place] [address]   Urinalysis, Routine w reflex microscopic     Status: Abnormal   Collection Time: 06/27/19  5:01 AM  Result Value Ref Range   Color, Urine YELLOW YELLOW   APPearance CLOUDY (A) CLEAR   Specific Gravity, Urine 1.015 1.005 - 1.030   pH 5.0 5.0 - 8.0   Glucose, UA 50 (A) NEGATIVE mg/dL   Hgb urine dipstick LARGE (A) NEGATIVE   Bilirubin Urine NEGATIVE NEGATIVE   Ketones, ur NEGATIVE NEGATIVE mg/dL   Protein, ur 604 (A) NEGATIVE mg/dL   Nitrite NEGATIVE NEGATIVE   Leukocytes,Ua NEGATIVE NEGATIVE   RBC / HPF 0-5 0 - 5 RBC/hpf   WBC, UA 11-20 0 - 5 WBC/hpf   Bacteria, UA FEW (A) NONE SEEN   Squamous Epithelial / LPF 0-5 0 - 5   Mucus PRESENT    Hyaline Casts, UA PRESENT    Granular Casts, UA PRESENT     Comment: Performed at [facility], [address], [place], [place] [address]  Lactic acid, plasma     Status: Abnormal   Collection Time: 06/27/19  6:56 AM  Result Value Ref Range   Lactic Acid, Venous 4.9 (HH) 0.5 - 1.9 mmol/L    Comment: CRITICAL VALUE NOTED.  VALUE IS CONSISTENT WITH PREVIOUSLY REPORTED AND CALLED VALUE. Performed at [facility], [address] [place], [place] [address]   CBG monitoring, ED     Status: None   Collection Time: 06/27/19  8:59 AM  Result Value Ref Range   Glucose-Capillary 97 70 - 99 mg/dL  Renal function panel     Status: Abnormal   Collection Time: 06/27/19  9:00 AM  Result Value Ref Range   Sodium 137 135 - 145 mmol/L   Potassium 5.4 (H) 3.5 - 5.1 mmol/L   Chloride 101 98 - 111 mmol/L   CO2 21 (L) 22 - 32 mmol/L   Glucose, Bld 100 (H) 70 - 99 mg/dL   BUN 23 (H) 6 - 20 mg/dL   Creatinine, Ser 7.82 (H) 0.61 - 1.24 mg/dL   Calcium 8.7 (L) 8.9 - 10.3 mg/dL    Phosphorus 4.2 2.5 - 4.6 mg/dL   Albumin 3.7 3.5 - 5.0 g/dL   GFR calc non Af Amer 23 (L) >60 mL/min   GFR calc Af Amer 27 (L) >60 mL/min   Anion gap 15 5 - 15    Comment: Performed at [facility], [address] [place], [place] [address]  Troponin I (High Sensitivity)     Status: Abnormal   Collection Time: 06/27/19  9:00 AM  Result Value Ref Range   Troponin I (High Sensitivity) 2,194 (HH) <18 ng/L    Comment: CRITICAL VALUE NOTED.  VALUE IS CONSISTENT WITH PREVIOUSLY REPORTED AND CALLED VALUE. (NOTE) Elevated high sensitivity troponin I (hsTnI) values and significant  changes across serial measurements may suggest ACS but many other  chronic and acute conditions are known to elevate hsTnI results.  Refer to the Links section for chest pain algorithms and additional  guidance. Performed at [facility], [address] [place], [place] [address]   Lactic acid, plasma     Status: Abnormal   Collection Time: 06/27/19  2:15 PM  Result Value Ref Range   Lactic Acid, Venous 2.3 (HH) 0.5 - 1.9 mmol/L    Comment: CRITICAL VALUE NOTED.  VALUE IS CONSISTENT WITH PREVIOUSLY REPORTED AND CALLED VALUE. Performed at [facility], [address] [place], [place] [address]    [place] Renal  Result Date: 06/27/2019 CLINICAL DATA:  Acute kidney injury EXAM: RENAL / URINARY TRACT ULTRASOUND COMPLETE COMPARISON:  None. FINDINGS: Right Kidney: Renal measurements: 11.0 x 4.8 x 5.6 cm = volume: 154.9 mL . Echogenicity and renal cortical thickness are within normal limits. No mass, perinephric fluid, or hydronephrosis visualized. No sonographically demonstrable calculus or ureterectasis. Left Kidney: Renal measurements: 10.9 x 5.8 x 5.0 cm = volume: 165.1 mL. Echogenicity and renal cortical thickness are within normal limits. No perinephric fluid or hydronephrosis visualized. There is a cyst arising from the upper pole left kidney measuring 1.0 x 1.0 x 0.9 cm. No sonographically  demonstrable calculus or ureterectasis. Bladder: Appears normal for degree of bladder distention. IMPRESSION: Small cyst upper pole left kidney.  Study otherwise unremarkable. Electronically Signed   By: Bretta BangWilliam  Woodruff III M.D.   On: 06/27/2019 08:13   Dg Chest Portable 1 View  Result Date: 06/26/2019 CLINICAL DATA:  Shortness of breath post Narcan EXAM: PORTABLE CHEST 1 VIEW COMPARISON:  Radiograph 10/27/2016 FINDINGS: Patchy airspace consolidation and volume loss in the right upper lobe. Remaining aerated portions of the lungs are clear. No pneumothorax or effusion. Cardiomediastinal contours are unremarkable. No acute osseous or soft tissue abnormality. IMPRESSION: Patchy airspace consolidation and volume loss in the right upper lobe, could reflect a pneumonia or sequela of aspiration with atelectatic volume loss. Electronically Signed   By: Kreg ShropshirePrice  DeHay M.D.   On: 06/26/2019 23:12    Pending Labs Unresulted Labs (From admission, onward)    Start     Ordered   06/28/19 0500  Comprehensive metabolic panel  Daily,   R     06/27/19 0840   06/28/19 0500  Phosphorus  Daily,   R     06/27/19 0840   06/28/19 0500  CK  Daily,   R     06/27/19 0840   06/28/19 0500  Lactic acid, plasma  Tomorrow morning,   STAT     06/27/19 0925   06/27/19 0920  Urine Culture  Add-on,   AD     06/27/19 0919   06/27/19 0340  HIV4GL Save Tube  (HIV Antibody (Routine testing w reflex) panel)  Once,   STAT     06/27/19 0339   06/27/19 0340  Culture, sputum-assessment  Once,   R     06/27/19 0339   06/27/19 0340  Legionella Pneumophila Serogp 1 Ur Ag  Once,   STAT  06/27/19 0339   06/27/19 0340  Culture, blood (routine x 2) Call MD if unable to obtain prior to antibiotics being given  BLOOD CULTURE X 2,   R (with STAT occurrences)    Comments: If blood cultures drawn in Emergency Department - Do not draw and cancel order    06/27/19 0339   06/27/19 0340  Influenza panel by PCR (type A & B)  (Influenza PCR  Panel)  Add-on,   AD     06/27/19 0339   06/27/19 0340  Respiratory Panel by PCR  (Respiratory virus panel with precautions)  Add-on,   AD     06/27/19 0339          Vitals/Pain Today's Vitals   06/27/19 1430 06/27/19 1445 06/27/19 1530 06/27/19 1533  BP:  123/82 123/87   Pulse: 97 94 100   Resp: 19 20 14    Temp:      TempSrc:      SpO2: 98% 95% 98%   PainSc:    Asleep    Isolation Precautions Droplet precaution  Medications Medications  0.9 %  sodium chloride infusion ( Intravenous New Bag/Given 06/27/19 0825)  dextromethorphan-guaiFENesin (MUCINEX DM) 30-600 MG per 12 hr tablet 1 tablet (has no administration in time range)  levalbuterol (XOPENEX) nebulizer solution 0.63 mg (has no administration in time range)  nicotine (NICODERM CQ - dosed in mg/24 hours) patch 21 mg (21 mg Transdermal Not Given 06/27/19 0930)  oxyCODONE (Oxy IR/ROXICODONE) immediate release tablet 10 mg (has no administration in time range)  ondansetron (ZOFRAN) tablet 4 mg ( Oral See Alternative 06/27/19 0701)    Or  ondansetron (ZOFRAN) injection 4 mg (4 mg Intravenous Given 06/27/19 0701)  cefTRIAXone (ROCEPHIN) 1 g in sodium chloride 0.9 % 100 mL IVPB (0 g Intravenous Stopped 06/27/19 0541)  azithromycin (ZITHROMAX) 500 mg in sodium chloride 0.9 % 250 mL IVPB (0 mg Intravenous Stopped 06/27/19 0728)  aspirin EC tablet 325 mg (325 mg Oral Given 06/27/19 0926)  atorvastatin (LIPITOR) tablet 40 mg (40 mg Oral Given 06/27/19 0926)  nitroGLYCERIN (NITROSTAT) SL tablet 0.4 mg (has no administration in time range)  sodium bicarbonate 150 mEq in sterile water 1,000 mL infusion ( Intravenous New Bag/Given 06/27/19 0826)  sodium zirconium cyclosilicate (LOKELMA) packet 10 g (10 g Oral Given 06/27/19 1115)  naloxone [facility]) injection 0.4 mg (0.4 mg Intravenous Given 06/26/19 2118)  ondansetron (ZOFRAN) injection 4 mg (4 mg Intravenous Given 06/26/19 2217)  lactated ringers bolus 1,000 mL (0 mLs Intravenous  Stopped 06/27/19 0338)  insulin aspart (novoLOG) injection 5 Units (5 Units Intravenous Given 06/27/19 0004)    And  dextrose 50 % solution 50 mL (50 mLs Intravenous Given 06/27/19 0005)  sodium zirconium cyclosilicate (LOKELMA) packet 10 g (10 g Oral Given 06/27/19 0006)  sodium bicarbonate injection 50 mEq (50 mEq Intravenous Given 06/27/19 0206)  calcium gluconate 2 g/ 100 mL sodium chloride IVPB (0 g Intravenous Stopped 06/27/19 0759)  insulin aspart (novoLOG) injection 8 Units (8 Units Intravenous Given 06/27/19 0616)  dextrose 50 % solution 50 mL (50 mLs Intravenous Given 06/27/19 0621)  sodium polystyrene (KAYEXALATE) 15 GM/60ML suspension 45 g (45 g Oral Given 06/27/19 0637)  albuterol (PROVENTIL) (2.5 MG/3ML) 0.083% nebulizer solution 5 mg (5 mg Inhalation Given 06/27/19 0659)  sodium bicarbonate injection 50 mEq (50 mEq Intravenous Given 06/27/19 0615)  sodium zirconium cyclosilicate (LOKELMA) packet 10 g (10 g Oral Given 06/27/19 0659)    Mobility walks Low fall risk  Focused Assessments Neuro Assessment Handoff:   Cardiac Rhythm: Normal sinus rhythm NIH Stroke Scale ( + Modified Stroke Scale Criteria)  LOC Questions (1b. )   +: Answers both questions correctly LOC Commands (1c. )   + : Performs both tasks correctly Best Gaze (2. )  +: Normal Visual (3. )  +: No visual loss Motor Arm, Left (5a. )   +: No drift Motor Arm, Right (5b. )   +: No drift Motor Leg, Left (6a. )   +: No drift Motor Leg, Right (6b. )   +: No drift Sensory (8. )   +: Normal, no sensory loss Best Language (9. )   +: No aphasia Extinction/Inattention (11.)   +: No Abnormality Modified SS Total  +: 0     Neuro Assessment: Within Defined Limits Neuro Checks:      Last Documented NIHSS Modified Score: 0 (06/27/19 0400) Has TPA been given? No If patient is a Neuro Trauma and patient is going to OR before floor call report to 4N Charge nurse: [phone] or  [phone]     R Recommendations: See Admitting Provider Note  Report given to:   Additional Notes:

## 2019-06-27 NOTE — Consult Note (Addendum)
Cardiology Consultation:   Patient ID: Henry Stanley MRN: [id number]; DOB: [birth year]  Admit date: 06/26/2019 Date of Consult: 06/27/2019  Primary Care Provider: Patient, No Pcp Per Primary Cardiologist: No primary care provider on file.  Primary Electrophysiologist:  None    Patient Profile:   Henry Stanley is a 43 y.o. male with ongoing PSA (cocaine, etoh, heroine) who presents with nausea and chest pain.   History of Present Illness:   Henry Stanley was in his usual state of health until yesterday evening, when he reports snorting 1 gram of cocaine as well as snorting heroine. He proceeded to fall asleep for "a long time" and his companions were reportedly worried because they were unable to wake him (though they told him he was still breathing). When he did wake, he was nauseous, vomited twice, and developed CP and SOB. He has had similar CP in the past with cocaine use, and it usually self-resolves within 1-2 days. He has not experienced the degree of SOB he felt tonight, nor has he vomited.   He has not previously been told of heart problems. He has no family history of CAD or SCD. When sober, he denies CP, orthopnea, PND, or LE edema.   Upon arrival to the ER, patient with BP 91/73. T 97.4. Initial labs notable for metabolic disarray with K 6.4, HCO3 21, and Cr 2.8 (BL 1.2). Mild transaminitis (AST 99 ALT 68) noted. Lactate 3.1. hsTn sent and returned at 2121.   Cardiology consult out of concern for NSTEMI in setting of cocaine use.    Past Medical History:  Diagnosis Date  . Anxiety   . Cocaine abuse (HCC)   . Depression   . GERD (gastroesophageal reflux disease)   . Polysubstance abuse (HCC)   . Seizures (HCC)   . Sinus infection     Past Surgical History:  Procedure Laterality Date  . FEMUR SURGERY       Home Medications:  Prior to Admission medications   Medication Sig Start Date End Date Taking? Authorizing Provider  amoxicillin-clavulanate (AUGMENTIN)  875-125 MG tablet Take 1 tablet by mouth every 12 (twelve) hours. 11/12/18   Dahlia Byes A, NP  cetirizine (EQ ALLERGY RELIEF, CETIRIZINE,) 10 MG tablet Take 1 tablet (10 mg total) by mouth daily. 07/31/17   Caccavale, Sophia, PA-C  cetirizine-pseudoephedrine (ZYRTEC-D) 5-120 MG tablet Take 1 tablet by mouth daily. 11/12/18   Bast, Gloris Manchester A, NP  fluticasone (FLONASE) 50 MCG/ACT nasal spray Place 1 spray into both nostrils daily. 11/12/18   Dahlia Byes A, NP  ibuprofen (ADVIL,MOTRIN) 200 MG tablet Take 400 mg by mouth every 6 (six) hours as needed for headache or mild pain.    [provider]  methocarbamol (ROBAXIN) 500 MG tablet Take 1 tablet (500 mg total) by mouth 2 (two) times daily. 10/25/17   Palumbo, April, MD  naproxen (NAPROSYN) 375 MG tablet Take 1 tablet (375 mg total) by mouth 2 (two) times daily with a meal. 10/25/17   Palumbo, April, MD  oxymetazoline (AFRIN) 0.05 % nasal spray Place 1 spray into both nostrils 2 (two) times daily as needed for congestion.    [provider]    Inpatient Medications: Scheduled Meds:  Continuous Infusions: . sodium chloride 100 mL/hr at 06/27/19 0215   PRN Meds: oxyCODONE  Allergies:    Allergies  Allergen Reactions  . Sumatriptan Shortness Of Breath, Nausea And Vomiting, Palpitations and Other (See Comments)    Increased BP also  . Acetaminophen  Other (See Comments)    reaction to migraine cocktail - rectal burning and agitation  . Benadryl [Diphenhydramine Hcl] Other (See Comments)     reaction to migraine cocktail - rectal burning and agitation    Social History:   Social History   Socioeconomic History  . Marital status: Single    Spouse name: Not on file  . Number of children: Not on file  . Years of education: Not on file  . Highest education level: Not on file  Occupational History  . Not on file  Social Needs  . Financial resource strain: Not on file  . Food insecurity    Worry: Not on file    Inability: Not  on file  . Transportation needs    Medical: Not on file    Non-medical: Not on file  Tobacco Use  . Smoking status: Current Every Day Smoker    Packs/day: 0.50    Types: Cigarettes  . Smokeless tobacco: Current User  Substance and Sexual Activity  . Alcohol use: Yes    Comment: 2 times a week.   . Drug use: Yes    Types: Cocaine, Marijuana, Benzodiazepines    Comment: last used 07/12/17  . Sexual activity: Yes    Birth control/protection: Condom  Lifestyle  . Physical activity    Days per week: Not on file    Minutes per session: Not on file  . Stress: Not on file  Relationships  . Social Herbalist on phone: Not on file    Gets together: Not on file    Attends religious service: Not on file    Active member of club or organization: Not on file    Attends meetings of clubs or organizations: Not on file    Relationship status: Not on file  . Intimate partner violence    Fear of current or ex partner: Not on file    Emotionally abused: Not on file    Physically abused: Not on file    Forced sexual activity: Not on file  Other Topics Concern  . Not on file  Social History Narrative   ** Merged History Encounter **        Family History:   No family history of CAD.   Review of Systems: [y] = yes, [ ]  = no     General: Weight gain [ ] ; Weight loss [ ] ; Anorexia [ ] ; Fatigue [ ] ; Fever [ ] ; Chills [ ] ; Weakness [ ]    Cardiac: Chest pain/pressure [ y]; Resting SOB Blue.Reese ]; Exertional SOB [ ] ; Orthopnea [ y]; Pedal Edema [ ] ; Palpitations [ ] ; Syncope [ ] ; Presyncope [ ] ; Paroxysmal nocturnal dyspnea[ ]    Pulmonary: Cough [ ] ; Wheezing[ ] ; Hemoptysis[ ] ; Sputum [ ] ; Snoring [ ]    GI: Vomiting[ ] ; Dysphagia[ ] ; Melena[ ] ; Hematochezia [ ] ; Heartburn[ ] ; Abdominal pain [ ] ; Constipation [ ] ; Diarrhea [ ] ; BRBPR [ ]    GU: Hematuria[ ] ; Dysuria [ ] ; Nocturia[ ]    Vascular: Pain in legs with walking [ ] ; Pain in feet with lying flat [ ] ; Non-healing sores [ ] ;  Stroke [ ] ; TIA [ ] ; Slurred speech [ ] ;   Neuro: Headaches[ ] ; Vertigo[ ] ; Seizures[ ] ; Paresthesias[ ] ;Blurred vision [ ] ; Diplopia [ ] ; Vision changes [ ]    Ortho/Skin: Arthritis [ ] ; Joint pain [ ] ; Muscle pain [ ] ; Joint swelling [ ] ; Back Pain [ ] ; Rash [ ]    Psych:  Depression[ ] ; Anxiety[ ]    Heme: Bleeding problems ; Clotting disorders ; Anemia    Endocrine: Diabetes ; Thyroid dysfunction[ ]   Physical Exam/Data:   Vitals:   06/27/19 0000 06/27/19 0045 06/27/19 0100 06/27/19 0115  BP: 109/90 116/83 (!) 121/57 (!) 115/94  Pulse: 99 92 94 97  Resp: 16 (!) 8 (!) 1 10  Temp:      TempSrc:      SpO2: 94% 97% 94% 96%   No intake or output data in the 24 hours ending 06/27/19 0215 There were no vitals filed for this visit. There is no height or weight on file to calculate BMI.  General:  Lethargic appearing african [place] man.  Lymph: no adenopathy Neck: no JVD. Vascular: No carotid bruits; 1+ radial pulses.   Cardiac:  normal S1, S2; RRR; no murmurs. Lungs:  clear to auscultation bilaterally, no wheezing, rhonchi or rales  Abd: soft, nontender, no hepatomegaly  Ext: no edema. Lower extremities very cold.  Musculoskeletal:  No deformities, BUE and BLE strength normal and equal Skin: cool and dry  Neuro:  CNs 2-12 intact, no focal abnormalities noted Psych:  Normal affect   EKG:  The EKG was personally reviewed and demonstrates sinus tachycardia with suggestion of LAE. No Q waves. Peaked T waves (K 6.4). No ischemic changes.   Relevant CV Studies: n/a  Laboratory Data:  Chemistry Recent Labs  Lab 06/26/19 2058  NA 141  K 6.4*  CL 102  CO2 21*  GLUCOSE 92  BUN 16  CREATININE 2.80*  CALCIUM 9.6  GFRNONAA 26*  GFRAA 31*  ANIONGAP 18*    Recent Labs  Lab 06/26/19 2058  PROT 8.4*  ALBUMIN 5.0  AST 99*  ALT 68*  ALKPHOS 74  BILITOT 0.5   Hematology Recent Labs  Lab 06/26/19 2058  WBC 13.4*  RBC 4.74  HGB 15.8  HCT 48.6  MCV 102.5*   MCH 33.3  MCHC 32.5  RDW 12.2  PLT 236   Cardiac EnzymesNo results for input(s): TROPONINI in the last 168 hours. No results for input(s): TROPIPOC in the last 168 hours.  BNPNo results for input(s): BNP, PROBNP in the last 168 hours.  DDimer No results for input(s): DDIMER in the last 168 hours.  Radiology/Studies:  Dg Chest Portable 1 View  Result Date: 06/26/2019 CLINICAL DATA:  Shortness of breath post Narcan EXAM: PORTABLE CHEST 1 VIEW COMPARISON:  Radiograph 10/27/2016 FINDINGS: Patchy airspace consolidation and volume loss in the right upper lobe. Remaining aerated portions of the lungs are clear. No pneumothorax or effusion. Cardiomediastinal contours are unremarkable. No acute osseous or soft tissue abnormality. IMPRESSION: Patchy airspace consolidation and volume loss in the right upper lobe, could reflect a pneumonia or sequela of aspiration with atelectatic volume loss. Electronically Signed   By: Kreg Shropshire M.D.   On: 06/26/2019 23:12    Assessment and Plan:   Henry Stanley is a 43 year old man with history of PSA who presents this evening after ingestion of cocaine, heroine, and alcohol found to have multisystem organ dysfunction including AKI, acute liver injury, and elevated troponin. He has evidence of elevated lactate and metabolic acidosis.   Am concerned by cool lower extremities and end organ dysfunction. If lactate or Cr/LFTs worsen with fluid resuscitation I would be increasingly concerned for a low output state.   Will prioritize echo in the AM to better characterize systolic function and wall motion, in the interim would recommend:  --  Appreciate excellent care by medicine team.  -- Please send UA and CK to r/o rhabdo given unknown "down" time, hsTn, hyperk, and renal failure.  -- If no contraindications to anticoagulation would proceed with heparin bolus and gtt for ACS.  -- Please give 325 of ASA.  -- Please send HbA1c and lipid panel to risk stratify.  --  Please send BNP, TSH, Free T4, Utox, coags with AM labs.  -- Trend troponin to peak q 6 hours.  -- Complete TTE in the AM. We will follow up to make recommendations re: medication changes.  -- Keep NPO for now in case of clinical change, but likely not a candidate for early invasive ischemic evaluation due to metabolic derangement, AKI.   Please alert cardiology immediately if lactate is rising despite fluid resuscitation, if patient becomes hemodynamically unstable, hypoxic, or if recurrent chest pain.     For questions or updates, please contact [facility] Please consult [email or web address] for contact info under   Signed, Laurell JosephsLauren K Truby, MD  06/27/2019 2:15 AM   Addendum:  Noted lactate uptrending with Cr and LFTs. Will recommend PICC placement for Co-ox monitoring and CVP measurements. K also 7.1 Likely needs critical care and consideration of CRRT.

## 2019-06-27 NOTE — Consult Note (Signed)
NAME:  Henry Stanley, MRN:  [id number], DOB:  Aug 19, 1976, LOS: 0 ADMISSION DATE:  06/26/2019, CONSULTATION DATE:  10/16 REFERRING MD:  Dr. Janee Mornhompson, CHIEF COMPLAINT:  Kidney injury   Brief History   43 year old male with known substance abuse admitted 10/16 after overdose with renal failure and hyperkalemia due to rhabdomyolysis.   History of present illness   43 year old male with PMH as below, which is significant for polysubstance abuse, GERD, Sz. He was in his usual state of health until 10/15 when he "did some drugs" UDS positive for Benzo, opiates cocaine, THC. After a prolonged period of time his friends were able to wake him and EMS was called. Upon arrival to the ED he was somewhat more awake and complaining of chest pain. Trop elevated at 2121. Other labs significant for hyperkalemia and renal failure with CK 11000. Repeat labs showed worsening renal failure with potassium 7. Nephrology and PCCM consulted.   Past Medical History   has a past medical history of Anxiety, Cocaine abuse (HCC), Depression, GERD (gastroesophageal reflux disease), Polysubstance abuse (HCC), Seizures (HCC), and Sinus infection.  Significant Hospital Events   10/16 admit  Consults:  Nephrology PCCM Cardiology  Procedures:    Significant Diagnostic Tests:  US renal 10/16 > Small cyst upper pole left kidney.  Micro Data:    Antimicrobials:     Interim history/subjective:  Feeling better now. Chest pain improved breathing well.   Objective   Blood pressure (!) 132/92, pulse 94, temperature (!) 97.4 F (36.3 C), temperature source Oral, resp. rate 14, SpO2 95 %.        Intake/Output Summary (Last 24 hours) at 06/27/2019 1118 Last data filed at 06/27/2019 0759 Gross per 24 hour  Intake 348.22 ml  Output -  Net 348.22 ml   There were no vitals filed for this visit.  Examination: General: Middle aged adult male HENT: [place]/AT, PERRL, no JVD Lungs: Clear bilateral breath sounds  Cardiovascular: RRR, no MRG Abdomen: Soft, non-tender, non-distended Extremities: No acute deformity or ROM limitation.  Neuro: Alert, oriented, non-focal. Drowsy.   Resolved Hospital Problem list     Assessment & Plan:   Acute renal failure secondary to rhabdomyolysis. Labs have now improved and K down to 5.4. Hyperkalemia - Agree with IVF resuscitation - Nephrology following - Lokelma, bicarb, and fluids per nephrology - Hopefully he can avoid HD - Follow BMP  Chest pain:  Elevated troponin.  - management per cardiology  Substance abuse/Overdose - management per primary service.   PCCM will sign off. Please re-consult if he requires HD.   Labs   CBC: Recent Labs  Lab 06/26/19 2058 06/27/19 0446  WBC 13.4* 11.1*  HGB 15.8 15.2  HCT 48.6 45.5  MCV 102.5* 101.1*  PLT 236 204    Basic Metabolic Panel: Recent Labs  Lab 06/26/19 2058 06/27/19 0446 06/27/19 0900  NA 141 136 137  K 6.4* 7.1* 5.4*  CL 102 101 101  CO2 21* 25 21*  GLUCOSE 92 122* 100*  BUN 16 22* 23*  CREATININE 2.80* 3.21* 3.11*  CALCIUM 9.6 8.5* 8.7*  PHOS  --   --  4.2   GFR: CrCl cannot be calculated (Unknown ideal weight.). Recent Labs  Lab 06/26/19 2058 06/27/19 0446 06/27/19 0656  PROCALCITON  --  0.84  --   WBC 13.4* 11.1*  --   LATICACIDVEN  --  4.6* 4.9*    Liver Function Tests: Recent Labs  Lab 06/26/19 2058  06/27/19 0446 06/27/19 0900  AST 99* 157*  --   ALT 68* 84*  --   ALKPHOS 74 66  --   BILITOT 0.5 0.7  --   PROT 8.4* 7.5  --   ALBUMIN 5.0 4.5 3.7   No results for input(s): LIPASE, AMYLASE in the last 168 hours. No results for input(s): AMMONIA in the last 168 hours.  ABG    Component Value Date/Time   PHART 7.437 12/16/2012 0255   PCO2ART 35.7 12/16/2012 0255   PO2ART 524.0 (H) 12/16/2012 0255   HCO3 23.6 12/16/2012 0255   TCO2 25 10/25/2017 0027   ACIDBASEDEF 0.1 12/16/2012 0255   O2SAT 100.0 12/16/2012 0255     Coagulation Profile: Recent  Labs  Lab 06/27/19 0446  INR 1.0    Cardiac Enzymes: Recent Labs  Lab 06/27/19 0444  CKTOTAL 11,202*    HbA1C: Hgb A1c MFr Bld  Date/Time Value Ref Range Status  06/27/2019 04:44 AM 5.1 4.8 - 5.6 % Final    Comment:    (NOTE) Pre diabetes:          5.7%-6.4% Diabetes:              >6.4% Glycemic control for   <7.0% adults with diabetes     CBG: Recent Labs  Lab 06/27/19 0200 06/27/19 0859  GLUCAP 111* 97    Review of Systems:    Bolds are positive   Constitutional: weight loss, gain, night sweats, Fevers, chills, fatigue .  HEENT: headaches, Sore throat, sneezing, nasal congestion, post nasal drip, Difficulty swallowing, Tooth/dental problems, visual complaints visual changes, ear ache CV:  chest pain, radiates:,Orthopnea, PND, swelling in lower extremities, dizziness, palpitations, syncope.  GI  heartburn, indigestion, abdominal pain, nausea, vomiting, diarrhea, change in bowel habits, loss of appetite, bloody stools.  Resp: cough, productive:, hemoptysis, dyspnea, chest pain, pleuritic.  Skin: rash or itching or icterus GU: dysuria, change in color of urine, urgency or frequency. flank pain, hematuria  MS: joint pain or swelling. decreased range of motion paresthesia Psych: change in mood or affect. depression or anxiety.  Neuro: difficulty with speech, weakness, numbness, ataxia   Past Medical History  He,  has a past medical history of Anxiety, Cocaine abuse ([place]), Depression, GERD (gastroesophageal reflux disease), Polysubstance abuse ([place]), Seizures ([place]), and Sinus infection.   Surgical History    Past Surgical History:  Procedure Laterality Date  . FEMUR SURGERY       Social History   reports that he has been smoking cigarettes. He has been smoking about 0.50 packs per day. He uses smokeless tobacco. He reports current alcohol use. He reports current drug use. Drugs: Cocaine, Marijuana, and Benzodiazepines.   Family History   His family history  includes Hypertension in his mother.   Allergies Allergies  Allergen Reactions  . Sumatriptan Shortness Of Breath, Nausea And Vomiting, Palpitations and Other (See Comments)    Increased BP also  . Acetaminophen Other (See Comments)    reaction to migraine cocktail - rectal burning and agitation  . Benadryl [Diphenhydramine Hcl] Other (See Comments)     reaction to migraine cocktail - rectal burning and agitation     Home Medications  Prior to Admission medications   Medication Sig Start Date End Date Taking? Authorizing Provider  acetaminophen (TYLENOL) 500 MG tablet Take 1,000 mg by mouth every 6 (six) hours as needed for mild pain or headache.   Yes [provider]  bismuth subsalicylate (PEPTO BISMOL) 262 MG/15ML suspension  Take 30 mLs by mouth every 6 (six) hours as needed for indigestion or diarrhea or loose stools.   Yes [provider]      Joneen Roach, AGACNP-BC [facility]Critical [facility] Pager [phone] or [phone]  06/27/2019 12:07 PM

## 2019-06-27 NOTE — Consult Note (Signed)
[facility] Renal Consultation Note  Requesting MD: Grandville Silos Indication for Consultation: AKI  HPI:  Henry Stanley is a 43 y.o. male with Pmhx significant for anxiety and polysubstance abuse.  He took both cocaine and heroin last night.  After that was difficult to arouse per his friends.  When did wake up- had nausea and vomiting so brought to ER.  Iniitial labs showed crt of 2.8 and K of 6.4 (crt 1.2 in Feb 2019) He was felt to be maybe dry-  Soft BP- given fluids and short term treatment for K including 10 of lokelma.  Later labs showing troponin of 2100 then 3000.  Hemodynamics improved.  [place] 84[address].  Then labs done close to 5 AM showed crt of 3.2 and K of 7.1 so we were called.  Pt is sitting up on the bedside, c/o nausea.  Was quite shocked to hear that his heart and kidneys were showing signs of damage.  "am in dying? " BP 121/92.  Appears to have been using NSAIDS as well PTA.  Has made some dark urine in the ER-  U/A- 100 of protein, granular casts, 0-5 RBC- 11-20 WBC  Creatinine, Ser  Date/Time Value Ref Range Status  06/27/2019 04:46 AM 3.21 (H) 0.61 - 1.24 mg/dL Final  06/26/2019 08:58 PM 2.80 (H) 0.61 - 1.24 mg/dL Final  10/25/2017 12:27 AM 1.20 0.61 - 1.24 mg/dL Final  10/25/2017 12:20 AM 1.18 0.61 - 1.24 mg/dL Final  07/13/2017 10:28 PM 1.27 (H) 0.61 - 1.24 mg/dL Final  02/19/2017 07:17 PM 1.24 0.61 - 1.24 mg/dL Final  10/27/2016 04:13 AM 1.15 0.61 - 1.24 mg/dL Final  01/31/2016 12:15 AM 1.20 0.61 - 1.24 mg/dL Final  01/04/2016 02:42 PM 1.00 0.61 - 1.24 mg/dL Final  12/20/2015 10:12 PM 1.08 0.61 - 1.24 mg/dL Final  11/14/2014 07:17 PM 1.20 0.50 - 1.35 mg/dL Final  10/29/2014 07:17 AM 1.11 0.50 - 1.35 mg/dL Final  08/20/2014 01:18 AM 1.33 0.50 - 1.35 mg/dL Final  06/27/2014 10:36 AM 1.00 0.50 - 1.35 mg/dL Final  06/23/2014 04:46 AM 1.14 0.50 - 1.35 mg/dL Final  01/31/2014 06:45 PM 1.13 0.50 - 1.35 mg/dL Final  01/19/2014 03:28 AM 1.13 0.50 - 1.35 mg/dL  Final  01/18/2014 04:09 AM 1.12 0.50 - 1.35 mg/dL Final  01/08/2014 03:06 AM 1.18 0.50 - 1.35 mg/dL Final  11/22/2013 04:10 AM 1.13 0.50 - 1.35 mg/dL Final  10/23/2013 12:42 AM 1.18 0.50 - 1.35 mg/dL Final  07/15/2013 02:29 PM 1.13 0.50 - 1.35 mg/dL Final  06/11/2013 08:31 PM 1.04 0.50 - 1.35 mg/dL Final  03/27/2013 06:30 AM 1.01 0.50 - 1.35 mg/dL Final  02/08/2013 09:00 AM 1.19 0.50 - 1.35 mg/dL Final  12/16/2012 04:48 AM 0.98 0.50 - 1.35 mg/dL Final  12/16/2012 04:30 AM 0.95 0.50 - 1.35 mg/dL Final  12/15/2012 11:22 PM 1.10 0.50 - 1.35 mg/dL Final  12/15/2012 10:59 PM 1.14 0.50 - 1.35 mg/dL Final  03/11/2012 04:07 PM 0.99 0.50 - 1.35 mg/dL Final  09/22/2011 02:45 AM 1.00 0.50 - 1.35 mg/dL Final  08/13/2011 01:42 PM 1.00 0.50 - 1.35 mg/dL Final  01/23/2011 03:37 AM 0.93 0.4 - 1.5 mg/dL Final  01/22/2011 04:28 AM 1.06 0.4 - 1.5 mg/dL Final  07/28/2010 02:08 PM 1.17 0.4 - 1.5 mg/dL Final  05/11/2010 04:05 AM 1.3 0.4 - 1.5 mg/dL Final  05/11/2010 03:56 AM 1.16 0.4 - 1.5 mg/dL Final  05/01/2010 07:36 AM 1.1 0.4 - 1.5 mg/dL Final  03/21/2010 04:27 AM  1.4 0.4 - 1.5 mg/dL Final  03/04/7627 31:51 AM 0.92 0.4 - 1.5 mg/dL Final  76/16/0737 10:62 PM 1.26 0.4 - 1.5 mg/dL Final  69/48/5462 70:35 AM 1.0 0.4 - 1.5 mg/dL Final  00/93/8182 99:37 PM 1.16 0.4 - 1.5 mg/dL Final     PMHx:   Past Medical History:  Diagnosis Date  . Anxiety   . Cocaine abuse (HCC)   . Depression   . GERD (gastroesophageal reflux disease)   . Polysubstance abuse (HCC)   . Seizures (HCC)   . Sinus infection     Past Surgical History:  Procedure Laterality Date  . FEMUR SURGERY      Family Hx:  Family History  Problem Relation Age of Onset  . Hypertension Mother     Social History:  reports that he has been smoking cigarettes. He has been smoking about 0.50 packs per day. He uses smokeless tobacco. He reports current alcohol use. He reports current drug use. Drugs: Cocaine, Marijuana, and  Benzodiazepines.  Allergies:  Allergies  Allergen Reactions  . Sumatriptan Shortness Of Breath, Nausea And Vomiting, Palpitations and Other (See Comments)    Increased BP also  . Acetaminophen Other (See Comments)    reaction to migraine cocktail - rectal burning and agitation  . Benadryl [Diphenhydramine Hcl] Other (See Comments)     reaction to migraine cocktail - rectal burning and agitation    Medications: Prior to Admission medications   Medication Sig Start Date End Date Taking? Authorizing Provider  amoxicillin-clavulanate (AUGMENTIN) 875-125 MG tablet Take 1 tablet by mouth every 12 (twelve) hours. 11/12/18   Dahlia Byes A, NP  cetirizine (EQ ALLERGY RELIEF, CETIRIZINE,) 10 MG tablet Take 1 tablet (10 mg total) by mouth daily. 07/31/17   Caccavale, Sophia, PA-C  cetirizine-pseudoephedrine (ZYRTEC-D) 5-120 MG tablet Take 1 tablet by mouth daily. 11/12/18   Bast, Gloris Manchester A, NP  fluticasone (FLONASE) 50 MCG/ACT nasal spray Place 1 spray into both nostrils daily. 11/12/18   Dahlia Byes A, NP  ibuprofen (ADVIL,MOTRIN) 200 MG tablet Take 400 mg by mouth every 6 (six) hours as needed for headache or mild pain.    [provider]  methocarbamol (ROBAXIN) 500 MG tablet Take 1 tablet (500 mg total) by mouth 2 (two) times daily. 10/25/17   Palumbo, April, MD  naproxen (NAPROSYN) 375 MG tablet Take 1 tablet (375 mg total) by mouth 2 (two) times daily with a meal. 10/25/17   Palumbo, April, MD  oxymetazoline (AFRIN) 0.05 % nasal spray Place 1 spray into both nostrils 2 (two) times daily as needed for congestion.    [provider]    I have reviewed the patient's current medications.  Labs:  Results for orders placed or performed during the hospital encounter of 06/26/19 (from the past 48 hour(s))  Comprehensive metabolic panel     Status: Abnormal   Collection Time: 06/26/19  8:58 PM  Result Value Ref Range   Sodium 141 135 - 145 mmol/L   Potassium 6.4 (HH) 3.5 - 5.1 mmol/L     Comment: NO VISIBLE HEMOLYSIS CRITICAL RESULT CALLED TO, READ BACK BY AND VERIFIED WITH: M.[facility] RN 2244 06/26/2019 MCCORMICK K    Chloride 102 98 - 111 mmol/L   CO2 21 (L) 22 - 32 mmol/L   Glucose, Bld 92 70 - 99 mg/dL   BUN 16 6 - 20 mg/dL   Creatinine, Ser 1.69 (H) 0.61 - 1.24 mg/dL   Calcium 9.6 8.9 - 67.8 mg/dL   Total  Protein 8.4 (H) 6.5 - 8.1 g/dL   Albumin 5.0 3.5 - 5.0 g/dL   AST 99 (H) 15 - 41 U/L   ALT 68 (H) 0 - 44 U/L   Alkaline Phosphatase 74 38 - 126 U/L   Total Bilirubin 0.5 0.3 - 1.2 mg/dL   GFR calc non Af Amer 26 (L) >60 mL/min   GFR calc Af Amer 31 (L) >60 mL/min   Anion gap 18 (H) 5 - 15    Comment: Performed at [facility] Lab, [address], [place], [place] [address]  Ethanol     Status: None   Collection Time: 06/26/19  8:58 PM  Result Value Ref Range   Alcohol, Ethyl (B) <10 <10 mg/dL    Comment: (NOTE) Lowest detectable limit for serum alcohol is 10 mg/dL. For medical purposes only. Performed at [facility] Lab, [address], [place], [place] [address]   cbc     Status: Abnormal   Collection Time: 06/26/19  8:58 PM  Result Value Ref Range   WBC 13.4 (H) 4.0 - 10.5 K/uL   RBC 4.74 4.22 - 5.81 MIL/uL   Hemoglobin 15.8 13.0 - 17.0 g/dL   HCT 29.5 62.1 - 30.8 %   MCV 102.5 (H) 80.0 - 100.0 fL   MCH 33.3 26.0 - 34.0 pg   MCHC 32.5 30.0 - 36.0 g/dL   RDW 65.7 84.6 - 96.2 %   Platelets 236 150 - 400 K/uL   nRBC 0.0 0.0 - 0.2 %    Comment: Performed at [facility] Lab, [address], [place], [place] [address]  Troponin I (High Sensitivity)     Status: Abnormal   Collection Time: 06/26/19 11:55 PM  Result Value Ref Range   Troponin I (High Sensitivity) 2,121 (HH) <18 ng/L    Comment: CRITICAL RESULT CALLED TO, READ BACK BY AND VERIFIED WITH: FLORES,M RN 06/27/2019 0047 JORDANS (NOTE) Elevated high sensitivity troponin I (hsTnI) values and significant  changes across serial measurements may suggest ACS but many other  chronic and  acute conditions are known to elevate hsTnI results.  Refer to the Links section for chest pain algorithms and additional  guidance. Performed at [facility] Lab, [address], [place], [place] [address]   Troponin I (High Sensitivity)     Status: Abnormal   Collection Time: 06/27/19  1:32 AM  Result Value Ref Range   Troponin I (High Sensitivity) 3,005 (HH) <18 ng/L    Comment: CRITICAL VALUE NOTED.  VALUE IS CONSISTENT WITH PREVIOUSLY REPORTED AND CALLED VALUE. (NOTE) Elevated high sensitivity troponin I (hsTnI) values and significant  changes across serial measurements may suggest ACS but many other  chronic and acute conditions are known to elevate hsTnI results.  Refer to the Links section for chest pain algorithms and additional  guidance. Performed at [facility] Lab, [address] [place], [place] [address]   CBG monitoring, ED     Status: Abnormal   Collection Time: 06/27/19  2:00 AM  Result Value Ref Range   Glucose-Capillary 111 (H) 70 - 99 mg/dL  Strep pneumoniae urinary antigen     Status: None   Collection Time: 06/27/19  3:40 AM  Result Value Ref Range   Strep Pneumo Urinary Antigen NEGATIVE NEGATIVE    Comment:        Infection due to S. pneumoniae cannot be absolutely ruled out since the antigen present may be below the detection limit of the  test. Performed at [facility] Lab, [address] [place], [place] [address]   Hemoglobin A1c     Status: None   Collection Time: 06/27/19  4:44 AM  Result Value Ref Range   Hgb A1c MFr Bld 5.1 4.8 - 5.6 %    Comment: (NOTE) Pre diabetes:          5.7%-6.4% Diabetes:              >6.4% Glycemic control for   <7.0% adults with diabetes    Mean Plasma Glucose 99.67 mg/dL    Comment: Performed at [facility] Lab, [address], [place], [place] [address]  T4, free     Status: None   Collection Time: 06/27/19  4:44 AM  Result Value Ref Range   Free T4 0.69 0.61 - 1.12 ng/dL    Comment:  (NOTE) Biotin ingestion may interfere with free T4 tests. If the results are inconsistent with the TSH level, previous test results, or the clinical presentation, then consider biotin interference. If needed, order repeat testing after stopping biotin. Performed at [facility] Lab, [address], [place], [place] [address]   CK     Status: Abnormal   Collection Time: 06/27/19  4:44 AM  Result Value Ref Range   Total CK 11,202 (H) 49 - 397 U/L    Comment: RESULTS CONFIRMED BY MANUAL DILUTION Performed at [facility] Lab, [address] [place], [place] [address]   Lipid panel     Status: Abnormal   Collection Time: 06/27/19  4:45 AM  Result Value Ref Range   Cholesterol 171 0 - 200 mg/dL   Triglycerides 75 <295 mg/dL   HDL 53 >62 mg/dL   Total CHOL/HDL Ratio 3.2 RATIO   VLDL 15 0 - 40 mg/dL   LDL Cholesterol 130 (H) 0 - 99 mg/dL    Comment:        Total Cholesterol/HDL:CHD Risk Coronary Heart Disease Risk Table                     Men   Women  1/2 Average Risk   3.4   3.3  Average Risk       5.0   4.4  2 X Average Risk   9.6   7.1  3 X Average Risk  23.4   11.0        Use the calculated Patient Ratio above and the CHD Risk Table to determine the patient's CHD Risk.        ATP III CLASSIFICATION (LDL):  <100     mg/dL   Optimal  865-784  mg/dL   Near or Above                    Optimal  130-159  mg/dL   Borderline  696-295  mg/dL   High  >284     mg/dL   Very High Performed at [facility] Lab, [address] [place], [place] [address]   Brain natriuretic peptide     Status: Abnormal   Collection Time: 06/27/19  4:45 AM  Result Value Ref Range   B Natriuretic Peptide 343.2 (H) 0.0 - 100.0 pg/mL    Comment: Performed at [facility], [address], [place], [place] [address]  TSH     Status: None   Collection Time: 06/27/19  4:45 AM  Result Value Ref Range   TSH 0.642 0.350 -  4.500 uIU/mL    Comment: Performed by a 3rd Generation assay with a  functional sensitivity of <=0.01 uIU/mL. Performed at [facility] Lab, [address], [place], [place] [address]   Hepatic function panel     Status: Abnormal   Collection Time: 06/27/19  4:46 AM  Result Value Ref Range   Total Protein 7.5 6.5 - 8.1 g/dL   Albumin 4.5 3.5 - 5.0 g/dL   AST 604 (H) 15 - 41 U/L   ALT 84 (H) 0 - 44 U/L   Alkaline Phosphatase 66 38 - 126 U/L   Total Bilirubin 0.7 0.3 - 1.2 mg/dL   Bilirubin, Direct <5.4 0.0 - 0.2 mg/dL   Indirect Bilirubin NOT CALCULATED 0.3 - 0.9 mg/dL    Comment: Performed at [facility] Lab, [address] [place], [place] [address]  Basic metabolic panel     Status: Abnormal   Collection Time: 06/27/19  4:46 AM  Result Value Ref Range   Sodium 136 135 - 145 mmol/L   Potassium 7.1 (HH) 3.5 - 5.1 mmol/L    Comment: NO VISIBLE HEMOLYSIS CRITICAL RESULT CALLED TO, READ BACK BY AND VERIFIED WITH: M FLOREZ,RN 1016 [id number] WILDERK    Chloride 101 98 - 111 mmol/L   CO2 25 22 - 32 mmol/L   Glucose, Bld 122 (H) 70 - 99 mg/dL   BUN 22 (H) 6 - 20 mg/dL   Creatinine, Ser 9.14 (H) 0.61 - 1.24 mg/dL   Calcium 8.5 (L) 8.9 - 10.3 mg/dL   GFR calc non Af Amer 22 (L) >60 mL/min   GFR calc Af Amer 26 (L) >60 mL/min   Anion gap 10 5 - 15    Comment: Performed at [facility] Lab, [address], [place], [place] [address]  CBC     Status: Abnormal   Collection Time: 06/27/19  4:46 AM  Result Value Ref Range   WBC 11.1 (H) 4.0 - 10.5 K/uL   RBC 4.50 4.22 - 5.81 MIL/uL   Hemoglobin 15.2 13.0 - 17.0 g/dL   HCT 62.1 30.8 - 65.7 %   MCV 101.1 (H) 80.0 - 100.0 fL   MCH 33.8 26.0 - 34.0 pg   MCHC 33.4 30.0 - 36.0 g/dL   RDW 84.6 96.2 - 95.2 %   Platelets 204 150 - 400 K/uL   nRBC 0.0 0.0 - 0.2 %    Comment: Performed at [facility] Lab, [address], [place], [place] [address]  Lactic acid, plasma     Status: Abnormal   Collection Time: 06/27/19  4:46 AM  Result Value Ref Range   Lactic Acid, Venous 4.6 (HH) 0.5 - 1.9 mmol/L     Comment: CRITICAL RESULT CALLED TO, READ BACK BY AND VERIFIED WITH: Virgel Manifold [id number] [facility] Performed at [facility] Lab, [address] [place], [place] [address]   Protime-INR     Status: None   Collection Time: 06/27/19  4:46 AM  Result Value Ref Range   Prothrombin Time 13.5 11.4 - 15.2 seconds   INR 1.0 0.8 - 1.2    Comment: (NOTE) INR goal varies based on device and disease states. Performed at [facility] Lab, [address] [place], [place] [address]   APTT     Status: Abnormal   Collection Time: 06/27/19  4:46 AM  Result Value Ref Range   aPTT 22 (L) 24 - 36 seconds    Comment: Performed at [facility], [address] [place], [place] [id number]  Procalcitonin     Status: None   Collection Time: 06/27/19  4:46 AM  Result Value Ref Range   Procalcitonin 0.84 ng/mL    Comment:        Interpretation: PCT > 0.5 ng/mL and <= 2 ng/mL: Systemic infection (sepsis) is possible, but other conditions are known to elevate PCT as well. (NOTE)       Sepsis PCT Algorithm           Lower Respiratory Tract                                      Infection PCT Algorithm    ----------------------------     ----------------------------         PCT < 0.25 ng/mL                PCT < 0.10 ng/mL         Strongly encourage             Strongly discourage   discontinuation of antibiotics    initiation of antibiotics    ----------------------------     -----------------------------       PCT 0.25 - 0.50 ng/mL            PCT 0.10 - 0.25 ng/mL               OR       >80% decrease in PCT            Discourage initiation of                                            antibiotics      Encourage discontinuation           of antibiotics    ----------------------------     -----------------------------         PCT >= 0.50 ng/mL              PCT 0.26 - 0.50 ng/mL                AND       <80% decrease in PCT             Encourage initiation of                                              antibiotics       Encourage continuation           of antibiotics    ----------------------------     -----------------------------        PCT >= 0.50 ng/mL                  PCT > 0.50 ng/mL               AND         increase in PCT                  Strongly encourage  initiation of antibiotics    Strongly encourage escalation           of antibiotics                                     -----------------------------                                           PCT <= 0.25 ng/mL                                                 OR                                        > 80% decrease in PCT                                     Discontinue / Do not initiate                                             antibiotics Performed at [facility] Lab, [address] [place], [place] [address]   Rapid urine drug screen (hospital performed)     Status: Abnormal   Collection Time: 06/27/19  5:01 AM  Result Value Ref Range   Opiates POSITIVE (A) NONE DETECTED   Cocaine POSITIVE (A) NONE DETECTED   Benzodiazepines POSITIVE (A) NONE DETECTED   Amphetamines NONE DETECTED NONE DETECTED   Tetrahydrocannabinol POSITIVE (A) NONE DETECTED   Barbiturates NONE DETECTED NONE DETECTED    Comment: (NOTE) DRUG SCREEN FOR MEDICAL PURPOSES ONLY.  IF CONFIRMATION IS NEEDED FOR ANY PURPOSE, NOTIFY LAB WITHIN 5 DAYS. LOWEST DETECTABLE LIMITS FOR URINE DRUG SCREEN Drug Class                     Cutoff (ng/mL) Amphetamine and metabolites    1000 Barbiturate and metabolites    200 Benzodiazepine                 200 Tricyclics and metabolites     300 Opiates and metabolites        300 Cocaine and metabolites        300 THC                            50 Performed at [facility] Lab, [address], [place], [place] [address]   Urinalysis, Routine w reflex microscopic     Status: Abnormal   Collection Time: 06/27/19  5:01 AM  Result Value Ref Range   Color,  Urine YELLOW YELLOW   APPearance CLOUDY (A) CLEAR   Specific Gravity, Urine 1.015 1.005 - 1.030   pH 5.0 5.0 - 8.0   Glucose, UA 50 (A) NEGATIVE mg/dL   Hgb urine dipstick LARGE (A) NEGATIVE   Bilirubin Urine NEGATIVE  NEGATIVE   Ketones, ur NEGATIVE NEGATIVE mg/dL   Protein, ur 132 (A) NEGATIVE mg/dL   Nitrite NEGATIVE NEGATIVE   Leukocytes,Ua NEGATIVE NEGATIVE   RBC / HPF 0-5 0 - 5 RBC/hpf   WBC, UA 11-20 0 - 5 WBC/hpf   Bacteria, UA FEW (A) NONE SEEN   Squamous Epithelial / LPF 0-5 0 - 5   Mucus PRESENT    Hyaline Casts, UA PRESENT    Granular Casts, UA PRESENT     Comment: Performed at [facility] Lab, [address], [place], [place] [address]     ROS:  A comprehensive review of systems was negative except for: Respiratory: positive for dyspnea on exertion - also nausea and vomiting  Physical Exam: Vitals:   06/27/19 0330 06/27/19 0400  BP: 137/85 (!) 135/53  Pulse: 94 91  Resp: 10 12  Temp:    SpO2: 92% 97%     General: alert, well nourished, sitting on side of bed-  In distress secondary to nausea HEENT: PERRLA, EOMI, mucous membranes moist Neck: no JVD Heart: RRR- tachy  Lungs:mostly clear Abdomen: soft, non tender Extremities: no edema Skin: warm and dry  Neuro: alert, slightly slowed likely impact of drug ingestion   Assessment/Plan: 43 year old BM s/p ingestion of cocaine and heroin last evening as well as NSAID use.  Now presents with AKI and elevated CK and troponin  1.Renal- no known baseline CKD.  Now after drug ingestion has AKI with hyperkalemia.  Likely hemodynamic insult with granular casts and elevated lactate but also with features of rhabdomyolysis.  Agree with short term and long term attempts at correction of potassium.  Will ramp up the lokelma and also start some high dose bicarb based fluids for the rhabdo.  I hope that we might be able to avoid dialytic support in this patient but am not sure.  The potassium will be the key.  Will check labs  in a few hours and follow him closely.  If he did require HD he seems right now stable for intermittent HD but his status could change to where CRRT is required  2. Hypertension/volume  - blood pressure is reasonable.  Going to be giving IVF to assist with rhabdo but volume status if anything seems dry at this time 3. Hyperkalemia-  Received insulin/bicarb/calcium/albuterol , kaexylate and lokelma- will schedule lokelma and recheck in a few hours   Grey Schlauch A Brieonna Crutcher 06/27/2019, 7:11 AM

## 2019-06-27 NOTE — Progress Notes (Signed)
CSW met with patient at bedside to complete discussion regarding his current substance use and overdose yesterday. Patient was receptive to CSW and was agreeable to discuss his use. Patient reports he buys Xanax and Klonapin off the streets and takes one '1mg'$  pill daily. Patient reports he is a frequent marijuana user, stating he smokes daily. Patient reports he used heroin for the first time yesterday, stating he snorted approximately 1 gram. Patient reports he uses cocaine on a weekly basis, stating that 1 gram lasts him approximately 4 to 5 hours. Patient states he has had chest pain in the past when he is under the influence of cocaine but never as bad as yesterday. Patient reports he was not intentionally trying to overdose that it was an accident. Patient reports he will not use heroin anymore because of the experience he had yesterday. CSW inquired about the patient's mental health history, he confirmed the depression diagnosis in his chart. Patient reports he has never received any treatment for his depression. CSW and patient discussed resources for receiving mental health treatment and he stated he would look into it after he got out of the hospital. Patient reports he has three children - all boys, ages 65, 38, and 2. Patient reports he has a girlfriend also. Patient reports his main residence is with his mother on [address] but that he "stays" with his girlfriend most of the time. Patient reports his children are all safe with their mothers. Patient denies that he is addicted to any of the substances he uses. CSW was provided with various resources including substance use and mental health. CSW strongly emphasized the importance of mental health in regards to his physical health and encouraged him to please reach out for assistance or support if it is needed.  Patient is being admitted to the hospital. Please reconsult CSW if additional needs arise.  Madilyn Fireman, MSW, LCSW-A Transitions of Care   Clinical Social Worker  [facility] Emergency Departments  Medical ICU [phone]

## 2019-06-27 NOTE — ED Provider Notes (Signed)
I called report to Dr. Blaine Hamper for admission.  He requested cardiology consultation due to elevated troponin.  I discussed with Dr. Marletta Lor with cardiology.  She will see the patient.  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 Patient resting comfortably at this time.  He has been getting treated for hyperkalemia   Ripley Fraise, MD 06/27/19 [phone]

## 2019-06-27 NOTE — Progress Notes (Signed)
PROGRESS NOTE    Henry Stanley  IFO:[id number] DOB: April 07, 1976 DOA: 06/26/2019 PCP: Patient, No Pcp Per    Brief Narrative:  HPI per Dr. Angelina Sheriff D Vetsch is a 43 y.o. male with medical history significant of polysubstance abuse (cocaine, marijuana, benzo, tobacco), CKD-2, GERD, depression, who presents with drug overdose.  Pt states that he snorted 1 gram of cocaine and heroine yesterday evening, then he fell asleep for for a long time. Hiscompanions were reportedly worried because they were unable to wake him, but pt continues to have breathing.  After he woke up, he developed nausea and vomited few times. No AP or diarrhea. Patient developed chest pain. He denies shortness of breath to me.  The chest pain is located in substernal area, pressure like, 8 out of 10 severity, nonradiating. Patient states that he has cough with mixed blood for several times.  No fever or chills.  Denies symptoms of UTI or unilateral weakness. Pt was given 0.4 mg of Narcan in ED. When I saw pt in ED, he is mildly drowsy, but is oriented x 3.  Per ED physician, patient had oxygen desaturation to 84% on room air initially, currently 96% on room air.  ED Course: pt was found to have troponin 2121, WBC 13.4, alcohol level less than 10, pending COVID-19 test, potassium 6.4, worsening renal function, hypothermia with body temperature 97.4, blood pressure 22/93, tachycardia, oxygen saturation 93 to 96% on room air.  Chest X showed patchy airspace disease in the right upper lobe.  Patient is admitted to stepdown as inpatient and PUI.  Cardiology, Dr. Marletta Lor was consulted.  Assessment & Plan:   Principal Problem:   Drug overdose Active Problems:   Acute respiratory failure (HCC)   Depression   Polysubstance abuse (HCC)   Hyperkalemia   Elevated troponin   Abnormal LFTs   Hypothermia   Acute renal failure superimposed on stage 2 chronic kidney disease (HCC)   Lobar pneumonia ([place])  1 unintentional drug overdose  Patient with a history of polysubstance abuse including tobacco, cocaine, heroin, benzos.  Patient currently alert following commands.  Patient denies any intentional drug overdose.  Patient in ED currently awaiting to go to stepdown/progressive unit.  UDS done positive for benzos, opiates, cocaine, THC.  Alcohol level was less than 10.  Polysubstance cessation stressed to patient.  Social work consulted.  Will place on the clonidine detox protocol.  Follow.  2.  Acute respiratory failure secondary to probable lobar pneumonia/sepsis and drug overdose. Patient had presented with hemoptysis felt likely secondary to pneumonia and drug abuse.  Low suspicion for PE.  Patient met criteria for sepsis with leukocytosis, tachycardia, tachypnea and hypothermia, elevated lactic acid level and acute renal failure with hyperkalemia.  Patient was placed on the bear hugger with improvement with hypothermia.  Urine cultures pending.  Blood cultures pending.  Respiratory panel by PCR pending.  SARS coronavirus noted to be negative.  Will discontinue airborne precautions.  Continue empiric IV Rocephin and azithromycin.  Follow.  3.  Acute renal failure Questionable etiology.  Last creatinine noted to be 1.22 on 10/25/2017.  Creatinine on admission was 2.80 which worsened and went up as high as 3.21.  Concern for possible ATN secondary to acute rhabdomyolysis and hemodynamic insult.  Patient noted to have a elevated lactic acid level with concerns for possible sepsis.  Seen in consultation by nephrology and patient started on high-dose bicarb as well as IV fluids in addition to scheduled Lokelma.  Serial  blood work.  Nephrology following and appreciate input and recommendations.  4.  Hyperkalemia Likely secondary to problem #4.  Patient already received insulin, bicarb, calcium and albuterol as well as Kayexalate and Lokelma.  Patient placed on scheduled Lokelma.  Nephrology.  Appreciate nephrology input and recommendations.   5.  Elevated troponin/NSTEMI Patient noted to have elevated troponin which went up from 212 1-3 005 and now at 2194.  Total CK of 07/13/2001.  BNP of 343.2.  Patient noted to have some chest pain on admission however improvement with chest pain.  2D echo done with a EF of 60 to 65%, mildly increased LVH, no wall motion abnormalities.  Patient seen in consultation by cardiology who are following.  IV heparin not started due to hemoptysis per patient.  With cardiology input and recommendations.  6.  Transaminitis Questionable etiology.  Acute hepatitis panel pending.  HIV nonreactive.  Patient on IV fluids.  Repeat labs in the morning.  7.  Hypothermia Questionable etiology.  Patient pancultured.  Patient on empiric IV antibiotics.  Currently on [facility].  Follow.  8.  Depression   DVT prophylaxis: SCDs Code Status: Full Family Communication: Updated patient.  No family at bedside. Disposition Plan: To be determined.  Likely home when clinically improved and when okay with nephrology and cardiology.   Consultants:   Nephrology: Dr. Moshe Cipro 06/27/2019  PCCM: Pending  Cardiology: Dr. Marletta Lor 06/27/2019  Procedures:   Chest x-ray 06/26/2019  Renal ultrasound 06/27/2019  2D echo 06/27/2019  Antimicrobials:   IV azithromycin 06/27/2019  IV Rocephin 06/27/2019   Subjective: Patient laying in bed.  States some improvement with shortness of breath and chest pain since admission.  No nausea or vomiting.  Patient does note to have some hemoptysis early on this morning on presentation to the ED.  Objective: Vitals:   06/27/19 0330 06/27/19 0400 06/27/19 0700 06/27/19 0800  BP: 137/85 (!) 135/53 (!) 121/92 133/74  Pulse: 94 91 98 98  Resp: 10 12 (!) 22 16  Temp:      TempSrc:      SpO2: 92% 97% 98% 91%    Intake/Output Summary (Last 24 hours) at 06/27/2019 0917 Last data filed at 06/27/2019 0759 Gross per 24 hour  Intake 348.22 ml  Output -  Net 348.22 ml    There were no vitals filed for this visit.  Examination:  General exam: Appears calm and comfortable  Respiratory system: Clear to auscultation. Respiratory effort normal. Cardiovascular system: S1 & S2 heard, RRR. No JVD, murmurs, rubs, gallops or clicks. No pedal edema. Gastrointestinal system: Abdomen is nondistended, soft and nontender. No organomegaly or masses felt. Normal bowel sounds heard. Central nervous system: Alert and oriented. No focal neurological deficits. Extremities: Symmetric 5 x 5 power. Skin: No rashes, lesions or ulcers Psychiatry: Judgement and insight appear normal. Mood & affect appropriate.     Data Reviewed: I have personally reviewed following labs and imaging studies  CBC: Recent Labs  Lab 06/26/19 2058 06/27/19 0446  WBC 13.4* 11.1*  HGB 15.8 15.2  HCT 48.6 45.5  MCV 102.5* 101.1*  PLT 236 173   Basic Metabolic Panel: Recent Labs  Lab 06/26/19 2058 06/27/19 0446  NA 141 136  K 6.4* 7.1*  CL 102 101  CO2 21* 25  GLUCOSE 92 122*  BUN 16 22*  CREATININE 2.80* 3.21*  CALCIUM 9.6 8.5*   GFR: CrCl cannot be calculated (Unknown ideal weight.). Liver Function Tests: Recent Labs  Lab 06/26/19 2058 06/27/19 0446  AST 99* 157*  ALT 68* 84*  ALKPHOS 74 66  BILITOT 0.5 0.7  PROT 8.4* 7.5  ALBUMIN 5.0 4.5   No results for input(s): LIPASE, AMYLASE in the last 168 hours. No results for input(s): AMMONIA in the last 168 hours. Coagulation Profile: Recent Labs  Lab 06/27/19 0446  INR 1.0   Cardiac Enzymes: Recent Labs  Lab 06/27/19 0444  CKTOTAL 11,202*   BNP (last 3 results) No results for input(s): PROBNP in the last 8760 hours. HbA1C: Recent Labs    06/27/19 0444  HGBA1C 5.1   CBG: Recent Labs  Lab 06/27/19 0200 06/27/19 0859  GLUCAP 111* 97   Lipid Profile: Recent Labs    06/27/19 0445  CHOL 171  HDL 53  LDLCALC 103*  TRIG 75  CHOLHDL 3.2   Thyroid Function Tests: Recent Labs    06/27/19 0444 06/27/19  0445  TSH  --  0.642  FREET4 0.69  --    Anemia Panel: No results for input(s): VITAMINB12, FOLATE, FERRITIN, TIBC, IRON, RETICCTPCT in the last 72 hours. Sepsis Labs: Recent Labs  Lab 06/27/19 0446 06/27/19 0656  PROCALCITON 0.84  --   LATICACIDVEN 4.6* 4.9*    No results found for this or any previous visit (from the past 240 hour(s)).       Radiology Studies: US Renal  Result Date: 06/27/2019 CLINICAL DATA:  Acute kidney injury EXAM: RENAL / URINARY TRACT ULTRASOUND COMPLETE COMPARISON:  None. FINDINGS: Right Kidney: Renal measurements: 11.0 x 4.8 x 5.6 cm = volume: 154.9 mL . Echogenicity and renal cortical thickness are within normal limits. No mass, perinephric fluid, or hydronephrosis visualized. No sonographically demonstrable calculus or ureterectasis. Left Kidney: Renal measurements: 10.9 x 5.8 x 5.0 cm = volume: 165.1 mL. Echogenicity and renal cortical thickness are within normal limits. No perinephric fluid or hydronephrosis visualized. There is a cyst arising from the upper pole left kidney measuring 1.0 x 1.0 x 0.9 cm. No sonographically demonstrable calculus or ureterectasis. Bladder: Appears normal for degree of bladder distention. IMPRESSION: Small cyst upper pole left kidney.  Study otherwise unremarkable. Electronically Signed   By: Lowella Grip III M.D.   On: 06/27/2019 08:13   Dg Chest Portable 1 View  Result Date: 06/26/2019 CLINICAL DATA:  Shortness of breath post Narcan EXAM: PORTABLE CHEST 1 VIEW COMPARISON:  Radiograph 10/27/2016 FINDINGS: Patchy airspace consolidation and volume loss in the right upper lobe. Remaining aerated portions of the lungs are clear. No pneumothorax or effusion. Cardiomediastinal contours are unremarkable. No acute osseous or soft tissue abnormality. IMPRESSION: Patchy airspace consolidation and volume loss in the right upper lobe, could reflect a pneumonia or sequela of aspiration with atelectatic volume loss. Electronically  Signed   By: Lovena Le M.D.   On: 06/26/2019 23:12        Scheduled Meds: . aspirin EC  325 mg Oral Daily  . atorvastatin  40 mg Oral q1800  . nicotine  21 mg Transdermal Daily  . sodium zirconium cyclosilicate  10 g Oral TID   Continuous Infusions: . sodium chloride 100 mL/hr at 06/27/19 0825  . azithromycin Stopped (06/27/19 0728)  . cefTRIAXone (ROCEPHIN)  IV Stopped (06/27/19 0541)  .  sodium bicarbonate (isotonic) infusion in sterile water 125 mL/hr at 06/27/19 0826     LOS: 0 days    Time spent: 40 mins  No charge.    Irine Seal, MD [facility]  If 7PM-7AM, please contact night-coverage [email or web address] 06/27/2019, 9:17 AM

## 2019-06-27 NOTE — Plan of Care (Signed)

## 2019-06-27 NOTE — Progress Notes (Signed)
  Echocardiogram 2D Echocardiogram has been performed.  Henry Stanley 06/27/2019, 9:07 AM

## 2019-06-27 NOTE — ED Notes (Signed)
Lunch Tray Ordered @ 1324-per Davene Costain, RN called by Levada Dy

## 2019-06-27 NOTE — Progress Notes (Signed)
Brief cardiology update note:  Seen overnight by the fellow. Echo returned with preserved EF.  He has multisystem organ dysfunction in the setting of polysubstance abuse, but thankfully his LV function has remained preserved.  No urgent indications for cath at this time. Recommend continuation of management as you are doing. No cardiac specific medications needed at this time. HsTn downtrending, no need to continue to trend unless new clinical concerns develop. Elevated troponin levels not unexpected given the degree of his critical illness.  Buford Dresser, MD, PhD [facility]  [address] [place],  [phone] [phone]

## 2019-06-27 NOTE — H&P (Signed)
History and Physical    Henry MottsKywan D Stanley WUJ:[id number] DOB: 06-01-1976 DOA: 06/26/2019  Referring MD/NP/PA:   PCP: Patient, No Pcp Per   Patient coming from:  The patient is coming from home.  At baseline, pt is independent for most of ADL.        Chief Complaint: Drug over dose  HPI: Henry Stanley is a 43 y.o. male with medical history significant of polysubstance abuse (cocaine, marijuana, benzo, tobacco), CKD-2, GERD, depression, who presents with drug overdose.  Pt states that he snorted 1 gram of cocaine and heroine yesterday evening, then he fell asleep for for a long time. His companions were reportedly worried because they were unable to wake him, but pt continues to have breathing.  After he woke up, he developed nausea and vomited few times. No AP or diarrhea. Patient developed chest pain. He denies shortness of breath to me.  The chest pain is located in substernal area, pressure like, 8 out of 10 severity, nonradiating. Patient states that he has cough with mixed blood for several times.  No fever or chills.  Denies symptoms of UTI or unilateral weakness. Pt was given 0.4 mg of Narcan in ED. When I saw pt in ED, he is mildly drowsy, but is oriented x 3.  Per ED physician, patient had oxygen desaturation to 84% on room air initially, currently 96% on room air.  ED Course: pt was found to have troponin 2121, WBC 13.4, alcohol level less than 10, pending COVID-19 test, potassium 6.4, worsening renal function, hypothermia with body temperature 97.4, blood pressure 22/93, tachycardia, oxygen saturation 93 to 96% on room air.  Chest X showed patchy airspace disease in the right upper lobe.  Patient is admitted to stepdown as inpatient and PUI.  Cardiology, Dr. Charna Busmanruby was consulted.  Review of Systems:   General: no fevers, chills, no body weight gain, has fatigue HEENT: no blurry vision, hearing changes or sore throat Respiratory: no dyspnea, wheezing, has coughing and hemoptysis CV: has  chest pain, no palpitations GI: has nausea, vomiting, no abdominal pain, diarrhea, constipation GU: no dysuria, burning on urination, increased urinary frequency, hematuria  Ext: no leg edema Neuro: no unilateral weakness, numbness, or tingling, no vision change or hearing loss Skin: no rash, no skin tear. MSK: No muscle spasm, no deformity, no limitation of range of movement in spin Heme: No easy bruising.  Travel history: No recent long distant travel.  Allergy:  Allergies  Allergen Reactions  . Sumatriptan Shortness Of Breath, Nausea And Vomiting, Palpitations and Other (See Comments)    Increased BP also  . Acetaminophen Other (See Comments)    reaction to migraine cocktail - rectal burning and agitation  . Benadryl [Diphenhydramine Hcl] Other (See Comments)     reaction to migraine cocktail - rectal burning and agitation    Past Medical History:  Diagnosis Date  . Anxiety   . Cocaine abuse (HCC)   . Depression   . GERD (gastroesophageal reflux disease)   . Polysubstance abuse (HCC)   . Seizures (HCC)   . Sinus infection     Past Surgical History:  Procedure Laterality Date  . FEMUR SURGERY      Social History:  reports that he has been smoking cigarettes. He has been smoking about 0.50 packs per day. He uses smokeless tobacco. He reports current alcohol use. He reports current drug use. Drugs: Cocaine, Marijuana, and Benzodiazepines.  Family History:  Family History  Problem Relation Age of Onset  .  Hypertension Mother      Prior to Admission medications   Medication Sig Start Date End Date Taking? Authorizing Provider  amoxicillin-clavulanate (AUGMENTIN) 875-125 MG tablet Take 1 tablet by mouth every 12 (twelve) hours. 11/12/18   Loura Halt A, NP  cetirizine (EQ ALLERGY RELIEF, CETIRIZINE,) 10 MG tablet Take 1 tablet (10 mg total) by mouth daily. 07/31/17   Caccavale, Sophia, PA-C  cetirizine-pseudoephedrine (ZYRTEC-D) 5-120 MG tablet Take 1 tablet by mouth  daily. 11/12/18   Bast, Tressia Miners A, NP  fluticasone (FLONASE) 50 MCG/ACT nasal spray Place 1 spray into both nostrils daily. 11/12/18   Loura Halt A, NP  ibuprofen (ADVIL,MOTRIN) 200 MG tablet Take 400 mg by mouth every 6 (six) hours as needed for headache or mild pain.    [provider]  methocarbamol (ROBAXIN) 500 MG tablet Take 1 tablet (500 mg total) by mouth 2 (two) times daily. 10/25/17   Palumbo, April, MD  naproxen (NAPROSYN) 375 MG tablet Take 1 tablet (375 mg total) by mouth 2 (two) times daily with a meal. 10/25/17   Palumbo, April, MD  oxymetazoline (AFRIN) 0.05 % nasal spray Place 1 spray into both nostrils 2 (two) times daily as needed for congestion.    [provider]    Physical Exam: Vitals:   06/27/19 0300 06/27/19 0315 06/27/19 0330 06/27/19 0400  BP: (!) 141/71 135/87 137/85 (!) 135/53  Pulse: 93 94 94 91  Resp: 14 (!) 6 10 12   Temp:      TempSrc:      SpO2: 96% 92% 92% 97%   General: Not in acute distress HEENT:       Eyes: PERRL, EOMI, no scleral icterus.       ENT: No discharge from the ears and nose, no pharynx injection, no tonsillar enlargement.        Neck: No JVD, no bruit, no mass felt. Heme: No neck lymph node enlargement. Cardiac: S1/S2, RRR, No murmurs, No gallops or rubs. Respiratory: No rales, wheezing, rhonchi or rubs. GI: Soft, nondistended, nontender, no rebound pain, no organomegaly, BS present. GU: No hematuria Ext: No pitting leg edema bilaterally. 2+DP/PT pulse bilaterally. Musculoskeletal: No joint deformities, No joint redness or warmth, no limitation of ROM in spin. Skin: No rashes.  Neuro: mildly drowsy, oriented X3, cranial nerves II-XII grossly intact, moves all extremities normally.  Psych: Patient is not psychotic, no suicidal or hemocidal ideation.  Labs on Admission: I have personally reviewed following labs and imaging studies  CBC: Recent Labs  Lab 06/26/19 2058 06/27/19 0446  WBC 13.4* 11.1*  HGB 15.8 15.2   HCT 48.6 45.5  MCV 102.5* 101.1*  PLT 236 659   Basic Metabolic Panel: Recent Labs  Lab 06/26/19 2058 06/27/19 0446  NA 141 136  K 6.4* 7.1*  CL 102 101  CO2 21* 25  GLUCOSE 92 122*  BUN 16 22*  CREATININE 2.80* 3.21*  CALCIUM 9.6 8.5*   GFR: CrCl cannot be calculated (Unknown ideal weight.). Liver Function Tests: Recent Labs  Lab 06/26/19 2058 06/27/19 0446  AST 99* 157*  ALT 68* 84*  ALKPHOS 74 66  BILITOT 0.5 0.7  PROT 8.4* 7.5  ALBUMIN 5.0 4.5   No results for input(s): LIPASE, AMYLASE in the last 168 hours. No results for input(s): AMMONIA in the last 168 hours. Coagulation Profile: Recent Labs  Lab 06/27/19 0446  INR 1.0   Cardiac Enzymes: No results for input(s): CKTOTAL, CKMB, CKMBINDEX, TROPONINI in the last 168 hours. BNP (  last 3 results) No results for input(s): PROBNP in the last 8760 hours. HbA1C: Recent Labs    06/27/19 0444  HGBA1C 5.1   CBG: Recent Labs  Lab 06/27/19 0200  GLUCAP 111*   Lipid Profile: Recent Labs    06/27/19 0445  CHOL 171  HDL 53  LDLCALC 103*  TRIG 75  CHOLHDL 3.2   Thyroid Function Tests: Recent Labs    06/27/19 0444 06/27/19 0445  TSH  --  0.642  FREET4 0.69  --    Anemia Panel: No results for input(s): VITAMINB12, FOLATE, FERRITIN, TIBC, IRON, RETICCTPCT in the last 72 hours. Urine analysis:    Component Value Date/Time   COLORURINE YELLOW 06/27/2019 0501   APPEARANCEUR CLOUDY (A) 06/27/2019 0501   LABSPEC 1.015 06/27/2019 0501   PHURINE 5.0 06/27/2019 0501   GLUCOSEU 50 (A) 06/27/2019 0501   HGBUR LARGE (A) 06/27/2019 0501   BILIRUBINUR NEGATIVE 06/27/2019 0501   KETONESUR NEGATIVE 06/27/2019 0501   PROTEINUR 100 (A) 06/27/2019 0501   UROBILINOGEN 0.2 01/18/2014 0450   NITRITE NEGATIVE 06/27/2019 0501   LEUKOCYTESUR NEGATIVE 06/27/2019 0501   Sepsis Labs: (procalcitonin:4,lacticidven:4) )No results found for this or any previous visit (from the past 240 hour(s)).    Radiological Exams on Admission: Dg Chest Portable 1 View  Result Date: 06/26/2019 CLINICAL DATA:  Shortness of breath post Narcan EXAM: PORTABLE CHEST 1 VIEW COMPARISON:  Radiograph 10/27/2016 FINDINGS: Patchy airspace consolidation and volume loss in the right upper lobe. Remaining aerated portions of the lungs are clear. No pneumothorax or effusion. Cardiomediastinal contours are unremarkable. No acute osseous or soft tissue abnormality. IMPRESSION: Patchy airspace consolidation and volume loss in the right upper lobe, could reflect a pneumonia or sequela of aspiration with atelectatic volume loss. Electronically Signed   By: Kreg Shropshire M.D.   On: 06/26/2019 23:12     EKG: Independently reviewed.  Sinus rhythm, tachycardia, QTC 435, LAE, anteroseptal infarction pattern.  Assessment/PlanPrincipal Problem:   Drug overdose Active Problems:   Acute respiratory failure (HCC)   Depression   Polysubstance abuse (HCC)   Hyperkalemia   Elevated troponin   Leukocytosis   Abnormal LFTs   Hypothermia   Acute renal failure superimposed on stage 2 chronic kidney disease (HCC)   Lobar pneumonia (HCC)  Principal Problem:   Drug overdose Active Problems:   Acute respiratory failure (HCC)   Depression   Polysubstance abuse (HCC)   Hyperkalemia   Elevated troponin   Abnormal LFTs   Hypothermia   Acute renal failure superimposed on stage 2 chronic kidney disease (HCC)   Lobar pneumonia (HCC)   Addendum: the AM BMP showed K 7.1, Cre up from 2.8 to 3.2. HR 91.  -will give D50, 8 unit of Novolog by IV, 2g of calcium chloride, 50 mEq of sodium bicarbonate, 45 g of Kayexalate, 4 puff of albuterol inhaler. -Stat EKG -give another 1L of NS  -recheck BMP at 8:00 AM.  -consult PCCM, Dr. Warrick Parisian -consulted renal, Dr. Kathrene Bongo   Drug overdose and polysubstance abuse: Including tobacco, cocaine, heroin, benzo. Patient's mental status improved, close to baseline now -admit to SDU as inpt  -consult SW -check UDS -Recommend neuro check -nicotine patch  Acute respiratory failure due to possible lobar pneumonia and sepsis: Patient has hemoptysis, which is likely due to PNA and drug abuse.  I have low suspicions for PE.  Patient meets criteria for sepsis with leukocytosis, tachycardia and tachypnea and hypothermia.  Lactic acid 4.6. Pending covid 19. - IV  Rocephin and azithromycin - Mucinex for cough  - Xopenex Neb prn for SOB - Urine legionella and S. pneumococcal antigen - Follow up blood culture x2, sputum culture and respiratory virus panel, plus Flu pcr - will get Procalcitonin and trend lactic acid level per sepsis protocol - IVF: 1L of Ringer's solution bolus in ED, followed by 100 mL per hour of NS (due to trop elevation 2121 -->3005 and elevated BNP 343, Dr. Charna Busman of card recommended to do not treat patient with more fluid bolus).  Depression: not taking meds now -observe  Hyperkalemia: K=6.4. No T wave peaking. -treated D50, 5 unit of Novolog by IV, 50 mEq of sodium bicarbonate and 10 g of Lokelma  Elevated troponin and possible NSTEMI: Troponin 2121 -->3005. Dr. Charna Busman of card was consulted.  She recommended IV heparin if no contraindication, but the patient has hemoptysis, I did not order IV heparin. -highly appreciate Dr. Ane Payment recommendations. - Repeat EKG in the am  - prn Nitroglycerin, Morphine, and aspirin, lipitor  - Risk factor stratification: will check FLP and A1C  - check UDS -Per Dr. Charna Busman, send BNP, TSH, Free T4, Utox, coags with AM labs.  -2d echo  Abnormal LFTs: -check hepatitis panel and HIV antibody -Avoid using Tylenol  Hypothermia: -Bair hugger  Acute renal failure superimposed on stage 2 chronic kidney disease (HCC): Baseline creatinine 1.22 on 10/25/2017.  His creatinine is 2.80, BUN 16. -IVF as above -hold home ibuprofen and naproxen -US-renal -renal, Dr. Kathrene Bongo was consulted.  DVT ppx: SCD Code Status: Full code Family  Communication: None at bed side.    Disposition Plan:  Anticipate discharge back to previous home environment Consults called:  none Admission status: Obs / tele   Date of Service 06/27/2019    Lorretta Harp [facility]   If 7PM-7AM, please contact night-coverage [email or web address] Password [facility] 06/27/2019, 6:49 AM

## 2019-06-27 NOTE — ED Notes (Signed)
Ultrasound at bedside

## 2019-06-27 NOTE — Progress Notes (Signed)
Medication history completed.  NOT added to his med list: alprazolam. He buys them on the street and takes one a day if needed for anxiety. He thinks they are 1mg .  Romeo Rabon, PharmD. Mobile: [phone]. 06/27/2019,8:19 AM.

## 2019-06-27 NOTE — ED Notes (Signed)
Pt c/o CP, 8/10; offered pt oxycodone (ordered q6h PRN for cp); pt refused

## 2019-06-27 NOTE — ED Notes (Signed)
Date and time results received: 06/27/19 0550 (use smartphrase ".now" to insert current time)  Test: potassium Critical Value: 7.1  Name of Provider Notified: Nui  Orders Received? Or Actions Taken?: Orders Received - See Orders for details

## 2019-06-28 DIAGNOSIS — F1410 Cocaine abuse, uncomplicated: Secondary | ICD-10-CM

## 2019-06-28 LAB — CBC WITH DIFFERENTIAL/PLATELET
Abs Immature Granulocytes: 0.02 10*3/uL (ref 0.00–0.07)
Basophils Absolute: 0 10*3/uL (ref 0.0–0.1)
Basophils Relative: 0 %
Eosinophils Absolute: 0 10*3/uL (ref 0.0–0.5)
Eosinophils Relative: 1 %
HCT: 33 % — ABNORMAL LOW (ref 39.0–52.0)
Hemoglobin: 11 g/dL — ABNORMAL LOW (ref 13.0–17.0)
Immature Granulocytes: 0 %
Lymphocytes Relative: 11 %
Lymphs Abs: 0.8 10*3/uL (ref 0.7–4.0)
MCH: 33 pg (ref 26.0–34.0)
MCHC: 33.3 g/dL (ref 30.0–36.0)
MCV: 99.1 fL (ref 80.0–100.0)
Monocytes Absolute: 0.7 10*3/uL (ref 0.1–1.0)
Monocytes Relative: 9 %
Neutro Abs: 6.1 10*3/uL (ref 1.7–7.7)
Neutrophils Relative %: 79 %
Platelets: 134 10*3/uL — ABNORMAL LOW (ref 150–400)
RBC: 3.33 MIL/uL — ABNORMAL LOW (ref 4.22–5.81)
RDW: 12.3 % (ref 11.5–15.5)
WBC: 7.6 10*3/uL (ref 4.0–10.5)
nRBC: 0 % (ref 0.0–0.2)

## 2019-06-28 LAB — COMPREHENSIVE METABOLIC PANEL
ALT: 74 U/L — ABNORMAL HIGH (ref 0–44)
AST: 124 U/L — ABNORMAL HIGH (ref 15–41)
Albumin: 3.2 g/dL — ABNORMAL LOW (ref 3.5–5.0)
Alkaline Phosphatase: 51 U/L (ref 38–126)
Anion gap: 10 (ref 5–15)
BUN: 18 mg/dL (ref 6–20)
CO2: 32 mmol/L (ref 22–32)
Calcium: 7.9 mg/dL — ABNORMAL LOW (ref 8.9–10.3)
Chloride: 96 mmol/L — ABNORMAL LOW (ref 98–111)
Creatinine, Ser: 2.3 mg/dL — ABNORMAL HIGH (ref 0.61–1.24)
GFR calc Af Amer: 39 mL/min — ABNORMAL LOW (ref >60)
GFR calc non Af Amer: 34 mL/min — ABNORMAL LOW (ref >60)
Glucose, Bld: 110 mg/dL — ABNORMAL HIGH (ref 70–99)
Potassium: 3 mmol/L — ABNORMAL LOW (ref 3.5–5.1)
Sodium: 138 mmol/L (ref 135–145)
Total Bilirubin: 0.5 mg/dL (ref 0.3–1.2)
Total Protein: 5.5 g/dL — ABNORMAL LOW (ref 6.5–8.1)

## 2019-06-28 LAB — HEPATITIS PANEL, ACUTE
HCV Ab: NONREACTIVE
Hep A IgM: NONREACTIVE
Hep B C IgM: NONREACTIVE
Hepatitis B Surface Ag: NONREACTIVE

## 2019-06-28 LAB — URINE CULTURE: Culture: NO GROWTH

## 2019-06-28 LAB — MAGNESIUM
Magnesium: 1.5 mg/dL — ABNORMAL LOW (ref 1.7–2.4)
Magnesium: 2.9 mg/dL — ABNORMAL HIGH (ref 1.7–2.4)

## 2019-06-28 LAB — PHOSPHORUS: Phosphorus: 2.5 mg/dL (ref 2.5–4.6)

## 2019-06-28 LAB — CK: Total CK: 8527 U/L — ABNORMAL HIGH (ref 49–397)

## 2019-06-28 LAB — LACTIC ACID, PLASMA: Lactic Acid, Venous: 1.1 mmol/L (ref 0.5–1.9)

## 2019-06-28 MED ORDER — POTASSIUM CHLORIDE CRYS ER 20 MEQ PO TBCR
40.0000 meq | EXTENDED_RELEASE_TABLET | Freq: Once | ORAL | Status: AC
  Administered 2019-06-28: 09:00:00 40 meq via ORAL
  Filled 2019-06-28: qty 2

## 2019-06-28 MED ORDER — SODIUM CHLORIDE 0.9 % IV SOLN
INTRAVENOUS | Status: DC
  Administered 2019-06-28 – 2019-06-29 (×4): via INTRAVENOUS

## 2019-06-28 MED ORDER — ENSURE ENLIVE PO LIQD
237.0000 mL | Freq: Two times a day (BID) | ORAL | Status: DC
  Administered 2019-06-28: 237 mL via ORAL

## 2019-06-28 MED ORDER — MAGNESIUM SULFATE 4 GM/100ML IV SOLN
4.0000 g | Freq: Once | INTRAVENOUS | Status: AC
  Administered 2019-06-28: 4 g via INTRAVENOUS
  Filled 2019-06-28: qty 100

## 2019-06-28 MED ORDER — MAGNESIUM SULFATE 4 GM/100ML IV SOLN: 4.0000 g | Freq: Once | INTRAVENOUS | Status: DC

## 2019-06-28 MED ORDER — HEPARIN SODIUM (PORCINE) 5000 UNIT/ML IJ SOLN
5000.0000 [IU] | Freq: Three times a day (TID) | INTRAMUSCULAR | Status: DC
  Administered 2019-06-28 – 2019-06-30 (×6): 5000 [IU] via SUBCUTANEOUS
  Filled 2019-06-28 (×6): qty 1

## 2019-06-28 NOTE — Progress Notes (Addendum)
PROGRESS NOTE    Henry Stanley   ZOX:[id number]  DOB: 04-23-1976  DOA: 06/26/2019 PCP: Patient, No Pcp Per   Brief Narrative:  Henry Stanley is a 43 y./o male with h/o cocaine abuse in the past, anxiety/ depression, who presented to the ED on 10/15 He presented after he snorted 1 gram of cocaine and heroin and drank a pint of liquor In ED > pulse ox 84% on room air, Somnolent.  Given Narcan. Vomited afterwards. Had tightness in left chest and coughed up blood.  Labs> K 6.4, Cr 2.8, Troponin 2121, CK 11,202, WBC 13.4, ETOH < 10 CXR> RUL infiltrate.  RR in 20s, HR > 100   Subjective:  Nauseated. Cannot tolerate solids or liquids.     Assessment & Plan:   Principal Problem:  Acute respiratory failure - lobar pneumonia- RUL-  leukocytosis, tachycardia and tachypnea with mild hypothermia>  sepsis - has RUL pneumonia- on 2 L 02- will try to wean to room air - on Zithromax and Ceftriaxone - blood cultures negative- sputum culture pending - resp panel and Influenza neg - no longer coughing up blood  Active Problems:    AKI (acute kidney injury) with secondary hyperkalemia - in setting of rhabdomyolysis and cocaine use - baseline Cr ~ 1.1- 1.2 - Cr as high as 3.21 and K 7.1 - both improving and actually mildly hypokalemia now - appreciate nephrology assisting with management - Isotonic Bicarb being changed to NS @ 100 cc/hr  Rhabdomyolysis - due to cocaine- improving but still in 8000s    Elevated troponin - Likely related to Rhabdo and cocaine use- not MI/ ACS per cardiology    Abnormal LFTs - may be related to rhabdo - Hepatitis panel neg  Mild thrombocytopenia - ? due to sepsis- follow  Hypomagnesemia - replaced    Time spent in minutes: 40 min with   DVT prophylaxis:  Lovenox-  Code Status: Full code Family Communication:  Disposition Plan: cont to treat in progressive unit Consultants:   nephro  Cardiology  ICU Procedures:   2 D ECHO 1. Left  ventricular ejection fraction, by visual estimation, is 60 to 65%. The left ventricle has normal function. There is mildly increased left ventricular hypertrophy.  2. Left ventricular diastolic Doppler parameters are consistent with pseudonormalization pattern of LV diastolic filling.  3. Global right ventricle has normal systolic function.The right ventricular size is normal. No increase in right ventricular wall thickness.  4. Left atrial size was normal.  5. Right atrial size was normal.  6. The mitral valve is normal in structure. Mild mitral valve regurgitation.  7. The tricuspid valve is normal in structure. Tricuspid valve regurgitation is trivial.  8. The aortic valve is normal in structure. Aortic valve regurgitation was not visualized by color flow Doppler. Structurally normal aortic valve, with no evidence of sclerosis or stenosis.  9. The pulmonic valve was normal in structure. Pulmonic valve regurgitation is not visualized by color flow Doppler. 10. The atrial septum is grossly normal. Antimicrobials:  Anti-infectives (From admission, onward)   Start     Dose/Rate Route Frequency Ordered Stop   06/27/19 0345  cefTRIAXone (ROCEPHIN) 1 g in sodium chloride 0.9 % 100 mL IVPB     1 g 200 mL/hr over 30 Minutes Intravenous Every 24 hours 06/27/19 0339     06/27/19 0345  azithromycin (ZITHROMAX) 500 mg in sodium chloride 0.9 % 250 mL IVPB     500 mg 250 mL/hr over 60 Minutes  Intravenous Every 24 hours 06/27/19 0339         Objective: Vitals:   06/28/19 0020 06/28/19 0427 06/28/19 0430 06/28/19 0900  BP: 128/77 121/77  122/70  Pulse: 95 98  86  Resp: 20 18    Temp: 98.4 F (36.9 C) (!) 97.2 F (36.2 C)  98.4 F (36.9 C)  TempSrc: Oral Oral  Oral  SpO2: 97% 98%  100%  Weight:   78.7 kg   Height:        Intake/Output Summary (Last 24 hours) at 06/28/2019 0948 Last data filed at 06/28/2019 0911 Gross per 24 hour  Intake 5751.74 ml  Output 960 ml  Net 4791.74 ml   Filed  Weights   06/27/19 1657 06/28/19 0430  Weight: 78.8 kg 78.7 kg    Examination: General exam: Appears comfortable  HEENT: PERRLA, oral mucosa moist, no sclera icterus or thrush Respiratory system: Clear to auscultation. Respiratory effort normal. Cardiovascular system: S1 & S2 heard, RRR.   Gastrointestinal system: Abdomen soft, non-tender, mildly distended. Normal bowel sounds. Central nervous system: Alert and oriented. No focal neurological deficits. Extremities: No cyanosis, clubbing or edema Skin: No rashes or ulcers Psychiatry:  Mood & affect appropriate.   Data Reviewed: I have personally reviewed following labs and imaging studies  CBC: Recent Labs  Lab 06/26/19 2058 06/27/19 0446 06/28/19 0453  WBC 13.4* 11.1* 7.6  NEUTROABS  --   --  6.1  HGB 15.8 15.2 11.0*  HCT 48.6 45.5 33.0*  MCV 102.5* 101.1* 99.1  PLT 236 204 134*   Basic Metabolic Panel: Recent Labs  Lab 06/26/19 2058 06/27/19 0446 06/27/19 0900 06/27/19 1550 06/28/19 0453  NA 141 136 137 139 138  K 6.4* 7.1* 5.4* 3.6 3.0*  CL 102 101 101 97* 96*  CO2 21* 25 21* 29 32  GLUCOSE 92 122* 100* 104* 110*  BUN 16 22* 23* 21* 18  CREATININE 2.80* 3.21* 3.11* 2.62* 2.30*  CALCIUM 9.6 8.5* 8.7* 8.3* 7.9*  MG  --   --   --   --  1.5*  PHOS  --   --  4.2  --  2.5   GFR: Estimated Creatinine Clearance: 40.1 mL/min (A) (by C-G formula based on SCr of 2.3 mg/dL (H)). Liver Function Tests: Recent Labs  Lab 06/26/19 2058 06/27/19 0446 06/27/19 0900 06/28/19 0453  AST 99* 157*  --  124*  ALT 68* 84*  --  74*  ALKPHOS 74 66  --  51  BILITOT 0.5 0.7  --  0.5  PROT 8.4* 7.5  --  5.5*  ALBUMIN 5.0 4.5 3.7 3.2*   No results for input(s): LIPASE, AMYLASE in the last 168 hours. No results for input(s): AMMONIA in the last 168 hours. Coagulation Profile: Recent Labs  Lab 06/27/19 0446  INR 1.0   Cardiac Enzymes: Recent Labs  Lab 06/27/19 0444 06/28/19 0453  CKTOTAL 11,202* 8,527*   BNP (last 3  results) No results for input(s): PROBNP in the last 8760 hours. HbA1C: Recent Labs    06/27/19 0444  HGBA1C 5.1   CBG: Recent Labs  Lab 06/27/19 0200 06/27/19 0859  GLUCAP 111* 97   Lipid Profile: Recent Labs    06/27/19 0445  CHOL 171  HDL 53  LDLCALC 103*  TRIG 75  CHOLHDL 3.2   Thyroid Function Tests: Recent Labs    06/27/19 0444 06/27/19 0445  TSH  --  0.642  FREET4 0.69  --    Anemia Panel: No  results for input(s): VITAMINB12, FOLATE, FERRITIN, TIBC, IRON, RETICCTPCT in the last 72 hours. Urine analysis:    Component Value Date/Time   COLORURINE YELLOW 06/27/2019 0501   APPEARANCEUR CLOUDY (A) 06/27/2019 0501   LABSPEC 1.015 06/27/2019 0501   PHURINE 5.0 06/27/2019 0501   GLUCOSEU 50 (A) 06/27/2019 0501   HGBUR LARGE (A) 06/27/2019 0501   BILIRUBINUR NEGATIVE 06/27/2019 0501   KETONESUR NEGATIVE 06/27/2019 0501   PROTEINUR 100 (A) 06/27/2019 0501   UROBILINOGEN 0.2 01/18/2014 0450   NITRITE NEGATIVE 06/27/2019 0501   LEUKOCYTESUR NEGATIVE 06/27/2019 0501   Sepsis Labs: @LABRCNTIP (procalcitonin:4,lacticidven:4) ) Recent Results (from the past 240 hour(s))  SARS CORONAVIRUS 2 (TAT 6-24 HRS) Nasopharyngeal Nasopharyngeal Swab     Status: None   Collection Time: 06/27/19  2:15 AM   Specimen: Nasopharyngeal Swab  Result Value Ref Range Status   SARS Coronavirus 2 NEGATIVE NEGATIVE Final    Comment: (NOTE) SARS-CoV-2 target nucleic acids are NOT DETECTED. The SARS-CoV-2 RNA is generally detectable in upper and lower respiratory specimens during the acute phase of infection. Negative results do not preclude SARS-CoV-2 infection, do not rule out co-infections with other pathogens, and should not be used as the sole basis for treatment or other patient management decisions. Negative results must be combined with clinical observations, patient history, and epidemiological information. The expected result is Negative. Fact Sheet for Patients:  SugarRoll.be Fact Sheet for Healthcare Providers: https://www.woods-mathews.com/ This test is not yet approved or cleared by the [place] FDA and  has been authorized for detection and/or diagnosis of SARS-CoV-2 by FDA under an Emergency Use Authorization (EUA). This EUA will remain  in effect (meaning this test can be used) for the duration of the COVID-19 declaration under Section 56 4(b)(1) of the Act, 21 U.S.C. section 360bbb-3(b)(1), unless the authorization is terminated or revoked sooner. Performed at [facility], [place] [address] [place], [place] [address]   Culture, blood (routine x 2) Call MD if unable to obtain prior to antibiotics being given     Status: None (Preliminary result)   Collection Time: 06/27/19  3:40 AM   Specimen: BLOOD  Result Value Ref Range Status   Specimen Description BLOOD RIGHT ANTECUBITAL  Final   Special Requests   Final    BOTTLES DRAWN AEROBIC AND ANAEROBIC Blood Culture adequate volume   Culture   Final    NO GROWTH 1 DAY Performed at [facility], [place] [address], [place], [place] [address]    Report Status PENDING  Incomplete  Culture, blood (routine x 2) Call MD if unable to obtain prior to antibiotics being given     Status: None (Preliminary result)   Collection Time: 06/27/19  4:20 AM   Specimen: BLOOD  Result Value Ref Range Status   Specimen Description BLOOD LEFT ANTECUBITAL  Final   Special Requests   Final    BOTTLES DRAWN AEROBIC AND ANAEROBIC Blood Culture adequate volume   Culture   Final    NO GROWTH 1 DAY Performed at [facility], [place] [address] [place], [place] [address]    Report Status PENDING  Incomplete  Culture, sputum-assessment     Status: None   Collection Time: 06/27/19  3:48 PM   Specimen: Expectorated Sputum  Result Value Ref Range Status   Specimen Description EXPECTORATED SPUTUM  Final   Special Requests NONE  Final   Sputum evaluation    Final    THIS SPECIMEN IS ACCEPTABLE FOR SPUTUM  CULTURE Performed at [facility], [address], [place], [place] [address]    Report Status 06/27/2019 FINAL  Final  Culture, respiratory     Status: None (Preliminary result)   Collection Time: 06/27/19  3:48 PM  Result Value Ref Range Status   Specimen Description EXPECTORATED SPUTUM  Final   Special Requests NONE Reflexed from [id number]  Final   Gram Stain   Final    ABUNDANT WBC PRESENT, PREDOMINANTLY PMN FEW SQUAMOUS EPITHELIAL CELLS PRESENT ABUNDANT GRAM POSITIVE COCCI ABUNDANT GRAM POSITIVE RODS MODERATE GRAM NEGATIVE RODS    Culture   Final    CULTURE REINCUBATED FOR BETTER GROWTH Performed at [facility], [address] [place], [place] [address]    Report Status PENDING  Incomplete  Respiratory Panel by PCR     Status: None   Collection Time: 06/27/19  5:25 PM   Specimen: Nasopharyngeal Swab; Respiratory  Result Value Ref Range Status   Adenovirus NOT DETECTED NOT DETECTED Final   Coronavirus 229E NOT DETECTED NOT DETECTED Final    Comment: (NOTE) The Coronavirus on the Respiratory Panel, DOES NOT test for the novel  Coronavirus (2019 nCoV)    Coronavirus HKU1 NOT DETECTED NOT DETECTED Final   Coronavirus NL63 NOT DETECTED NOT DETECTED Final   Coronavirus OC43 NOT DETECTED NOT DETECTED Final   Metapneumovirus NOT DETECTED NOT DETECTED Final   Rhinovirus / Enterovirus NOT DETECTED NOT DETECTED Final   Influenza A NOT DETECTED NOT DETECTED Final   Influenza B NOT DETECTED NOT DETECTED Final   Parainfluenza Virus 1 NOT DETECTED NOT DETECTED Final   Parainfluenza Virus 2 NOT DETECTED NOT DETECTED Final   Parainfluenza Virus 3 NOT DETECTED NOT DETECTED Final   Parainfluenza Virus 4 NOT DETECTED NOT DETECTED Final   Respiratory Syncytial Virus NOT DETECTED NOT DETECTED Final   Bordetella pertussis NOT DETECTED NOT DETECTED Final   Chlamydophila pneumoniae NOT DETECTED NOT DETECTED Final   Mycoplasma  pneumoniae NOT DETECTED NOT DETECTED Final    Comment: Performed at [facility], [address], [place], [place] [address]         Radiology Studies: US Renal  Result Date: 06/27/2019 CLINICAL DATA:  Acute kidney injury EXAM: RENAL / URINARY TRACT ULTRASOUND COMPLETE COMPARISON:  None. FINDINGS: Right Kidney: Renal measurements: 11.0 x 4.8 x 5.6 cm = volume: 154.9 mL . Echogenicity and renal cortical thickness are within normal limits. No mass, perinephric fluid, or hydronephrosis visualized. No sonographically demonstrable calculus or ureterectasis. Left Kidney: Renal measurements: 10.9 x 5.8 x 5.0 cm = volume: 165.1 mL. Echogenicity and renal cortical thickness are within normal limits. No perinephric fluid or hydronephrosis visualized. There is a cyst arising from the upper pole left kidney measuring 1.0 x 1.0 x 0.9 cm. No sonographically demonstrable calculus or ureterectasis. Bladder: Appears normal for degree of bladder distention. IMPRESSION: Small cyst upper pole left kidney.  Study otherwise unremarkable. Electronically Signed   By: Bretta Bang III M.D.   On: 06/27/2019 08:13   Dg Chest Portable 1 View  Result Date: 06/26/2019 CLINICAL DATA:  Shortness of breath post Narcan EXAM: PORTABLE CHEST 1 VIEW COMPARISON:  Radiograph 10/27/2016 FINDINGS: Patchy airspace consolidation and volume loss in the right upper lobe. Remaining aerated portions of the lungs are clear. No pneumothorax or effusion. Cardiomediastinal contours are unremarkable. No acute osseous or soft tissue abnormality. IMPRESSION: Patchy airspace consolidation and volume loss in the right upper lobe, could reflect a pneumonia or sequela  of aspiration with atelectatic volume loss. Electronically Signed   By: Kreg Shropshire M.D.   On: 06/26/2019 23:12      Scheduled Meds: . nicotine  21 mg Transdermal Daily   Continuous Infusions: . sodium chloride 100 mL/hr at 06/28/19 0144  . azithromycin 500 mg (06/28/19  0540)  . cefTRIAXone (ROCEPHIN)  IV 1 g (06/28/19 0458)  . magnesium sulfate bolus IVPB 4 g (06/28/19 0911)  .  sodium bicarbonate (isotonic) infusion in sterile water 125 mL/hr at 06/28/19 0146     LOS: 1 day      Calvert Cantor, MD [facility] Pager: [email or web address] Password [email or web address] 06/28/2019, 9:48 AM

## 2019-06-28 NOTE — Progress Notes (Signed)
Cardiology Progress Note  Patient ID: Henry Stanley MRN: [id number] DOB: [birth year] Date of Encounter: 06/28/2019  Primary Cardiologist: No primary care provider on file.  Subjective  Echocardiogram demonstrates normal LV systolic function.  EKG is without any ischemic changes.  He reports no chest pain this morning.  Labs still improving.  ROS:  All other ROS reviewed and negative. Pertinent positives noted in the HPI.     Inpatient Medications  Scheduled Meds: . nicotine  21 mg Transdermal Daily   Continuous Infusions: . sodium chloride 100 mL/hr at 06/28/19 0144  . azithromycin 500 mg (06/28/19 0540)  . cefTRIAXone (ROCEPHIN)  IV 1 g (06/28/19 0458)  . magnesium sulfate bolus IVPB 4 g (06/28/19 0911)  .  sodium bicarbonate (isotonic) infusion in sterile water 125 mL/hr at 06/28/19 0146   PRN Meds: acetaminophen, dextromethorphan-guaiFENesin, dicyclomine, hydrOXYzine, levalbuterol, loperamide, methocarbamol, nitroGLYCERIN, ondansetron **OR** ondansetron (ZOFRAN) IV, ondansetron   Vital Signs   Vitals:   06/28/19 0020 06/28/19 0427 06/28/19 0430 06/28/19 0900  BP: 128/77 121/77  122/70  Pulse: 95 98  86  Resp: 20 18    Temp: 98.4 F (36.9 C) (!) 97.2 F (36.2 C)  98.4 F (36.9 C)  TempSrc: Oral Oral  Oral  SpO2: 97% 98%  100%  Weight:   78.7 kg   Height:        Intake/Output Summary (Last 24 hours) at 06/28/2019 0935 Last data filed at 06/28/2019 0911 Gross per 24 hour  Intake 5751.74 ml  Output 960 ml  Net 4791.74 ml   Last 3 Weights 06/28/2019 06/27/2019 09/27/2018  Weight (lbs) 173 lb 8 oz 173 lb 11.6 oz 160 lb  Weight (kg) 78.699 kg 78.8 kg 72.576 kg  Some encounter information is confidential and restricted. Go to Review Flowsheets activity to see all data.      Telemetry  Overnight telemetry shows normal sinus rhythm with heart rate in the 70s, which I personally reviewed.   ECG  The most recent ECG shows sinus tachycardia with heart rate in the  100 range, no acute ischemic changes, no prior infarction, which I personally reviewed.   Physical Exam   Vitals:   06/28/19 0020 06/28/19 0427 06/28/19 0430 06/28/19 0900  BP: 128/77 121/77  122/70  Pulse: 95 98  86  Resp: 20 18    Temp: 98.4 F (36.9 C) (!) 97.2 F (36.2 C)  98.4 F (36.9 C)  TempSrc: Oral Oral  Oral  SpO2: 97% 98%  100%  Weight:   78.7 kg   Height:         Intake/Output Summary (Last 24 hours) at 06/28/2019 0935 Last data filed at 06/28/2019 0911 Gross per 24 hour  Intake 5751.74 ml  Output 960 ml  Net 4791.74 ml    Last 3 Weights 06/28/2019 06/27/2019 09/27/2018  Weight (lbs) 173 lb 8 oz 173 lb 11.6 oz 160 lb  Weight (kg) 78.699 kg 78.8 kg 72.576 kg  Some encounter information is confidential and restricted. Go to Review Flowsheets activity to see all data.    Body mass index is 28.87 kg/m.  General: Well nourished, well developed, in no acute distress Head: Atraumatic, normal size  Eyes: PEERLA, EOMI  Neck: Supple, no JVD Endocrine: No thryomegaly Cardiac: Normal S1, S2; RRR; no murmurs, rubs, or gallops Lungs: Clear to auscultation bilaterally, no wheezing, rhonchi or rales  Abd: Soft, nontender, no hepatomegaly  Ext: No edema, pulses 2+ Musculoskeletal: No deformities, BUE and BLE strength normal and  equal Skin: Warm and dry, no rashes   Neuro: Alert and oriented to person, place, time, and situation, CNII-XII grossly intact, no focal deficits  Psych: Normal mood and affect   Labs  High Sensitivity Troponin:   Recent Labs  Lab 06/26/19 2355 06/27/19 0132 06/27/19 0900 06/27/19 1550 06/27/19 2150  TROPONINIHS 2,121* 3,005* 2,194* 1,843* 1,370*     Cardiac EnzymesNo results for input(s): TROPONINI in the last 168 hours. No results for input(s): TROPIPOC in the last 168 hours.  Chemistry Recent Labs  Lab 06/26/19 2058 06/27/19 0446 06/27/19 0900 06/27/19 1550 06/28/19 0453  NA 141 136 137 139 138  K 6.4* 7.1* 5.4* 3.6 3.0*  CL  102 101 101 97* 96*  CO2 21* 25 21* 29 32  GLUCOSE 92 122* 100* 104* 110*  BUN 16 22* 23* 21* 18  CREATININE 2.80* 3.21* 3.11* 2.62* 2.30*  CALCIUM 9.6 8.5* 8.7* 8.3* 7.9*  PROT 8.4* 7.5  --   --  5.5*  ALBUMIN 5.0 4.5 3.7  --  3.2*  AST 99* 157*  --   --  124*  ALT 68* 84*  --   --  74*  ALKPHOS 74 66  --   --  51  BILITOT 0.5 0.7  --   --  0.5  GFRNONAA 26* 22* 23* 29* 34*  GFRAA 31* 26* 27* 33* 39*  ANIONGAP 18* 10 15 13 10     Hematology Recent Labs  Lab 06/26/19 2058 06/27/19 0446 06/28/19 0453  WBC 13.4* 11.1* 7.6  RBC 4.74 4.50 3.33*  HGB 15.8 15.2 11.0*  HCT 48.6 45.5 33.0*  MCV 102.5* 101.1* 99.1  MCH 33.3 33.8 33.0  MCHC 32.5 33.4 33.3  RDW 12.2 12.2 12.3  PLT 236 204 134*   BNP Recent Labs  Lab 06/27/19 0445  BNP 343.2*    DDimer No results for input(s): DDIMER in the last 168 hours.   Radiology  US Renal  Result Date: 06/27/2019 CLINICAL DATA:  Acute kidney injury EXAM: RENAL / URINARY TRACT ULTRASOUND COMPLETE COMPARISON:  None. FINDINGS: Right Kidney: Renal measurements: 11.0 x 4.8 x 5.6 cm = volume: 154.9 mL . Echogenicity and renal cortical thickness are within normal limits. No mass, perinephric fluid, or hydronephrosis visualized. No sonographically demonstrable calculus or ureterectasis. Left Kidney: Renal measurements: 10.9 x 5.8 x 5.0 cm = volume: 165.1 mL. Echogenicity and renal cortical thickness are within normal limits. No perinephric fluid or hydronephrosis visualized. There is a cyst arising from the upper pole left kidney measuring 1.0 x 1.0 x 0.9 cm. No sonographically demonstrable calculus or ureterectasis. Bladder: Appears normal for degree of bladder distention. IMPRESSION: Small cyst upper pole left kidney.  Study otherwise unremarkable. Electronically Signed   By: Lowella Grip III M.D.   On: 06/27/2019 08:13   Dg Chest Portable 1 View  Result Date: 06/26/2019 CLINICAL DATA:  Shortness of breath post Narcan EXAM: PORTABLE CHEST 1  VIEW COMPARISON:  Radiograph 10/27/2016 FINDINGS: Patchy airspace consolidation and volume loss in the right upper lobe. Remaining aerated portions of the lungs are clear. No pneumothorax or effusion. Cardiomediastinal contours are unremarkable. No acute osseous or soft tissue abnormality. IMPRESSION: Patchy airspace consolidation and volume loss in the right upper lobe, could reflect a pneumonia or sequela of aspiration with atelectatic volume loss. Electronically Signed   By: Lovena Le M.D.   On: 06/26/2019 23:12    Cardiac Studies  TTE 06/27/2019  1. Left ventricular ejection fraction, by visual estimation, is  60 to 65%. The left ventricle has normal function. There is mildly increased left ventricular hypertrophy.  2. Left ventricular diastolic Doppler parameters are consistent with pseudonormalization pattern of LV diastolic filling.  3. Global right ventricle has normal systolic function.The right ventricular size is normal. No increase in right ventricular wall thickness.  4. Left atrial size was normal.  5. Right atrial size was normal.  6. The mitral valve is normal in structure. Mild mitral valve regurgitation.  7. The tricuspid valve is normal in structure. Tricuspid valve regurgitation is trivial.  8. The aortic valve is normal in structure. Aortic valve regurgitation was not visualized by color flow Doppler. Structurally normal aortic valve, with no evidence of sclerosis or stenosis.  9. The pulmonic valve was normal in structure. Pulmonic valve regurgitation is not visualized by color flow Doppler. 10. The atrial septum is grossly normal.  Patient Profile  Henry Stanley is a 43 y.o. male with polysubstance abuse (alcohol, cocaine, heroin) who was admitted on 10/16 with acute liver injury, acute kidney injury, significant lactic acidosis (up to 4.9) attributed to ingestion of the after mentioned substances.  Cardiology was consulted due to elevated troponin values.  Assessment &  Plan  1.  Non-myocardial infarction troponin elevation in setting of severe sepsis and acute ingestion -He has no symptoms of chest pain, and EKG shows no acute ischemic changes.  This represents a non-MI troponin elevation in the setting of severe sepsis.  His echocardiogram also shows no wall motion abnormalities.  This is not an acute coronary syndrome, and would not treat this as such.  We will continue to hydrate to improve his kidney function as well as let his sepsis resolved.  He has been treated by his primary team.  Given how normal everything is looking from a cardiovascular standpoint, I really would not even recommend an ischemic evaluation prior to discharge.    [facility] will sign off.   Medication Recommendations: Continue supportive care of multiorgan system failure Other recommendations (labs, testing, etc): None Follow up as an outpatient: None  For questions or updates, please contact [facility] Please consult [email or web address] for contact info under        Signed, Gerri Spore T. Flora Lipps, MD [facility]  [facility]  06/28/2019 9:35 AM

## 2019-06-28 NOTE — Progress Notes (Signed)
Patient ID: Henry Stanley, male   DOB: 09/16/1975, 43 y.o.   MRN: [id number] S: Feeling a little better but complaining of left hip pain. O:BP 122/70 (BP Location: Right Arm)   Pulse 86   Temp 98.4 F (36.9 C) (Oral)   Resp 18   Ht 5\' 5"  (1.651 m)   Wt 78.7 kg   SpO2 100%   BMI 28.87 kg/m   Intake/Output Summary (Last 24 hours) at 06/28/2019 1040 Last data filed at 06/28/2019 0911 Gross per 24 hour  Intake 5751.74 ml  Output 960 ml  Net 4791.74 ml   Intake/Output: I/O last 3 completed shifts: In: 6100 [P.O.:820; I.V.:4831.7; IV Piggyback:448.2] Out: 660 [Urine:660]  Intake/Output this shift:  Total I/O In: -  Out: 300 [Urine:300] Weight change:  Gen: NAD CVS: no rub Resp: cta Abd: +BS, soft, NT/ND Ext: no edema  Recent Labs  Lab 06/26/19 2058 06/27/19 0446 06/27/19 0900 06/27/19 1550 06/28/19 0453  NA 141 136 137 139 138  K 6.4* 7.1* 5.4* 3.6 3.0*  CL 102 101 101 97* 96*  CO2 21* 25 21* 29 32  GLUCOSE 92 122* 100* 104* 110*  BUN 16 22* 23* 21* 18  CREATININE 2.80* 3.21* 3.11* 2.62* 2.30*  ALBUMIN 5.0 4.5 3.7  --  3.2*  CALCIUM 9.6 8.5* 8.7* 8.3* 7.9*  PHOS  --   --  4.2  --  2.5  AST 99* 157*  --   --  124*  ALT 68* 84*  --   --  74*   Liver Function Tests: Recent Labs  Lab 06/26/19 2058 06/27/19 0446 06/27/19 0900 06/28/19 0453  AST 99* 157*  --  124*  ALT 68* 84*  --  74*  ALKPHOS 74 66  --  51  BILITOT 0.5 0.7  --  0.5  PROT 8.4* 7.5  --  5.5*  ALBUMIN 5.0 4.5 3.7 3.2*   No results for input(s): LIPASE, AMYLASE in the last 168 hours. No results for input(s): AMMONIA in the last 168 hours. CBC: Recent Labs  Lab 06/26/19 2058 06/27/19 0446 06/28/19 0453  WBC 13.4* 11.1* 7.6  NEUTROABS  --   --  6.1  HGB 15.8 15.2 11.0*  HCT 48.6 45.5 33.0*  MCV 102.5* 101.1* 99.1  PLT 236 204 134*   Cardiac Enzymes: Recent Labs  Lab 06/27/19 0444 06/28/19 0453  CKTOTAL 11,202* 8,527*   CBG: Recent Labs  Lab 06/27/19 0200 06/27/19 0859   GLUCAP 111* 97    Iron Studies: No results for input(s): IRON, TIBC, TRANSFERRIN, FERRITIN in the last 72 hours. Studies/Results: 06/29/19 Renal  Result Date: 06/27/2019 CLINICAL DATA:  Acute kidney injury EXAM: RENAL / URINARY TRACT ULTRASOUND COMPLETE COMPARISON:  None. FINDINGS: Right Kidney: Renal measurements: 11.0 x 4.8 x 5.6 cm = volume: 154.9 mL . Echogenicity and renal cortical thickness are within normal limits. No mass, perinephric fluid, or hydronephrosis visualized. No sonographically demonstrable calculus or ureterectasis. Left Kidney: Renal measurements: 10.9 x 5.8 x 5.0 cm = volume: 165.1 mL. Echogenicity and renal cortical thickness are within normal limits. No perinephric fluid or hydronephrosis visualized. There is a cyst arising from the upper pole left kidney measuring 1.0 x 1.0 x 0.9 cm. No sonographically demonstrable calculus or ureterectasis. Bladder: Appears normal for degree of bladder distention. IMPRESSION: Small cyst upper pole left kidney.  Study otherwise unremarkable. Electronically Signed   By: 06/29/2019 III M.D.   On: 06/27/2019 08:13   Dg Chest Portable 1 View  Result Date: 06/26/2019 CLINICAL DATA:  Shortness of breath post Narcan EXAM: PORTABLE CHEST 1 VIEW COMPARISON:  Radiograph 10/27/2016 FINDINGS: Patchy airspace consolidation and volume loss in the right upper lobe. Remaining aerated portions of the lungs are clear. No pneumothorax or effusion. Cardiomediastinal contours are unremarkable. No acute osseous or soft tissue abnormality. IMPRESSION: Patchy airspace consolidation and volume loss in the right upper lobe, could reflect a pneumonia or sequela of aspiration with atelectatic volume loss. Electronically Signed   By: Lovena Le M.D.   On: 06/26/2019 23:12   . nicotine  21 mg Transdermal Daily    BMET    Component Value Date/Time   NA 138 06/28/2019 0453   K 3.0 (L) 06/28/2019 0453   CL 96 (L) 06/28/2019 0453   CO2 32 06/28/2019 0453    GLUCOSE 110 (H) 06/28/2019 0453   BUN 18 06/28/2019 0453   CREATININE 2.30 (H) 06/28/2019 0453   CALCIUM 7.9 (L) 06/28/2019 0453   GFRNONAA 34 (L) 06/28/2019 0453   GFRAA 39 (L) 06/28/2019 0453   CBC    Component Value Date/Time   WBC 7.6 06/28/2019 0453   RBC 3.33 (L) 06/28/2019 0453   HGB 11.0 (L) 06/28/2019 0453   HCT 33.0 (L) 06/28/2019 0453   PLT 134 (L) 06/28/2019 0453   MCV 99.1 06/28/2019 0453   MCH 33.0 06/28/2019 0453   MCHC 33.3 06/28/2019 0453   RDW 12.3 06/28/2019 0453   LYMPHSABS 0.8 06/28/2019 0453   MONOABS 0.7 06/28/2019 0453   EOSABS 0.0 06/28/2019 0453   BASOSABS 0.0 06/28/2019 0453     Assessment/Plan:  1. AKI- in setting of rhabdomyolysis following cocaine and heroin ingestion as well as NSAIDs.  Creatinine peaked at 3.11 but has improved with IV isotonic bicarb.    2. 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  CPK's peaked at 11,000 now down to 8,527.  CO2 32 so will change IVF's to NS and follow. 3. Hyperkalemia- resolved, now low after given bicarb.  Will follow after changing to NS 4. HTN- stable 5. Acute respiratory failure - presumably due to lobar pneumonia- covid negative on empiric rocephin and azithromycin per primary svc. 6. Abnormal LFT's- likely due to rhabdo and acute illness.  Donetta Potts, MD [employer] [phone]

## 2019-06-29 DIAGNOSIS — F1110 Opioid abuse, uncomplicated: Secondary | ICD-10-CM

## 2019-06-29 LAB — CBC
HCT: 33.2 % — ABNORMAL LOW (ref 39.0–52.0)
Hemoglobin: 11.2 g/dL — ABNORMAL LOW (ref 13.0–17.0)
MCH: 33.4 pg (ref 26.0–34.0)
MCHC: 33.7 g/dL (ref 30.0–36.0)
MCV: 99.1 fL (ref 80.0–100.0)
Platelets: 144 10*3/uL — ABNORMAL LOW (ref 150–400)
RBC: 3.35 MIL/uL — ABNORMAL LOW (ref 4.22–5.81)
RDW: 11.9 % (ref 11.5–15.5)
WBC: 7.3 10*3/uL (ref 4.0–10.5)
nRBC: 0 % (ref 0.0–0.2)

## 2019-06-29 LAB — COMPREHENSIVE METABOLIC PANEL
ALT: 68 U/L — ABNORMAL HIGH (ref 0–44)
AST: 86 U/L — ABNORMAL HIGH (ref 15–41)
Albumin: 3.2 g/dL — ABNORMAL LOW (ref 3.5–5.0)
Alkaline Phosphatase: 54 U/L (ref 38–126)
Anion gap: 8 (ref 5–15)
BUN: 17 mg/dL (ref 6–20)
CO2: 24 mmol/L (ref 22–32)
Calcium: 8.6 mg/dL — ABNORMAL LOW (ref 8.9–10.3)
Chloride: 107 mmol/L (ref 98–111)
Creatinine, Ser: 1.9 mg/dL — ABNORMAL HIGH (ref 0.61–1.24)
GFR calc Af Amer: 49 mL/min — ABNORMAL LOW (ref >60)
GFR calc non Af Amer: 42 mL/min — ABNORMAL LOW (ref >60)
Glucose, Bld: 98 mg/dL (ref 70–99)
Potassium: 3.9 mmol/L (ref 3.5–5.1)
Sodium: 139 mmol/L (ref 135–145)
Total Bilirubin: 0.6 mg/dL (ref 0.3–1.2)
Total Protein: 5.7 g/dL — ABNORMAL LOW (ref 6.5–8.1)

## 2019-06-29 LAB — LEGIONELLA PNEUMOPHILA SEROGP 1 UR AG: L. pneumophila Serogp 1 Ur Ag: NEGATIVE

## 2019-06-29 LAB — CK: Total CK: 4794 U/L — ABNORMAL HIGH (ref 49–397)

## 2019-06-29 LAB — MAGNESIUM: Magnesium: 2.3 mg/dL (ref 1.7–2.4)

## 2019-06-29 LAB — PHOSPHORUS: Phosphorus: 1.7 mg/dL — ABNORMAL LOW (ref 2.5–4.6)

## 2019-06-29 MED ORDER — POTASSIUM PHOSPHATE MONOBASIC 500 MG PO TABS
500.0000 mg | ORAL_TABLET | Freq: Two times a day (BID) | ORAL | Status: DC
  Filled 2019-06-29 (×3): qty 1

## 2019-06-29 MED ORDER — AZITHROMYCIN 500 MG PO TABS: 500.0000 mg | ORAL_TABLET | Freq: Every day | ORAL | Status: DC

## 2019-06-29 MED ORDER — AZITHROMYCIN 500 MG PO TABS
500.0000 mg | ORAL_TABLET | Freq: Every day | ORAL | Status: DC
  Administered 2019-06-29 – 2019-06-30 (×2): 500 mg via ORAL
  Filled 2019-06-29 (×2): qty 1

## 2019-06-29 MED ORDER — LORAZEPAM 2 MG/ML IJ SOLN
1.0000 mg | INTRAMUSCULAR | Status: DC | PRN
  Administered 2019-06-29 – 2019-06-30 (×3): 1 mg via INTRAVENOUS
  Filled 2019-06-29 (×3): qty 1

## 2019-06-29 MED ORDER — POTASSIUM PHOSPHATE MONOBASIC 500 MG PO TABS: 500.0000 mg | ORAL_TABLET | Freq: Two times a day (BID) | ORAL | Status: DC

## 2019-06-29 MED ORDER — K PHOS MONO-SOD PHOS DI & MONO 155-852-130 MG PO TABS
500.0000 mg | ORAL_TABLET | Freq: Two times a day (BID) | ORAL | Status: AC
  Administered 2019-06-29 – 2019-06-30 (×2): 500 mg via ORAL
  Filled 2019-06-29 (×2): qty 2

## 2019-06-29 MED ORDER — ALUM & MAG HYDROXIDE-SIMETH 200-200-20 MG/5ML PO SUSP
30.0000 mL | Freq: Four times a day (QID) | ORAL | Status: DC | PRN
  Administered 2019-06-29: 30 mL via ORAL
  Filled 2019-06-29: qty 30

## 2019-06-29 NOTE — Progress Notes (Signed)
PROGRESS NOTE    Henry Stanley   XTG:[id number]  DOB: 04/30/1976  DOA: 06/26/2019 PCP: Patient, No Pcp Per   Brief Narrative:  Henry Stanley is a 43 y./o male with h/o cocaine abuse in the past, anxiety/ depression, who presented to the ED on 10/15 He presented after he snorted 1 gram of cocaine and heroin and drank a pint of liquor In ED > pulse ox 84% on room air, Somnolent.  Given Narcan. Vomited afterwards. Had tightness in left chest and coughed up blood.  Labs> K 6.4, Cr 2.8, Troponin 2121, CK 11,202, WBC 13.4, ETOH < 10 CXR> RUL infiltrate.  RR in 20s, HR > 100   Subjective: Mild nausea today. No other complaints.     Assessment & Plan:   Principal Problem:  Acute respiratory failure - lobar pneumonia- RUL-  leukocytosis, tachycardia and tachypnea with mild hypothermia>  sepsis - has RUL pneumonia- on 2 L 02- will try to wean to room air - on Zithromax and Ceftriaxone-IV Azithromycin burning today- change to oral today- plan for 5 day course - blood cultures negative- sputum culture pending - resp panel and Influenza neg - COVID neg - no longer coughing up blood  Active Problems:    AKI (acute kidney injury) with secondary hyperkalemia - in setting of rhabdomyolysis and cocaine use - baseline Cr ~ 1.1- 1.2 - Cr as high as 3.21 and K 7.1 - both improving   - appreciate nephrology assisting with management - Isotonic Bicarb being changed to NS @ 100 cc/hr yesterday- awaiting renal eval today  Rhabdomyolysis - due to cocaine- improving and now ~ 4700    Elevated troponin - Likely related to Rhabdo and cocaine use- not MI/ ACS per cardiology    Abnormal LFTs - may be related to rhabdo - Hepatitis panel neg - LFTs are improving  Mild thrombocytopenia - ? due to sepsis- is better today  Hypomagnesemia - replaced   Hypophophatemia - will order K phos today   Time spent in minutes: 40 min with   DVT prophylaxis:  Lovenox-  Code Status: Full code  Family Communication:  Disposition Plan: transfer to med surg Consultants:   nephro  Cardiology  ICU Procedures:   2 D ECHO 1. Left ventricular ejection fraction, by visual estimation, is 60 to 65%. The left ventricle has normal function. There is mildly increased left ventricular hypertrophy.  2. Left ventricular diastolic Doppler parameters are consistent with pseudonormalization pattern of LV diastolic filling.  3. Global right ventricle has normal systolic function.The right ventricular size is normal. No increase in right ventricular wall thickness.  4. Left atrial size was normal.  5. Right atrial size was normal.  6. The mitral valve is normal in structure. Mild mitral valve regurgitation.  7. The tricuspid valve is normal in structure. Tricuspid valve regurgitation is trivial.  8. The aortic valve is normal in structure. Aortic valve regurgitation was not visualized by color flow Doppler. Structurally normal aortic valve, with no evidence of sclerosis or stenosis.  9. The pulmonic valve was normal in structure. Pulmonic valve regurgitation is not visualized by color flow Doppler. 10. The atrial septum is grossly normal. Antimicrobials:  Anti-infectives (From admission, onward)   Start     Dose/Rate Route Frequency Ordered Stop   06/29/19 1000  azithromycin (ZITHROMAX) tablet 500 mg  Status:  Discontinued     500 mg Oral Daily 06/29/19 0759 06/29/19 0800   06/29/19 0800  azithromycin (ZITHROMAX) tablet 500 mg  500 mg Oral Daily 06/29/19 0759     06/27/19 0345  cefTRIAXone (ROCEPHIN) 1 g in sodium chloride 0.9 % 100 mL IVPB     1 g 200 mL/hr over 30 Minutes Intravenous Every 24 hours 06/27/19 0339     06/27/19 0345  azithromycin (ZITHROMAX) 500 mg in sodium chloride 0.9 % 250 mL IVPB  Status:  Discontinued     500 mg 250 mL/hr over 60 Minutes Intravenous Every 24 hours 06/27/19 0339 06/29/19 0759       Objective: Vitals:   06/28/19 1206 06/28/19 1842 06/28/19 2056  06/29/19 0610  BP: 123/78 131/89 (!) 135/99 (!) 132/96  Pulse: 84 86 87 83  Resp: 16 16 14 14   Temp: 98.2 F (36.8 C)  98.5 F (36.9 C) 99 F (37.2 C)  TempSrc: Oral  Oral Oral  SpO2: 97% 93% 93% 95%  Weight:    79.8 kg  Height:        Intake/Output Summary (Last 24 hours) at 06/29/2019 0936 Last data filed at 06/29/2019 0900 Gross per 24 hour  Intake 3765.39 ml  Output 200 ml  Net 3565.39 ml   Filed Weights   06/27/19 1657 06/28/19 0430 06/29/19 0610  Weight: 78.8 kg 78.7 kg 79.8 kg    Examination: General exam: Appears comfortable  HEENT: PERRLA, oral mucosa moist, no sclera icterus or thrush Respiratory system: Clear to auscultation. Respiratory effort normal. Cardiovascular system: S1 & S2 heard,  No murmurs  Gastrointestinal system: Abdomen soft, non-tender, nondistended. Normal bowel sounds   Central nervous system: Alert and oriented. No focal neurological deficits. Extremities: No cyanosis, clubbing or edema Skin: No rashes or ulcers Psychiatry:  Mood & affect appropriate.   Data Reviewed: I have personally reviewed following labs and imaging studies  CBC: Recent Labs  Lab 06/26/19 2058 06/27/19 0446 06/28/19 0453 06/29/19 0534  WBC 13.4* 11.1* 7.6 7.3  NEUTROABS  --   --  6.1  --   HGB 15.8 15.2 11.0* 11.2*  HCT 48.6 45.5 33.0* 33.2*  MCV 102.5* 101.1* 99.1 99.1  PLT 236 204 134* 144*   Basic Metabolic Panel: Recent Labs  Lab 06/27/19 0446 06/27/19 0900 06/27/19 1550 06/28/19 0453 06/28/19 1101 06/29/19 0534  NA 136 137 139 138  --  139  K 7.1* 5.4* 3.6 3.0*  --  3.9  CL 101 101 97* 96*  --  107  CO2 25 21* 29 32  --  24  GLUCOSE 122* 100* 104* 110*  --  98  BUN 22* 23* 21* 18  --  17  CREATININE 3.21* 3.11* 2.62* 2.30*  --  1.90*  CALCIUM 8.5* 8.7* 8.3* 7.9*  --  8.6*  MG  --   --   --  1.5* 2.9* 2.3  PHOS  --  4.2  --  2.5  --  1.7*   GFR: Estimated Creatinine Clearance: 48.8 mL/min (A) (by C-G formula based on SCr of 1.9 mg/dL  (H)). Liver Function Tests: Recent Labs  Lab 06/26/19 2058 06/27/19 0446 06/27/19 0900 06/28/19 0453 06/29/19 0534  AST 99* 157*  --  124* 86*  ALT 68* 84*  --  74* 68*  ALKPHOS 74 66  --  51 54  BILITOT 0.5 0.7  --  0.5 0.6  PROT 8.4* 7.5  --  5.5* 5.7*  ALBUMIN 5.0 4.5 3.7 3.2* 3.2*   No results for input(s): LIPASE, AMYLASE in the last 168 hours. No results for input(s): AMMONIA in the last 168  hours. Coagulation Profile: Recent Labs  Lab 06/27/19 0446  INR 1.0   Cardiac Enzymes: Recent Labs  Lab 06/27/19 0444 06/28/19 0453 06/29/19 0534  CKTOTAL 11,202* 8,527* 4,794*   BNP (last 3 results) No results for input(s): PROBNP in the last 8760 hours. HbA1C: Recent Labs    06/27/19 0444  HGBA1C 5.1   CBG: Recent Labs  Lab 06/27/19 0200 06/27/19 0859  GLUCAP 111* 97   Lipid Profile: Recent Labs    06/27/19 0445  CHOL 171  HDL 53  LDLCALC 103*  TRIG 75  CHOLHDL 3.2   Thyroid Function Tests: Recent Labs    06/27/19 0444 06/27/19 0445  TSH  --  0.642  FREET4 0.69  --    Anemia Panel: No results for input(s): VITAMINB12, FOLATE, FERRITIN, TIBC, IRON, RETICCTPCT in the last 72 hours. Urine analysis:    Component Value Date/Time   COLORURINE YELLOW 06/27/2019 0501   APPEARANCEUR CLOUDY (A) 06/27/2019 0501   LABSPEC 1.015 06/27/2019 0501   PHURINE 5.0 06/27/2019 0501   GLUCOSEU 50 (A) 06/27/2019 0501   HGBUR LARGE (A) 06/27/2019 0501   BILIRUBINUR NEGATIVE 06/27/2019 0501   KETONESUR NEGATIVE 06/27/2019 0501   PROTEINUR 100 (A) 06/27/2019 0501   UROBILINOGEN 0.2 01/18/2014 0450   NITRITE NEGATIVE 06/27/2019 0501   LEUKOCYTESUR NEGATIVE 06/27/2019 0501   Sepsis Labs: (procalcitonin:4,lacticidven:4) ) Recent Results (from the past 240 hour(s))  SARS CORONAVIRUS 2 (TAT 6-24 HRS) Nasopharyngeal Nasopharyngeal Swab     Status: None   Collection Time: 06/27/19  2:15 AM   Specimen: Nasopharyngeal Swab  Result Value Ref Range Status    SARS Coronavirus 2 NEGATIVE NEGATIVE Final    Comment: (NOTE) SARS-CoV-2 target nucleic acids are NOT DETECTED. The SARS-CoV-2 RNA is generally detectable in upper and lower respiratory specimens during the acute phase of infection. Negative results do not preclude SARS-CoV-2 infection, do not rule out co-infections with other pathogens, and should not be used as the sole basis for treatment or other patient management decisions. Negative results must be combined with clinical observations, patient history, and epidemiological information. The expected result is Negative. Fact Sheet for Patients: HairSlick.no Fact Sheet for Healthcare Providers: quierodirigir.com This test is not yet approved or cleared by the [place] FDA and  has been authorized for detection and/or diagnosis of SARS-CoV-2 by FDA under an Emergency Use Authorization (EUA). This EUA will remain  in effect (meaning this test can be used) for the duration of the COVID-19 declaration under Section 56 4(b)(1) of the Act, 21 U.S.C. section 360bbb-3(b)(1), unless the authorization is terminated or revoked sooner. Performed at [facility], [address] [place], [place] [address]   Culture, blood (routine x 2) Call MD if unable to obtain prior to antibiotics being given     Status: None (Preliminary result)   Collection Time: 06/27/19  3:40 AM   Specimen: BLOOD  Result Value Ref Range Status   Specimen Description BLOOD RIGHT ANTECUBITAL  Final   Special Requests   Final    BOTTLES DRAWN AEROBIC AND ANAEROBIC Blood Culture adequate volume   Culture   Final    NO GROWTH 2 DAYS Performed at [facility], [address], [place], [place] [address]    Report Status PENDING  Incomplete  Culture, blood (routine x 2) Call MD if unable to obtain prior to antibiotics being given     Status: None (Preliminary result)   Collection Time: 06/27/19  4:20 AM  Specimen: BLOOD  Result Value Ref Range Status   Specimen Description BLOOD LEFT ANTECUBITAL  Final   Special Requests   Final    BOTTLES DRAWN AEROBIC AND ANAEROBIC Blood Culture adequate volume   Culture   Final    NO GROWTH 2 DAYS Performed at [facility], [address], [place], [place] [address]    Report Status PENDING  Incomplete  Urine Culture     Status: None   Collection Time: 06/27/19 11:15 AM   Specimen: Urine, Random  Result Value Ref Range Status   Specimen Description URINE, RANDOM  Final   Special Requests NONE  Final   Culture   Final    NO GROWTH Performed at [facility], [address] [place], [place] [address]    Report Status 06/28/2019 FINAL  Final  Culture, sputum-assessment     Status: None   Collection Time: 06/27/19  3:48 PM   Specimen: Expectorated Sputum  Result Value Ref Range Status   Specimen Description EXPECTORATED SPUTUM  Final   Special Requests NONE  Final   Sputum evaluation   Final    THIS SPECIMEN IS ACCEPTABLE FOR SPUTUM CULTURE Performed at [facility], [address], [place], [place] [address]    Report Status 06/27/2019 FINAL  Final  Culture, respiratory     Status: None (Preliminary result)   Collection Time: 06/27/19  3:48 PM  Result Value Ref Range Status   Specimen Description EXPECTORATED SPUTUM  Final   Special Requests NONE Reflexed from [id number]  Final   Gram Stain   Final    ABUNDANT WBC PRESENT, PREDOMINANTLY PMN FEW SQUAMOUS EPITHELIAL CELLS PRESENT ABUNDANT GRAM POSITIVE COCCI ABUNDANT GRAM POSITIVE RODS MODERATE GRAM NEGATIVE RODS    Culture   Final    CULTURE REINCUBATED FOR BETTER GROWTH Performed at [facility], [address], [place], [place] [address]    Report Status PENDING  Incomplete  Respiratory Panel by PCR     Status: None   Collection Time: 06/27/19  5:25 PM   Specimen: Nasopharyngeal Swab; Respiratory  Result Value Ref Range Status   Adenovirus NOT DETECTED NOT  DETECTED Final   Coronavirus 229E NOT DETECTED NOT DETECTED Final    Comment: (NOTE) The Coronavirus on the Respiratory Panel, DOES NOT test for the novel  Coronavirus (2019 nCoV)    Coronavirus HKU1 NOT DETECTED NOT DETECTED Final   Coronavirus NL63 NOT DETECTED NOT DETECTED Final   Coronavirus OC43 NOT DETECTED NOT DETECTED Final   Metapneumovirus NOT DETECTED NOT DETECTED Final   Rhinovirus / Enterovirus NOT DETECTED NOT DETECTED Final   Influenza A NOT DETECTED NOT DETECTED Final   Influenza B NOT DETECTED NOT DETECTED Final   Parainfluenza Virus 1 NOT DETECTED NOT DETECTED Final   Parainfluenza Virus 2 NOT DETECTED NOT DETECTED Final   Parainfluenza Virus 3 NOT DETECTED NOT DETECTED Final   Parainfluenza Virus 4 NOT DETECTED NOT DETECTED Final   Respiratory Syncytial Virus NOT DETECTED NOT DETECTED Final   Bordetella pertussis NOT DETECTED NOT DETECTED Final   Chlamydophila pneumoniae NOT DETECTED NOT DETECTED Final   Mycoplasma pneumoniae NOT DETECTED NOT DETECTED Final    Comment: Performed at [facility], [address], [place], [place] [address]         Radiology Studies: No results found.    Scheduled Meds: . azithromycin  500 mg Oral Daily  . feeding supplement (ENSURE ENLIVE)  237 mL  Oral BID BM  . heparin injection (subcutaneous)  5,000 Units Subcutaneous Q8H  . nicotine  21 mg Transdermal Daily   Continuous Infusions: . sodium chloride 100 mL/hr at 06/29/19 0004  . cefTRIAXone (ROCEPHIN)  IV 1 g (06/29/19 0540)     LOS: 2 days      Calvert Cantor, MD [facility] Pager: [email or web address] Password [facility] 06/29/2019, 9:36 AM

## 2019-06-29 NOTE — Progress Notes (Signed)
Page to MD  [phone] with dc of tele order can patient transfer? ED holding for tele beds

## 2019-06-29 NOTE — Progress Notes (Signed)
Patient ID: Henry Stanley, male   DOB: Aug 18, 1976, 43 y.o.   MRN: [id number] S: feels better but still with left hip pain O:BP (!) 132/96 (BP Location: Left Arm)   Pulse 83   Temp 99 F (37.2 C) (Oral)   Resp 14   Ht 5\' 5"  (1.651 m)   Wt 79.8 kg Comment: scale a   SpO2 95%   BMI 29.27 kg/m   Intake/Output Summary (Last 24 hours) at 06/29/2019 1020 Last data filed at 06/29/2019 0900 Gross per 24 hour  Intake 3103.21 ml  Output 200 ml  Net 2903.21 ml   Intake/Output: I/O last 3 completed shifts: In: 8477.3 [P.O.:1057; I.V.:7270.3; IV Piggyback:150] Out: 910 [Urine:910]  Intake/Output this shift:  Total I/O In: 1089.9 [P.O.:220; I.V.:168.1; IV Piggyback:701.8] Out: -  Weight change: 0.988 kg Gen: NAD CVS: no rub Resp: cta Abd: +BS, soft, NT/ND Ext: no edema  Recent Labs  Lab 06/26/19 2058 06/27/19 0446 06/27/19 0900 06/27/19 1550 06/28/19 0453 06/29/19 0534  NA 141 136 137 139 138 139  K 6.4* 7.1* 5.4* 3.6 3.0* 3.9  CL 102 101 101 97* 96* 107  CO2 21* 25 21* 29 32 24  GLUCOSE 92 122* 100* 104* 110* 98  BUN 16 22* 23* 21* 18 17  CREATININE 2.80* 3.21* 3.11* 2.62* 2.30* 1.90*  ALBUMIN 5.0 4.5 3.7  --  3.2* 3.2*  CALCIUM 9.6 8.5* 8.7* 8.3* 7.9* 8.6*  PHOS  --   --  4.2  --  2.5 1.7*  AST 99* 157*  --   --  124* 86*  ALT 68* 84*  --   --  74* 68*   Liver Function Tests: Recent Labs  Lab 06/27/19 0446 06/27/19 0900 06/28/19 0453 06/29/19 0534  AST 157*  --  124* 86*  ALT 84*  --  74* 68*  ALKPHOS 66  --  51 54  BILITOT 0.7  --  0.5 0.6  PROT 7.5  --  5.5* 5.7*  ALBUMIN 4.5 3.7 3.2* 3.2*   No results for input(s): LIPASE, AMYLASE in the last 168 hours. No results for input(s): AMMONIA in the last 168 hours. CBC: Recent Labs  Lab 06/26/19 2058 06/27/19 0446 06/28/19 0453 06/29/19 0534  WBC 13.4* 11.1* 7.6 7.3  NEUTROABS  --   --  6.1  --   HGB 15.8 15.2 11.0* 11.2*  HCT 48.6 45.5 33.0* 33.2*  MCV 102.5* 101.1* 99.1 99.1  PLT 236 204 134* 144*    Cardiac Enzymes: Recent Labs  Lab 06/27/19 0444 06/28/19 0453 06/29/19 0534  CKTOTAL 11,202* 8,527* 4,794*   CBG: Recent Labs  Lab 06/27/19 0200 06/27/19 0859  GLUCAP 111* 97    Iron Studies: No results for input(s): IRON, TIBC, TRANSFERRIN, FERRITIN in the last 72 hours. Studies/Results: No results found. Marland Kitchen azithromycin  500 mg Oral Daily  . feeding supplement (ENSURE ENLIVE)  237 mL Oral BID BM  . heparin injection (subcutaneous)  5,000 Units Subcutaneous Q8H  . nicotine  21 mg Transdermal Daily  . potassium phosphate (monobasic)  500 mg Oral BID WC    BMET    Component Value Date/Time   NA 139 06/29/2019 0534   K 3.9 06/29/2019 0534   CL 107 06/29/2019 0534   CO2 24 06/29/2019 0534   GLUCOSE 98 06/29/2019 0534   BUN 17 06/29/2019 0534   CREATININE 1.90 (H) 06/29/2019 0534   CALCIUM 8.6 (L) 06/29/2019 0534   GFRNONAA 42 (L) 06/29/2019 0534   GFRAA  49 (L) 06/29/2019 0534   CBC    Component Value Date/Time   WBC 7.3 06/29/2019 0534   RBC 3.35 (L) 06/29/2019 0534   HGB 11.2 (L) 06/29/2019 0534   HCT 33.2 (L) 06/29/2019 0534   PLT 144 (L) 06/29/2019 0534   MCV 99.1 06/29/2019 0534   MCH 33.4 06/29/2019 0534   MCHC 33.7 06/29/2019 0534   RDW 11.9 06/29/2019 0534   LYMPHSABS 0.8 06/28/2019 0453   MONOABS 0.7 06/28/2019 0453   EOSABS 0.0 06/28/2019 0453   BASOSABS 0.0 06/28/2019 0453     Assessment/Plan:  1. AKI- in setting of rhabdomyolysis following cocaine and heroin ingestion as well as NSAIDs.  Creatinine peaked at 3.11 but has improved with IV isotonic bicarb and now IV NS. 1. Cr down to 1.9 but CK levels still >4000.  Continue IVFs for the next 24 hours and re-evaluate in am.  2. 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  CPK's peaked at 11,000 now down to 8,527.  CO2 32 so IVF's were changed to NS. 3. Hyperkalemia- resolved, now low after given bicarb.  Will follow after changing to NS 4. HTN-  stable 5. Acute respiratory failure - presumably due to lobar pneumonia- covid negative on empiric rocephin and azithromycin per primary svc. 6. Abnormal LFT's- likely due to rhabdo and acute illness.  Irena Cords, MD [employer] [phone]

## 2019-06-29 NOTE — Progress Notes (Signed)
Pt c/o chest discomfort. Stated it felt like his chest was burning. Utilized standing orders for maalox. Also stated he "didn't feel right".  Complained of headache and feeling anxious. CIWA 7. Paged on call provider. Prn ativan added to [facility] and given to patient. Patient stated he received relief.

## 2019-06-29 NOTE — Progress Notes (Signed)
Patient c/o headache and difficulty catching his breath prior to receipt of report, pt however refuses to keep O2 on, then proceeds to deny symptoms upon questioning specifics in order to appropriately address.   Patient c/o discomfort at the IV site with antibiotics infusing, off-going RN stopped the infusion.   MD arrives on the floor, and was informed of these things prior to entering patient room.   MD changed abx to PO, stated he was "ok" upon her assessment.    Pt does still have a visitor at bedside, she has apparently been an overnight visitor x 2 nights now. Off-going RN states that she discovered this at midnight when she took over the assignment and upon mentioning/inquiring the patient became agitated stating if the visitor couldn't stay then he would be leaving. To avoid a highly discouraged AMA departure at midnight the night RN did not push the issue further. Visitor and patient were reminded this morning that the hospital policy is 94-8 for visitation at this time so the visitor would not be allowed to spend another night.   It's curious if this is what may have triggered the patient's episode this morning, as he claimed it could be anxiety- but again was not forthcoming with information upon assessing.

## 2019-06-29 NOTE — Progress Notes (Signed)
Call placed to [facility] to notify of telemetry monitoring d/c.   

## 2019-06-30 LAB — COMPREHENSIVE METABOLIC PANEL
ALT: 61 U/L — ABNORMAL HIGH (ref 0–44)
AST: 66 U/L — ABNORMAL HIGH (ref 15–41)
Albumin: 3 g/dL — ABNORMAL LOW (ref 3.5–5.0)
Alkaline Phosphatase: 51 U/L (ref 38–126)
Anion gap: 7 (ref 5–15)
BUN: 14 mg/dL (ref 6–20)
CO2: 21 mmol/L — ABNORMAL LOW (ref 22–32)
Calcium: 8.7 mg/dL — ABNORMAL LOW (ref 8.9–10.3)
Chloride: 110 mmol/L (ref 98–111)
Creatinine, Ser: 1.78 mg/dL — ABNORMAL HIGH (ref 0.61–1.24)
GFR calc Af Amer: 53 mL/min — ABNORMAL LOW (ref >60)
GFR calc non Af Amer: 46 mL/min — ABNORMAL LOW (ref >60)
Glucose, Bld: 91 mg/dL (ref 70–99)
Potassium: 3.6 mmol/L (ref 3.5–5.1)
Sodium: 138 mmol/L (ref 135–145)
Total Bilirubin: 1.1 mg/dL (ref 0.3–1.2)
Total Protein: 5.6 g/dL — ABNORMAL LOW (ref 6.5–8.1)

## 2019-06-30 LAB — CK: Total CK: 3043 U/L — ABNORMAL HIGH (ref 49–397)

## 2019-06-30 LAB — PHOSPHORUS: Phosphorus: 1.9 mg/dL — ABNORMAL LOW (ref 2.5–4.6)

## 2019-06-30 MED ORDER — AZITHROMYCIN 500 MG PO TABS: 500.0000 mg | ORAL_TABLET | Freq: Every day | ORAL | 0 refills | Status: DC

## 2019-06-30 MED ORDER — INFLUENZA VAC SPLIT QUAD 0.5 ML IM SUSY
0.5000 mL | PREFILLED_SYRINGE | INTRAMUSCULAR | Status: AC
  Administered 2019-06-30: 0.5 mL via INTRAMUSCULAR
  Filled 2019-06-30: qty 0.5

## 2019-06-30 MED ORDER — MAGNESIUM SULFATE 2 GM/50ML IV SOLN
2.0000 g | Freq: Once | INTRAVENOUS | Status: DC
  Filled 2019-06-30: qty 50

## 2019-06-30 MED ORDER — CEFUROXIME AXETIL 500 MG PO TABS: 500.0000 mg | ORAL_TABLET | Freq: Two times a day (BID) | ORAL | 0 refills | Status: DC

## 2019-06-30 NOTE — Plan of Care (Signed)
Pt has been dc, avs reviewed with pt and wife

## 2019-06-30 NOTE — TOC Progression Note (Addendum)
Transition of Care [facility]) - Progression Note    Patient Details  Name: Henry Stanley MRN: [id number] Date of Birth: Dec 17, 1975  Transition of Care [facility] CM/SW Contact  Zenon Mayo, RN Phone Number: 06/30/2019, 9:57 AM  Clinical Narrative:    Patient for dc today, he will need [facility] clinic apt, it has been set up.  He will need lab draws  Which has been scheduled for Wed at 11:00 for BMET, CPK and CBC.  NCM will ast patient with Match letter also ad discharge.  NCM notified MD to also put information about lab draws in DC summary.    Barriers to Discharge: No Barriers Identified  Expected Discharge Plan and Services           Expected Discharge Date: 06/30/19               DME Arranged: (NA)         HH Arranged: NA           Social Determinants of Health (SDOH) Interventions    Readmission Risk Interventions No flowsheet data found.

## 2019-06-30 NOTE — Progress Notes (Signed)
Patient ID: Henry Stanley, male   DOB: 03/23/1976, 43 y.o.   MRN: [id number] S: Reasonable UOP, crt trended down again - CK trended down as well but still 3000- really wants to go home    O:BP 140/82   Pulse 84   Temp 98.2 F (36.8 C) (Oral)   Resp 17   Ht 5\' 5"  (1.651 m)   Wt 80.4 kg   SpO2 97%   BMI 29.50 kg/m   Intake/Output Summary (Last 24 hours) at 06/30/2019 0836 Last data filed at 06/30/2019 0800 Gross per 24 hour  Intake 3231.53 ml  Output 1150 ml  Net 2081.53 ml   Intake/Output: I/O last 3 completed shifts: In: 3142.1 [P.O.:820; I.V.:1620.4; IV Piggyback:701.8] Out: 700 [Urine:700]  Intake/Output this shift:  Total I/O In: 1085.4 [I.V.:1085.4] Out: 450 [Urine:450] Weight change: 0.635 kg Gen: NAD CVS: no rub Resp: cta Abd: +BS, soft, NT/ND Ext: no edema  Recent Labs  Lab 06/26/19 2058 06/27/19 0446 06/27/19 0900 06/27/19 1550 06/28/19 0453 06/29/19 0534 06/30/19 0345  NA 141 136 137 139 138 139 138  K 6.4* 7.1* 5.4* 3.6 3.0* 3.9 3.6  CL 102 101 101 97* 96* 107 110  CO2 21* 25 21* 29 32 24 21*  GLUCOSE 92 122* 100* 104* 110* 98 91  BUN 16 22* 23* 21* 18 17 14   CREATININE 2.80* 3.21* 3.11* 2.62* 2.30* 1.90* 1.78*  ALBUMIN 5.0 4.5 3.7  --  3.2* 3.2* 3.0*  CALCIUM 9.6 8.5* 8.7* 8.3* 7.9* 8.6* 8.7*  PHOS  --   --  4.2  --  2.5 1.7* 1.9*  AST 99* 157*  --   --  124* 86* 66*  ALT 68* 84*  --   --  74* 68* 61*   Liver Function Tests: Recent Labs  Lab 06/28/19 0453 06/29/19 0534 06/30/19 0345  AST 124* 86* 66*  ALT 74* 68* 61*  ALKPHOS 51 54 51  BILITOT 0.5 0.6 1.1  PROT 5.5* 5.7* 5.6*  ALBUMIN 3.2* 3.2* 3.0*   No results for input(s): LIPASE, AMYLASE in the last 168 hours. No results for input(s): AMMONIA in the last 168 hours. CBC: Recent Labs  Lab 06/26/19 2058 06/27/19 0446 06/28/19 0453 06/29/19 0534  WBC 13.4* 11.1* 7.6 7.3  NEUTROABS  --   --  6.1  --   HGB 15.8 15.2 11.0* 11.2*  HCT 48.6 45.5 33.0* 33.2*  MCV 102.5* 101.1*  99.1 99.1  PLT 236 204 134* 144*   Cardiac Enzymes: Recent Labs  Lab 06/27/19 0444 06/28/19 0453 06/29/19 0534 06/30/19 0345  CKTOTAL 11,202* 8,527* 4,794* 3,043*   CBG: Recent Labs  Lab 06/27/19 0200 06/27/19 0859  GLUCAP 111* 97    Iron Studies: No results for input(s): IRON, TIBC, TRANSFERRIN, FERRITIN in the last 72 hours. Studies/Results: No results found. Marland Kitchen azithromycin  500 mg Oral Daily  . feeding supplement (ENSURE ENLIVE)  237 mL Oral BID BM  . heparin injection (subcutaneous)  5,000 Units Subcutaneous Q8H  . influenza vac split quadrivalent PF  0.5 mL Intramuscular Tomorrow-1000  . nicotine  21 mg Transdermal Daily  . phosphorus  500 mg Oral BID    BMET    Component Value Date/Time   NA 138 06/30/2019 0345   K 3.6 06/30/2019 0345   CL 110 06/30/2019 0345   CO2 21 (L) 06/30/2019 0345   GLUCOSE 91 06/30/2019 0345   BUN 14 06/30/2019 0345   CREATININE 1.78 (H) 06/30/2019 0345   CALCIUM 8.7 (  L) 06/30/2019 0345   GFRNONAA 46 (L) 06/30/2019 0345   GFRAA 53 (L) 06/30/2019 0345   CBC    Component Value Date/Time   WBC 7.3 06/29/2019 0534   RBC 3.35 (L) 06/29/2019 0534   HGB 11.2 (L) 06/29/2019 0534   HCT 33.2 (L) 06/29/2019 0534   PLT 144 (L) 06/29/2019 0534   MCV 99.1 06/29/2019 0534   MCH 33.4 06/29/2019 0534   MCHC 33.7 06/29/2019 0534   RDW 11.9 06/29/2019 0534   LYMPHSABS 0.8 06/28/2019 0453   MONOABS 0.7 06/28/2019 0453   EOSABS 0.0 06/28/2019 0453   BASOSABS 0.0 06/28/2019 0453     Assessment/Plan:  1. AKI- in setting of rhabdomyolysis following cocaine and heroin ingestion as well as NSAIDs.  Creatinine peaked at 3.11 but has improved with IV isotonic bicarb and now IV NS. 1. Cr down to 1.78 but CK levels still >3000.  Will go ahead and stop IVF today, tell him to drink ad lib PO- I would like to  ck and crt  again in AM with just him drinking lots of fluids.  He really wants to go home today- says he will do "whatever we say"  Will try  to discharge  to get labs on Wednesday this week, told him to drink 4 liters of fluid daily- trying to get at [facility] and wellness- if labs recover completely he will not need follow up with [place] as OP  2. 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  CPK's peaked at 11,000 now down to 3000.  needs to drink lots of fluids to assure continued recovery  3. Hyperkalemia- resolved, now low after given bicarb.   4. HTN- stable 5. Acute respiratory failure - presumably due to lobar pneumonia- covid negative on empiric rocephin and azithromycin per primary svc. Transitioning to PO abx 6. Abnormal LFT's- likely due to rhabdo and acute illness. Improving   Cecille Aver  [employer] [phone]

## 2019-06-30 NOTE — Discharge Instructions (Signed)
Please avoid all illegal drugs.  You were cared for by a hospitalist during your hospital stay. If you have any questions about your discharge medications or the care you received while you were in the hospital after you are discharged, you can call the unit and asked to speak with the hospitalist on call if the hospitalist that took care of you is not available. Once you are discharged, your primary care physician will handle any further medical issues.   Please note that NO REFILLS for any discharge medications will be authorized once you are discharged, as it is imperative that you return to your primary care physician (or establish a relationship with a primary care physician if you do not have one) for your aftercare needs so that they can reassess your need for medications and monitor your lab values.  Please take all your medications with you for your next visit with your Primary MD. Please ask your Primary MD to get all Hospital records sent to his/her office. Please request your Primary MD to go over all hospital test results at the follow up.   If you experience worsening of your admission symptoms, develop shortness of breath, chest pain, suicidal or homicidal thoughts or a life threatening emergency, you must seek medical attention immediately by calling 911 or calling your MD.   Dennis Bast must read the complete instructions/literature along with all the possible adverse reactions/side effects for all the medicines you take including new medications that have been prescribed to you. Take new medicines after you have completely understood and accpet all the possible adverse reactions/side effects.    Do not drive when taking pain medications or sedatives.     Do not take more than prescribed Pain, Sleep and Anxiety Medications   If you have smoked or chewed Tobacco in the last 2 yrs please stop. Stop any regular alcohol  and or recreational drug use.   Wear Seat belts while driving.

## 2019-06-30 NOTE — Discharge Summary (Addendum)
Physician Discharge Summary  Sherod Cisse Garnett EAV:[id number] DOB: 01/25/1976 DOA: 06/26/2019  PCP: Patient, No Pcp Per  Admit date: 06/26/2019 Discharge date: 06/30/2019  Admitted From: home Disposition:  home   Recommendations for Outpatient Follow-up:  1. He will need a Cmet, CBC and CPK at [facility]- this has been arranged  Home Health:  none  Equipment/Devices:  none    Discharge Condition:  stable   CODE STATUS:  Full code   Consultations:  nephrology   cardioloy   Discharge Diagnoses:  Principal Problem:   Drug overdose Active Problems:   Acute respiratory failure -  Lobar pneumonia  Acute renal failure superimposed on stage 2 chronic kidney disease (HCC)   AKI (acute kidney injury)   Polysubstance abuse (HCC)   Hyperkalemia   Elevated troponin   Abnormal LFTs   Hypothermia       Brief Summary: Henry Stanley is a 43 y./o male with h/o cocaine abuse in the past, anxiety/ depression, who presented to the ED on 10/15 He presented after he snorted 1 gram of cocaine and heroin and drank a pint of liquor In ED > pulse ox 84% on room air, Somnolent.  Given Narcan. Vomited afterwards. Had tightness in left chest and coughed up blood.  Labs> K 6.4, Cr 2.8, Troponin 2121, CK 11,202, WBC 13.4, ETOH < 10 CXR> RUL infiltrate.  RR in 20s, HR > 100  Hospital Course:  Principal Problem:  Acute respiratory failure - lobar pneumonia- RUL-  leukocytosis, tachycardia and tachypnea with mild hypothermia>  sepsis - acute respiratory failure due to drug OD and pneumonia - no longer coughing up blood - has RUL pneumonia- on 2 L 02- has been weaned to room air - on Zithromax and Ceftriaxone--plan for 5 day course- last dose tomorrow- case manager assisting with obtaining meds - blood cultures negative- sputum culture is still pending  - resp panel and Influenza neg - COVID neg   Active Problems:    AKI (acute kidney injury) with secondary hyperkalemia - in setting of  rhabdomyolysis and cocaine use - baseline Cr ~ 1.1- 1.2 - Cr as high as 3.21 and K 7.1 - both improving   - appreciate nephrology assisting with management - treated with Isotonic Bicarb and then NS-  Cr now 1.78, CO2 21 - patient is adamant about being discharged today- Nephrology recommends he drink 4 L of water daily and have renal function and CK checked in 2 days- I have consulted the case manager and we have arranged blood work and follow up  Mild LVH and diastolic CHF - not fluid overloaded- see ECHO report below- outpt follow up  Rhabdomyolysis - due to cocaine- improving and now ~ 3043    Elevated troponin - Likely related to Rhabdo and cocaine use- not MI/ ACS per cardiology who has signed off    Abnormal LFTs - may be related to rhabdo - Hepatitis panel neg - LFTs are improving- f/u on 2 days  Mild thrombocytopenia - ? due to sepsis- also improving- f/u CBC in 2 days  Hypomagnesemia - replaced   Hypophophatemia - will receive more K phos today  Anemia  - outpt follow up  Polysubstance abuse - he has been counseled - social work consult requested  - he states he is afraid to use drugs again- he states he has a lot of support through his mosque    Discharge Exam: Vitals:   06/30/19 0519 06/30/19 0800  BP: 140/82   Pulse:  71 84  Resp:    Temp: 98.2 F (36.8 C)   SpO2: 97%    Vitals:   06/29/19 1933 06/30/19 0219 06/30/19 0519 06/30/19 0800  BP: (!) 161/91 (!) 145/92 140/82   Pulse: 76 84 71 84  Resp: 17 17    Temp: 99.4 F (37.4 C) 98.3 F (36.8 C) 98.2 F (36.8 C)   TempSrc: Oral Oral Oral   SpO2: 96% 95% 97%   Weight:   80.4 kg   Height:        General: Pt is alert, awake, not in acute distress Cardiovascular: RRR, S1/S2 +, no rubs, no gallops Respiratory: CTA bilaterally, no wheezing, no rhonchi Abdominal: Soft, NT, ND, bowel sounds + Extremities: no edema, no cyanosis   Discharge Instructions  Discharge Instructions    Diet  general   Complete by: As directed    Regular diet with additional fluids as recommended during today's discussion   Increase activity slowly   Complete by: As directed      Allergies as of 06/30/2019      Reactions   Sumatriptan Shortness Of Breath, Nausea And Vomiting, Palpitations, Other (See Comments)   Increased BP also   Acetaminophen Other (See Comments)   reaction to migraine cocktail - rectal burning and agitation   Benadryl [diphenhydramine Hcl] Other (See Comments)    reaction to migraine cocktail - rectal burning and agitation      Medication List    STOP taking these medications   acetaminophen 500 MG tablet Commonly known as: TYLENOL     TAKE these medications   azithromycin 500 MG tablet Commonly known as: ZITHROMAX Take 1 tablet (500 mg total) by mouth daily. Start taking on: June 30, 9832   bismuth subsalicylate 825 KN/39JQ suspension Commonly known as: PEPTO BISMOL Take 30 mLs by mouth every 6 (six) hours as needed for indigestion or diarrhea or loose stools.   cefUROXime 500 MG tablet Commonly known as: CEFTIN Take 1 tablet (500 mg total) by mouth 2 (two) times daily for 1 day.      Follow-up Information    [facility] on 07/22/2019.   Why: @10 :50am Contact information: [place] [id number] [phone]         Allergies  Allergen Reactions  . Sumatriptan Shortness Of Breath, Nausea And Vomiting, Palpitations and Other (See Comments)    Increased BP also  . Acetaminophen Other (See Comments)    reaction to migraine cocktail - rectal burning and agitation  . Benadryl [Diphenhydramine Hcl] Other (See Comments)     reaction to migraine cocktail - rectal burning and agitation     Procedures/Studies:  2 D ECHO 1. Left ventricular ejection fraction, by visual estimation, is 60 to 65%. The left ventricle has normal function. There is mildly increased left ventricular  hypertrophy. 2. Left ventricular diastolic Doppler parameters are consistent with pseudonormalization pattern of LV diastolic filling. 3. Global right ventricle has normal systolic function.The right ventricular size is normal. No increase in right ventricular wall thickness. 4. Left atrial size was normal. 5. Right atrial size was normal. 6. The mitral valve is normal in structure. Mild mitral valve regurgitation. 7. The tricuspid valve is normal in structure. Tricuspid valve regurgitation is trivial. 8. The aortic valve is normal in structure. Aortic valve regurgitation was not visualized by color flow Doppler. Structurally normal aortic valve, with no evidence of sclerosis or stenosis. 9. The pulmonic valve was normal  in structure. Pulmonic valve regurgitation is not visualized by color flow Doppler. 10. The atrial septum is grossly normal.  [place] Renal  Result Date: 06/27/2019 CLINICAL DATA:  Acute kidney injury EXAM: RENAL / URINARY TRACT ULTRASOUND COMPLETE COMPARISON:  None. FINDINGS: Right Kidney: Renal measurements: 11.0 x 4.8 x 5.6 cm = volume: 154.9 mL . Echogenicity and renal cortical thickness are within normal limits. No mass, perinephric fluid, or hydronephrosis visualized. No sonographically demonstrable calculus or ureterectasis. Left Kidney: Renal measurements: 10.9 x 5.8 x 5.0 cm = volume: 165.1 mL. Echogenicity and renal cortical thickness are within normal limits. No perinephric fluid or hydronephrosis visualized. There is a cyst arising from the upper pole left kidney measuring 1.0 x 1.0 x 0.9 cm. No sonographically demonstrable calculus or ureterectasis. Bladder: Appears normal for degree of bladder distention. IMPRESSION: Small cyst upper pole left kidney.  Study otherwise unremarkable. Electronically Signed   By: Bretta BangWilliam  Woodruff III M.D.   On: 06/27/2019 08:13   Dg Chest Portable 1 View  Result Date: 06/26/2019 CLINICAL DATA:  Shortness of breath post Narcan EXAM:  PORTABLE CHEST 1 VIEW COMPARISON:  Radiograph 10/27/2016 FINDINGS: Patchy airspace consolidation and volume loss in the right upper lobe. Remaining aerated portions of the lungs are clear. No pneumothorax or effusion. Cardiomediastinal contours are unremarkable. No acute osseous or soft tissue abnormality. IMPRESSION: Patchy airspace consolidation and volume loss in the right upper lobe, could reflect a pneumonia or sequela of aspiration with atelectatic volume loss. Electronically Signed   By: Kreg ShropshirePrice  DeHay M.D.   On: 06/26/2019 23:12      The results of significant diagnostics from this hospitalization (including imaging, microbiology, ancillary and laboratory) are listed below for reference.     Microbiology: Recent Results (from the past 240 hour(s))  SARS CORONAVIRUS 2 (TAT 6-24 HRS) Nasopharyngeal Nasopharyngeal Swab     Status: None   Collection Time: 06/27/19  2:15 AM   Specimen: Nasopharyngeal Swab  Result Value Ref Range Status   SARS Coronavirus 2 NEGATIVE NEGATIVE Final    Comment: (NOTE) SARS-CoV-2 target nucleic acids are NOT DETECTED. The SARS-CoV-2 RNA is generally detectable in upper and lower respiratory specimens during the acute phase of infection. Negative results do not preclude SARS-CoV-2 infection, do not rule out co-infections with other pathogens, and should not be used as the sole basis for treatment or other patient management decisions. Negative results must be combined with clinical observations, patient history, and epidemiological information. The expected result is Negative. Fact Sheet for Patients: HairSlick.nohttps://www.fda.gov/media/138098/download Fact Sheet for Healthcare Providers: quierodirigir.comhttps://www.fda.gov/media/138095/download This test is not yet approved or cleared by the [place] FDA and  has been authorized for detection and/or diagnosis of SARS-CoV-2 by FDA under an Emergency Use Authorization (EUA). This EUA will remain  in effect (meaning this test  can be used) for the duration of the COVID-19 declaration under Section 56 4(b)(1) of the Act, 21 U.S.C. section 360bbb-3(b)(1), unless the authorization is terminated or revoked sooner. Performed at [facility], [address] [place], [place] [id number]   Culture, blood (routine x 2) Call MD if unable to obtain prior to antibiotics being given     Status: None (Preliminary result)   Collection Time: 06/27/19  3:40 AM   Specimen: BLOOD  Result Value Ref Range Status   Specimen Description BLOOD RIGHT ANTECUBITAL  Final   Special Requests   Final    BOTTLES DRAWN AEROBIC AND ANAEROBIC Blood Culture adequate volume   Culture  Final    NO GROWTH 3 DAYS Performed at [facility], [address] [place], [place] [address]    Report Status PENDING  Incomplete  Culture, blood (routine x 2) Call MD if unable to obtain prior to antibiotics being given     Status: None (Preliminary result)   Collection Time: 06/27/19  4:20 AM   Specimen: BLOOD  Result Value Ref Range Status   Specimen Description BLOOD LEFT ANTECUBITAL  Final   Special Requests   Final    BOTTLES DRAWN AEROBIC AND ANAEROBIC Blood Culture adequate volume   Culture   Final    NO GROWTH 3 DAYS Performed at [facility], [address], [place], [place] [address]    Report Status PENDING  Incomplete  Urine Culture     Status: None   Collection Time: 06/27/19 11:15 AM   Specimen: Urine, Random  Result Value Ref Range Status   Specimen Description URINE, RANDOM  Final   Special Requests NONE  Final   Culture   Final    NO GROWTH Performed at [facility], [address], [place], [place] [address]    Report Status 06/28/2019 FINAL  Final  Culture, sputum-assessment     Status: None   Collection Time: 06/27/19  3:48 PM   Specimen: Expectorated Sputum  Result Value Ref Range Status   Specimen Description EXPECTORATED SPUTUM  Final   Special Requests NONE  Final   Sputum evaluation   Final     THIS SPECIMEN IS ACCEPTABLE FOR SPUTUM CULTURE Performed at [facility], [address], [place], [place] [address]    Report Status 06/27/2019 FINAL  Final  Culture, respiratory     Status: None (Preliminary result)   Collection Time: 06/27/19  3:48 PM  Result Value Ref Range Status   Specimen Description EXPECTORATED SPUTUM  Final   Special Requests NONE Reflexed from [id number]  Final   Gram Stain   Final    ABUNDANT WBC PRESENT, PREDOMINANTLY PMN FEW SQUAMOUS EPITHELIAL CELLS PRESENT ABUNDANT GRAM POSITIVE COCCI ABUNDANT GRAM POSITIVE RODS MODERATE GRAM NEGATIVE RODS    Culture   Final    CULTURE REINCUBATED FOR BETTER GROWTH Performed at [facility], [address], [place], [place] [address]    Report Status PENDING  Incomplete  Respiratory Panel by PCR     Status: None   Collection Time: 06/27/19  5:25 PM   Specimen: Nasopharyngeal Swab; Respiratory  Result Value Ref Range Status   Adenovirus NOT DETECTED NOT DETECTED Final   Coronavirus 229E NOT DETECTED NOT DETECTED Final    Comment: (NOTE) The Coronavirus on the Respiratory Panel, DOES NOT test for the novel  Coronavirus (2019 nCoV)    Coronavirus HKU1 NOT DETECTED NOT DETECTED Final   Coronavirus NL63 NOT DETECTED NOT DETECTED Final   Coronavirus OC43 NOT DETECTED NOT DETECTED Final   Metapneumovirus NOT DETECTED NOT DETECTED Final   Rhinovirus / Enterovirus NOT DETECTED NOT DETECTED Final   Influenza A NOT DETECTED NOT DETECTED Final   Influenza B NOT DETECTED NOT DETECTED Final   Parainfluenza Virus 1 NOT DETECTED NOT DETECTED Final   Parainfluenza Virus 2 NOT DETECTED NOT DETECTED Final   Parainfluenza Virus 3 NOT DETECTED NOT DETECTED Final   Parainfluenza Virus 4 NOT DETECTED NOT DETECTED Final   Respiratory Syncytial Virus NOT DETECTED NOT DETECTED Final   Bordetella pertussis NOT DETECTED NOT DETECTED Final   Chlamydophila pneumoniae NOT  DETECTED NOT DETECTED Final   Mycoplasma pneumoniae NOT  DETECTED NOT DETECTED Final    Comment: Performed at [facility], [address] [place], [place] [address]     Labs: BNP (last 3 results) Recent Labs    06/27/19 0445  BNP 343.2*   Basic Metabolic Panel: Recent Labs  Lab 06/27/19 0900 06/27/19 1550 06/28/19 0453 06/28/19 1101 06/29/19 0534 06/30/19 0345  NA 137 139 138  --  139 138  K 5.4* 3.6 3.0*  --  3.9 3.6  CL 101 97* 96*  --  107 110  CO2 21* 29 32  --  24 21*  GLUCOSE 100* 104* 110*  --  98 91  BUN 23* 21* 18  --  17 14  CREATININE 3.11* 2.62* 2.30*  --  1.90* 1.78*  CALCIUM 8.7* 8.3* 7.9*  --  8.6* 8.7*  MG  --   --  1.5* 2.9* 2.3  --   PHOS 4.2  --  2.5  --  1.7* 1.9*   Liver Function Tests: Recent Labs  Lab 06/26/19 2058 06/27/19 0446 06/27/19 0900 06/28/19 0453 06/29/19 0534 06/30/19 0345  AST 99* 157*  --  124* 86* 66*  ALT 68* 84*  --  74* 68* 61*  ALKPHOS 74 66  --  51 54 51  BILITOT 0.5 0.7  --  0.5 0.6 1.1  PROT 8.4* 7.5  --  5.5* 5.7* 5.6*  ALBUMIN 5.0 4.5 3.7 3.2* 3.2* 3.0*   No results for input(s): LIPASE, AMYLASE in the last 168 hours. No results for input(s): AMMONIA in the last 168 hours. CBC: Recent Labs  Lab 06/26/19 2058 06/27/19 0446 06/28/19 0453 06/29/19 0534  WBC 13.4* 11.1* 7.6 7.3  NEUTROABS  --   --  6.1  --   HGB 15.8 15.2 11.0* 11.2*  HCT 48.6 45.5 33.0* 33.2*  MCV 102.5* 101.1* 99.1 99.1  PLT 236 204 134* 144*   Cardiac Enzymes: Recent Labs  Lab 06/27/19 0444 06/28/19 0453 06/29/19 0534 06/30/19 0345  CKTOTAL 11,202* 8,527* 4,794* 3,043*   BNP: Invalid input(s): POCBNP CBG: Recent Labs  Lab 06/27/19 0200 06/27/19 0859  GLUCAP 111* 97   D-Dimer No results for input(s): DDIMER in the last 72 hours. Hgb A1c No results for input(s): HGBA1C in the last 72 hours. Lipid Profile No results for input(s): CHOL, HDL, LDLCALC, TRIG, CHOLHDL, LDLDIRECT in the last 72 hours. Thyroid function studies No results for input(s): TSH, T4TOTAL, T3FREE,  THYROIDAB in the last 72 hours.  Invalid input(s): FREET3 Anemia work up No results for input(s): VITAMINB12, FOLATE, FERRITIN, TIBC, IRON, RETICCTPCT in the last 72 hours. Urinalysis    Component Value Date/Time   COLORURINE YELLOW 06/27/2019 0501   APPEARANCEUR CLOUDY (A) 06/27/2019 0501   LABSPEC 1.015 06/27/2019 0501   PHURINE 5.0 06/27/2019 0501   GLUCOSEU 50 (A) 06/27/2019 0501   HGBUR LARGE (A) 06/27/2019 0501   BILIRUBINUR NEGATIVE 06/27/2019 0501   KETONESUR NEGATIVE 06/27/2019 0501   PROTEINUR 100 (A) 06/27/2019 0501   UROBILINOGEN 0.2 01/18/2014 0450   NITRITE NEGATIVE 06/27/2019 0501   LEUKOCYTESUR NEGATIVE 06/27/2019 0501   Sepsis Labs Invalid input(s): PROCALCITONIN,  WBC,  LACTICIDVEN Microbiology Recent Results (from the past 240 hour(s))  SARS CORONAVIRUS 2 (TAT 6-24 HRS) Nasopharyngeal Nasopharyngeal Swab     Status: None   Collection Time: 06/27/19  2:15 AM   Specimen: Nasopharyngeal Swab  Result Value Ref Range Status   SARS Coronavirus 2 NEGATIVE NEGATIVE  Final    Comment: (NOTE) SARS-CoV-2 target nucleic acids are NOT DETECTED. The SARS-CoV-2 RNA is generally detectable in upper and lower respiratory specimens during the acute phase of infection. Negative results do not preclude SARS-CoV-2 infection, do not rule out co-infections with other pathogens, and should not be used as the sole basis for treatment or other patient management decisions. Negative results must be combined with clinical observations, patient history, and epidemiological information. The expected result is Negative. Fact Sheet for Patients: HairSlick.no Fact Sheet for Healthcare Providers: quierodirigir.com This test is not yet approved or cleared by the [place] FDA and  has been authorized for detection and/or diagnosis of SARS-CoV-2 by FDA under an Emergency Use Authorization (EUA). This EUA will remain  in effect  (meaning this test can be used) for the duration of the COVID-19 declaration under Section 56 4(b)(1) of the Act, 21 U.S.C. section 360bbb-3(b)(1), unless the authorization is terminated or revoked sooner. Performed at [facility], [address], [place], [place] [address]   Culture, blood (routine x 2) Call MD if unable to obtain prior to antibiotics being given     Status: None (Preliminary result)   Collection Time: 06/27/19  3:40 AM   Specimen: BLOOD  Result Value Ref Range Status   Specimen Description BLOOD RIGHT ANTECUBITAL  Final   Special Requests   Final    BOTTLES DRAWN AEROBIC AND ANAEROBIC Blood Culture adequate volume   Culture   Final    NO GROWTH 3 DAYS Performed at [facility], [address], [place], [place] [address]    Report Status PENDING  Incomplete  Culture, blood (routine x 2) Call MD if unable to obtain prior to antibiotics being given     Status: None (Preliminary result)   Collection Time: 06/27/19  4:20 AM   Specimen: BLOOD  Result Value Ref Range Status   Specimen Description BLOOD LEFT ANTECUBITAL  Final   Special Requests   Final    BOTTLES DRAWN AEROBIC AND ANAEROBIC Blood Culture adequate volume   Culture   Final    NO GROWTH 3 DAYS Performed at [facility], [address], [place], [place] [address]    Report Status PENDING  Incomplete  Urine Culture     Status: None   Collection Time: 06/27/19 11:15 AM   Specimen: Urine, Random  Result Value Ref Range Status   Specimen Description URINE, RANDOM  Final   Special Requests NONE  Final   Culture   Final    NO GROWTH Performed at [facility], [address], [place], [place] [address]    Report Status 06/28/2019 FINAL  Final  Culture, sputum-assessment     Status: None   Collection Time: 06/27/19  3:48 PM   Specimen: Expectorated Sputum  Result Value Ref Range Status   Specimen Description EXPECTORATED SPUTUM  Final   Special Requests NONE  Final   Sputum  evaluation   Final    THIS SPECIMEN IS ACCEPTABLE FOR SPUTUM CULTURE Performed at [facility], [address], [place], [place] [address]    Report Status 06/27/2019 FINAL  Final  Culture, respiratory     Status: None (Preliminary result)   Collection Time: 06/27/19  3:48 PM  Result Value Ref Range Status   Specimen Description EXPECTORATED SPUTUM  Final   Special Requests NONE Reflexed from [id number]  Final   Gram Stain   Final    ABUNDANT WBC  PRESENT, PREDOMINANTLY PMN FEW SQUAMOUS EPITHELIAL CELLS PRESENT ABUNDANT GRAM POSITIVE COCCI ABUNDANT GRAM POSITIVE RODS MODERATE GRAM NEGATIVE RODS    Culture   Final    CULTURE REINCUBATED FOR BETTER GROWTH Performed at [facility], [address] [place], [place] [address]    Report Status PENDING  Incomplete  Respiratory Panel by PCR     Status: None   Collection Time: 06/27/19  5:25 PM   Specimen: Nasopharyngeal Swab; Respiratory  Result Value Ref Range Status   Adenovirus NOT DETECTED NOT DETECTED Final   Coronavirus 229E NOT DETECTED NOT DETECTED Final    Comment: (NOTE) The Coronavirus on the Respiratory Panel, DOES NOT test for the novel  Coronavirus (2019 nCoV)    Coronavirus HKU1 NOT DETECTED NOT DETECTED Final   Coronavirus NL63 NOT DETECTED NOT DETECTED Final   Coronavirus OC43 NOT DETECTED NOT DETECTED Final   Metapneumovirus NOT DETECTED NOT DETECTED Final   Rhinovirus / Enterovirus NOT DETECTED NOT DETECTED Final   Influenza A NOT DETECTED NOT DETECTED Final   Influenza B NOT DETECTED NOT DETECTED Final   Parainfluenza Virus 1 NOT DETECTED NOT DETECTED Final   Parainfluenza Virus 2 NOT DETECTED NOT DETECTED Final   Parainfluenza Virus 3 NOT DETECTED NOT DETECTED Final   Parainfluenza Virus 4 NOT DETECTED NOT DETECTED Final   Respiratory Syncytial Virus NOT DETECTED NOT DETECTED Final   Bordetella pertussis NOT DETECTED NOT DETECTED Final   Chlamydophila pneumoniae NOT DETECTED NOT DETECTED Final    Mycoplasma pneumoniae NOT DETECTED NOT DETECTED Final    Comment: Performed at [facility], [address], [place], [place] [address]     Time coordinating discharge in minutes: 65  SIGNED:   Calvert Cantor, MD  [facility] 06/30/2019, 9:43 AM Pager   If 7PM-7AM, please contact night-coverage [email or web address] Password [email or web address]

## 2019-07-01 NOTE — Progress Notes (Signed)
Patient ID: Henry Stanley, male   DOB: 02/21/1976, 43 y.o.   MRN: [id number]  Virtual Visit via Telephone Note  I connected with [name] on 07/02/19 at  1:30 PM EDT by telephone and verified that I am speaking with the correct person using two identifiers.   I discussed the limitations, risks, security and privacy concerns of performing an evaluation and management service by telephone and the availability of in person appointments. I also discussed with the patient that there may be a patient responsible charge related to this service. The patient expressed understanding and agreed to proceed.  Patient location:  home My Location:  [facility] office Persons on the call:  Me and the patient   History of Present Illness: After hospitalization 10/15-10/19/2020.  He is feeling better overall with less muscle pain.  He is still sore in his abdomen.  He is starting to have withdrawal because he was taking Xanax daily prior to admission.  No h/o seizures.  He is finished antibiotics.  No fever.  He plans to start attending 12 step recovery meetings and has information.  He denies SI/HI.  Family is supportive.  He is complaining of mild nausea.  Appetite is improving.  From discharge summary: Discharge Diagnoses:  Principal Problem:   Drug overdose Active Problems:   Acute respiratory failure -  Lobar pneumonia  Acute renal failure superimposed on stage 2 chronic kidney disease (HCC)   AKI (acute kidney injury)   Polysubstance abuse (HCC)   Hyperkalemia   Elevated troponin   Abnormal LFTs   Hypothermia       Brief Summary: Henry Stanley a 43 y./o male with h/o cocaine abuse in the past, anxiety/ depression, who presented to the ED on 10/15 He presented after he snorted 1 gram of cocaine and heroin and drank a pint of liquor In ED >pulse ox 84% on room air, Somnolent. Given Narcan. Vomited afterwards. Had tightness in left chest and coughed up blood.  Labs> K 6.4, Cr 2.8,  Troponin 2121, CK 11,202, WBC 13.4, ETOH < 10 CXR>RUL infiltrate.  RR in 47s, HR > 100  Hospital Course:  Principal Problem: Acute respiratory failure - lobar pneumonia- RUL-leukocytosis, tachycardia and tachypnea with mild hypothermia>sepsis - acute respiratory failure due to drug OD and pneumonia - no longer coughing up blood - has RUL pneumonia- on 2 L 02- has been weaned to room air - on Zithromax and Ceftriaxone--plan for 5 day course- last dose tomorrow- case manager assisting with obtaining meds - blood cultures negative- sputum culture is still pending  - resp panel and Influenza neg - COVID neg   Active Problems: AKI (acute kidney injury) with secondary hyperkalemia - in setting of rhabdomyolysis and cocaine use - baseline Cr ~ 1.1- 1.2 - Cr as high as 3.21 and K 7.1 - both improving  - appreciate nephrology assisting with management - treated with Isotonic Bicarb and then NS-  Cr now 1.78, CO2 21 - patient is adamant about being discharged today- Nephrology recommends he drink 4 L of water daily and have renal function and CK checked in 2 days- I have consulted the case manager and we have arranged blood work and follow up  Mild LVH and diastolic CHF - not fluid overloaded- see ECHO report below- outpt follow up  Rhabdomyolysis - due to cocaine- improvingand now ~ 3043  Elevated troponin - Likely related to Rhabdo and cocaine use- not MI/ ACS per cardiology who has signed off  Abnormal LFTs - may be related to rhabdo - Hepatitis panel neg - LFTs are improving- f/u on 2 days  Mild thrombocytopenia - ? due to sepsis-also improving- f/u CBC in 2 days  Hypomagnesemia - replaced  Hypophophatemia - will receive more K phos today  Anemia  - outpt follow up  Polysubstance abuse - he has been counseled - social work consult requested  - he states he is afraid to use drugs again- he states he has a lot of support through his mosque     Observations/Objective: NAD.  A&Ox3 speech is clear and linear  Assessment and Plan: 1. Acute respiratory failure with hypoxia (HCC) resolved  2. Drug overdose, undetermined intent, subsequent encounter I have counseled the patient at length about substance abuse and addiction.  12 step meetings/recovery recommended.  Local 12 step meeting lists were given and attendance was encouraged.  Patient expresses understanding.  - Ambulatory referral to Social Work  3. Benzodiazepine withdrawal without complication (HCC) -Librium taper over 6 days - Comprehensive metabolic panel - Ambulatory referral to Social Work  4. Leukocytosis, unspecified type - CBC with Differential/Platelet  5. Non-traumatic rhabdomyolysis Drink 80-100 ounces water daily - Comprehensive metabolic panel - CK  6. AKI (acute kidney injury) (HCC) - Comprehensive metabolic panel  7. Nausea - ondansetron (ZOFRAN) 8 MG tablet; Take 1 tablet (8 mg total) by mouth every 8 (eight) hours as needed for nausea or vomiting.  Dispense: 20 tablet; Refill: 0  8. Hospital discharge follow-up improving    Follow Up Instructions: Assign PCP in 4 weeks;  Sooner if needed   I discussed the assessment and treatment plan with the patient. The patient was provided an opportunity to ask questions and all were answered. The patient agreed with the plan and demonstrated an understanding of the instructions.   The patient was advised to call back or seek an in-person evaluation if the symptoms worsen or if the condition fails to improve as anticipated.  I provided 15 minutes of non-face-to-face time during this encounter.   Georgian Co, PA-C

## 2019-07-02 ENCOUNTER — Ambulatory Visit: Payer: Self-pay

## 2019-07-02 ENCOUNTER — Other Ambulatory Visit: Payer: Self-pay

## 2019-07-02 ENCOUNTER — Ambulatory Visit: Payer: Self-pay | Attending: Nurse Practitioner | Admitting: Physician Assistant

## 2019-07-02 DIAGNOSIS — J9601 Acute respiratory failure with hypoxia: Secondary | ICD-10-CM

## 2019-07-02 DIAGNOSIS — T50994A Poisoning by other drugs, medicaments and biological substances, undetermined, initial encounter: Secondary | ICD-10-CM

## 2019-07-02 DIAGNOSIS — F13930 Sedative, hypnotic or anxiolytic use, unspecified with withdrawal, uncomplicated: Secondary | ICD-10-CM

## 2019-07-02 DIAGNOSIS — F13230 Sedative, hypnotic or anxiolytic dependence with withdrawal, uncomplicated: Secondary | ICD-10-CM

## 2019-07-02 DIAGNOSIS — Z09 Encounter for follow-up examination after completed treatment for conditions other than malignant neoplasm: Secondary | ICD-10-CM

## 2019-07-02 DIAGNOSIS — N179 Acute kidney failure, unspecified: Secondary | ICD-10-CM

## 2019-07-02 DIAGNOSIS — D72829 Elevated white blood cell count, unspecified: Secondary | ICD-10-CM

## 2019-07-02 DIAGNOSIS — R110 Nausea: Secondary | ICD-10-CM

## 2019-07-02 DIAGNOSIS — T50904D Poisoning by unspecified drugs, medicaments and biological substances, undetermined, subsequent encounter: Secondary | ICD-10-CM

## 2019-07-02 DIAGNOSIS — M6282 Rhabdomyolysis: Secondary | ICD-10-CM

## 2019-07-02 LAB — CULTURE, BLOOD (ROUTINE X 2)
Culture: NO GROWTH
Culture: NO GROWTH
Special Requests: ADEQUATE
Special Requests: ADEQUATE

## 2019-07-02 LAB — CULTURE, RESPIRATORY W GRAM STAIN

## 2019-07-02 MED ORDER — CHLORDIAZEPOXIDE HCL 10 MG PO CAPS: ORAL_CAPSULE | ORAL | 0 refills | Status: DC

## 2019-07-02 MED ORDER — ONDANSETRON HCL 8 MG PO TABS: 8.0000 mg | ORAL_TABLET | Freq: Three times a day (TID) | ORAL | 0 refills | Status: DC | PRN

## 2019-07-02 NOTE — Progress Notes (Signed)
Per pt he thinks he's going through withdraws from Xanax and having bad headaches,   Fatigue

## 2019-07-03 ENCOUNTER — Encounter (HOSPITAL_COMMUNITY): Payer: Self-pay

## 2019-07-03 ENCOUNTER — Encounter (HOSPITAL_COMMUNITY): Payer: Self-pay | Admitting: Emergency Medicine

## 2019-07-03 ENCOUNTER — Other Ambulatory Visit: Payer: Self-pay

## 2019-07-03 ENCOUNTER — Emergency Department (HOSPITAL_COMMUNITY)
Admission: EM | Admit: 2019-07-03 | Discharge: 2019-07-03 | Disposition: A | Discharge status: Home / Self Care | Payer: Self-pay | Attending: Emergency Medicine | Admitting: Emergency Medicine

## 2019-07-03 ENCOUNTER — Ambulatory Visit (HOSPITAL_COMMUNITY)
Admission: EM | Admit: 2019-07-03 | Discharge: 2019-07-03 | Disposition: A | Discharge status: Home / Self Care | Payer: Self-pay | Attending: Family Medicine | Admitting: Family Medicine

## 2019-07-03 DIAGNOSIS — R519 Headache, unspecified: Secondary | ICD-10-CM | POA: Insufficient documentation

## 2019-07-03 DIAGNOSIS — Z5321 Procedure and treatment not carried out due to patient leaving prior to being seen by health care provider: Secondary | ICD-10-CM | POA: Insufficient documentation

## 2019-07-03 LAB — CBC WITH DIFFERENTIAL/PLATELET
Basophils Absolute: 0 10*3/uL (ref 0.0–0.2)
Basos: 1 %
EOS (ABSOLUTE): 0.2 10*3/uL (ref 0.0–0.4)
Eos: 3 %
Hematocrit: 31.5 % — ABNORMAL LOW (ref 37.5–51.0)
Hemoglobin: 10.5 g/dL — ABNORMAL LOW (ref 13.0–17.7)
Immature Grans (Abs): 0 10*3/uL (ref 0.0–0.1)
Immature Granulocytes: 0 %
Lymphocytes Absolute: 1.6 10*3/uL (ref 0.7–3.1)
Lymphs: 25 %
MCH: 31.7 pg (ref 26.6–33.0)
MCHC: 33.3 g/dL (ref 31.5–35.7)
MCV: 95 fL (ref 79–97)
Monocytes Absolute: 0.9 10*3/uL (ref 0.1–0.9)
Monocytes: 14 %
Neutrophils Absolute: 3.7 10*3/uL (ref 1.4–7.0)
Neutrophils: 57 %
Platelets: 180 10*3/uL (ref 150–450)
RBC: 3.31 x10E6/uL — ABNORMAL LOW (ref 4.14–5.80)
RDW: 11.8 % (ref 11.6–15.4)
WBC: 6.4 10*3/uL (ref 3.4–10.8)

## 2019-07-03 LAB — COMPREHENSIVE METABOLIC PANEL
ALT: 57 IU/L — ABNORMAL HIGH (ref 0–44)
AST: 33 IU/L (ref 0–40)
Albumin/Globulin Ratio: 1.4 (ref 1.2–2.2)
Albumin: 3.3 g/dL — ABNORMAL LOW (ref 4.0–5.0)
Alkaline Phosphatase: 64 IU/L (ref 39–117)
BUN/Creatinine Ratio: 7 — ABNORMAL LOW (ref 9–20)
BUN: 12 mg/dL (ref 6–24)
Bilirubin Total: 0.4 mg/dL (ref 0.0–1.2)
CO2: 24 mmol/L (ref 20–29)
Calcium: 9 mg/dL (ref 8.7–10.2)
Chloride: 108 mmol/L — ABNORMAL HIGH (ref 96–106)
Creatinine, Ser: 1.79 mg/dL — ABNORMAL HIGH (ref 0.76–1.27)
GFR calc Af Amer: 52 mL/min/{1.73_m2} — ABNORMAL LOW (ref >59)
GFR calc non Af Amer: 45 mL/min/{1.73_m2} — ABNORMAL LOW (ref >59)
Globulin, Total: 2.4 g/dL (ref 1.5–4.5)
Glucose: 89 mg/dL (ref 65–99)
Potassium: 4.2 mmol/L (ref 3.5–5.2)
Sodium: 141 mmol/L (ref 134–144)
Total Protein: 5.7 g/dL — ABNORMAL LOW (ref 6.0–8.5)

## 2019-07-03 LAB — CK: Total CK: 806 U/L (ref 49–439)

## 2019-07-03 MED ORDER — METOCLOPRAMIDE HCL 5 MG/ML IJ SOLN
INTRAMUSCULAR | Status: AC
  Filled 2019-07-03: qty 2

## 2019-07-03 MED ORDER — KETOROLAC TROMETHAMINE 60 MG/2ML IM SOLN
60.0000 mg | Freq: Once | INTRAMUSCULAR | Status: AC
  Administered 2019-07-03: 14:00:00 60 mg via INTRAMUSCULAR

## 2019-07-03 MED ORDER — METOCLOPRAMIDE HCL 5 MG/ML IJ SOLN
5.0000 mg | Freq: Once | INTRAMUSCULAR | Status: AC
  Administered 2019-07-03: 5 mg via INTRAMUSCULAR

## 2019-07-03 MED ORDER — DEXAMETHASONE SODIUM PHOSPHATE 10 MG/ML IJ SOLN
10.0000 mg | Freq: Once | INTRAMUSCULAR | Status: AC
  Administered 2019-07-03: 10 mg via INTRAMUSCULAR

## 2019-07-03 MED ORDER — DEXAMETHASONE SODIUM PHOSPHATE 10 MG/ML IJ SOLN
INTRAMUSCULAR | Status: AC
  Filled 2019-07-03: qty 1

## 2019-07-03 MED ORDER — KETOROLAC TROMETHAMINE 60 MG/2ML IM SOLN
INTRAMUSCULAR | Status: AC
  Filled 2019-07-03: qty 2

## 2019-07-03 NOTE — ED Triage Notes (Signed)
Patient reports persistent headache for several days with nausea and photophobia , denies head injury, recently discharge from this hospital with prescription medications with no relief.

## 2019-07-03 NOTE — ED Notes (Signed)
Called pt multiple times for room, no answer.

## 2019-07-03 NOTE — ED Triage Notes (Signed)
Pt presents to UC w/ c/o headache x3-4 days, since being d/c from hospital. Pt reports taking xanax last night for headache w/o relief of pain. Pt reports lack of sleep and appetite d/t headache.

## 2019-07-03 NOTE — ED Notes (Signed)
Sharyn Lull EMT calling pt for room, no answer.

## 2019-07-03 NOTE — ED Provider Notes (Signed)
[place]    CSN: [id number] Arrival date & time: 07/03/19  1230      History   Chief Complaint No chief complaint on file.   HPI Henry Stanley is a 43 y.o. male.   Patient is a 43 year old male past medical history of anxiety, cocaine abuse, depression, GERD, polysubstance abuse, seizures, drug overdose, hyperkalemia, elevated troponin, acute renal failure, lobar pneumonia, accidental overdose of heroin, benzo withdrawal.  He presents today with severe 10/10 headache.  This is been present over the past 3 to 4 days.  The headache has been constant.  It is located to frontal area and describes as throbbing.  Had similar headache day before being discharged from the hospital.  Was thought to be possible drug withdrawal so they gave him Ativan which helped.  Last night he took a Xanax thinking that would help but it did not.  He is also currently taking Librium for withdrawal that was prescribed by his primary care physician.  No known head injuries.  He was on blood thinners in the hospital but typically does not take blood thinners daily.  He has been taking Tylenol without any relief.  No fever, neck pain, cough, chest congestion, body aches or night sweats.  No dizziness.  Mild photophobia.  ROS per HPI      Past Medical History:  Diagnosis Date  . Anxiety   . Cocaine abuse ([place])   . Depression   . GERD (gastroesophageal reflux disease)   . Polysubstance abuse ([place])   . Seizures ([place])   . Sinus infection     Patient Active Problem List   Diagnosis Date Noted  . Drug overdose 06/27/2019  . Hyperkalemia 06/27/2019  . Elevated troponin 06/27/2019  . Leukocytosis 06/27/2019  . Abnormal LFTs 06/27/2019  . Hypothermia 06/27/2019  . Acute renal failure superimposed on stage 2 chronic kidney disease ([place]) 06/27/2019  . Lobar pneumonia ([place]) 06/27/2019  . Accidental overdose of heroin ([place])   . AKI (acute kidney injury) ([place])   . Drug ingestion 06/23/2014  .  Polysubstance abuse ([place]) 01/18/2014  . Benzodiazepine withdrawal ([place]) 01/18/2014  . Chest pain 01/18/2014  . Paresthesia 01/18/2014  . Acute respiratory failure ([place]) 12/16/2012  . Cocaine abuse ([place]) 12/16/2012  . Depression 12/16/2012  . Perirectal abscess 09/22/2011    Past Surgical History:  Procedure Laterality Date  . FEMUR SURGERY         Home Medications    Prior to Admission medications   Medication Sig Start Date End Date Taking? Authorizing Provider  bismuth subsalicylate (PEPTO BISMOL) 262 MG/15ML suspension Take 30 mLs by mouth every 6 (six) hours as needed for indigestion or diarrhea or loose stools.    [provider]  chlordiazePOXIDE (LIBRIUM) 10 MG capsule Take 2 tabs 3 times daily X 2 days, take 1 tab 3 times daily X 2 days, take 1/2 tab 3 times daily X 2 days 07/02/19   Argentina Donovan, PA-C  ondansetron (ZOFRAN) 8 MG tablet Take 1 tablet (8 mg total) by mouth every 8 (eight) hours as needed for nausea or vomiting. 07/02/19   Argentina Donovan, PA-C    Family History Family History  Problem Relation Age of Onset  . Hypertension Mother     Social History Social History   Tobacco Use  . Smoking status: Current Every Day Smoker    Packs/day: 0.50    Types: Cigarettes  . Smokeless tobacco: Current User  Substance Use Topics  .  Alcohol use: Yes    Comment: 2 times a week.   . Drug use: Yes    Types: Cocaine, Marijuana, Benzodiazepines    Comment: last used 07/12/17     Allergies   Sumatriptan, Acetaminophen, and Benadryl [diphenhydramine hcl]   Review of Systems Review of Systems   Physical Exam Triage Vital Signs ED Triage Vitals  Enc Vitals Group     BP 07/03/19 1331 (!) 164/95     Pulse Rate 07/03/19 1331 73     Resp 07/03/19 1331 18     Temp 07/03/19 1331 97.9 F (36.6 C)     Temp Source 07/03/19 1331 Oral     SpO2 07/03/19 1331 100 %     Weight --      Height --      Head Circumference --      Peak Flow --       Pain Score 07/03/19 1330 10     Pain Loc --      Pain Edu? --      Excl. in GC? --    No data found.  Updated Vital Signs BP (!) 164/95 (BP Location: Right Arm)   Pulse 73   Temp 97.9 F (36.6 C) (Oral)   Resp 18   SpO2 100%   Visual Acuity Right Eye Distance:   Left Eye Distance:   Bilateral Distance:    Right Eye Near:   Left Eye Near:    Bilateral Near:     Physical Exam Vitals signs and nursing note reviewed.  Constitutional:      General: He is not in acute distress.    Appearance: He is not ill-appearing, toxic-appearing or diaphoretic.     Comments: Appears in pain.   HENT:     Head: Normocephalic and atraumatic.     Nose: Nose normal.  Eyes:     General: No scleral icterus.    Extraocular Movements: Extraocular movements intact.     Pupils: Pupils are equal, round, and reactive to light.     Comments: Bilateral scleral injection   Neck:     Musculoskeletal: Normal range of motion and neck supple. No neck rigidity or muscular tenderness.  Cardiovascular:     Pulses: Normal pulses.     Heart sounds: Normal heart sounds.  Pulmonary:     Effort: Pulmonary effort is normal.  Musculoskeletal: Normal range of motion.  Lymphadenopathy:     Cervical: No cervical adenopathy.  Skin:    General: Skin is warm and dry.  Neurological:     General: No focal deficit present.     Mental Status: He is alert.     Comments: Cranial nerves grossly intact Strength 5 out of 5 in all extremities. No slurred speech, extremity weakness, facial droop   Psychiatric:        Mood and Affect: Mood normal.      UC Treatments / Results  Labs (all labs ordered are listed, but only abnormal results are displayed) Labs Reviewed - No data to display  EKG   Radiology No results found.  Procedures Procedures (including critical care time)  Medications Ordered in UC Medications  ketorolac (TORADOL) injection 60 mg (60 mg Intramuscular Given 07/03/19 1423)   metoCLOPramide (REGLAN) injection 5 mg (5 mg Intramuscular Given 07/03/19 1423)  dexamethasone (DECADRON) injection 10 mg (10 mg Intramuscular Given 07/03/19 1422)  dexamethasone (DECADRON) 10 MG/ML injection (has no administration in time range)  ketorolac (TORADOL) 60 MG/2ML injection (has no administration  in time range)  metoCLOPramide (REGLAN) 5 MG/ML injection (has no administration in time range)    Initial Impression / Assessment and Plan / UC Course  I have reviewed the triage vital signs and the nursing notes.  Pertinent labs & imaging results that were available during my care of the patient were reviewed by me and considered in my medical decision making (see chart for details).     Patient here today with severe headache that has been persistent over the past 4 to 5 days. Patient with recent hospital admission for heroin overdose. Also possible withdrawals from benzos Currently taking Librium There was some concern for intracranial abnormality based on the severe headache 10/10 but is less likely based on the fact that is been approximately 5 days and patient is neurologically intact. Most likely from withdrawals. Migraine cocktail given here in clinic. He can continue the Zofran as needed for nausea, vomiting at home.  He can continue Tylenol as needed. Strict return precautions that if symptoms do not improve or worsen over the next 24 hours healing to go straight to the hospital.  Patient understanding and agree. Final Clinical Impressions(s) / UC Diagnoses   Final diagnoses:  Acute intractable headache, unspecified headache type     Discharge Instructions     We gave you some medicine here for your headache.  Go home and rest.  Make sure you are drinking plenty of fluids and staying hydrated He can take Tylenol extra strength as needed If your symptoms continue or worsen you need to go straight to the ER.    ED Prescriptions    None     PDMP not reviewed  this encounter.   Janace ArisBast, Dionne Rossa A, NP 07/03/19 1505

## 2019-07-03 NOTE — Discharge Instructions (Addendum)
We gave you some medicine here for your headache.  Go home and rest.  Make sure you are drinking plenty of fluids and staying hydrated He can take Tylenol extra strength as needed If your symptoms continue or worsen you need to go straight to the ER.

## 2019-07-03 NOTE — ED Triage Notes (Signed)
Pt states he believes it is a "xanax" withdrawal

## 2019-07-22 ENCOUNTER — Inpatient Hospital Stay: Payer: Self-pay | Admitting: Nurse Practitioner

## 2019-07-23 ENCOUNTER — Ambulatory Visit: Payer: Self-pay | Attending: Family Medicine | Admitting: Licensed Clinical Social Worker

## 2019-07-23 ENCOUNTER — Other Ambulatory Visit: Payer: Self-pay

## 2019-07-23 DIAGNOSIS — F1910 Other psychoactive substance abuse, uncomplicated: Secondary | ICD-10-CM

## 2019-07-23 NOTE — BH Specialist Note (Addendum)
Integrated Behavioral Health Initial Visit  MRN: [id number] Name: Henry Stanley  Number of [place] Clinician visits:: 1/6 Session Start time: 1:45  Session End time: 2:25 Total time: 40   Type of Service: [place] Interpretor:No. Interpretor Name and Language: N/A   SUBJECTIVE: Henry Stanley is a 43 y.o. male accompanied by self Patient was referred by Henry Stanley for polysubstance use disorder. Patient reports the following symptoms/concerns: Recent hospitalization after overdose on heroin and cocaine 06/26/19  Duration of problem: Ongoing; Severity of problem: severe  OBJECTIVE: Mood: Dysphoric and Affect: Appropriate and Blunt Risk of harm to self or others: No plan to harm self or others  LIFE CONTEXT: Family and Social: Pt lives with partner who is 7 month pregnant (due in January) and 34 year old son. Pt has 4 sons (2 do not live in the home with him). Pt reports his recent overdose scared him and he does not want to use substances but reports his friends and environment are not conducive to recovery.   School/Work: Pt is unemployed. Pt is uninsured. Pt reports he has recently been hired for a job but is waiting to begin work until he is medically cleared.  Self-Care: Pt reports he has not used substances since cocaine/heroin overdose on 06/26/19. Pt reports difficulty remaining sober. He is currently sleeping, praying, and driving on the highway to distract himself.  Life Changes: Pt recently overdosed on heroin and cocaine 06/26/19 and was hospitalized for several days as a result. Pt reports he has been receiving messages lately. He believes he is getting signs he "is going to die" if he does not change his substance use.   GOALS ADDRESSED: Patient will: 1. Reduce symptoms of: anxiety, depression and substance use cravings 2. Increase knowledge and/or ability of: coping skills and self-management skills  3. Demonstrate  ability to: Increase adequate support systems for patient/family  INTERVENTIONS: Interventions utilized: Motivational Interviewing, Supportive Counseling and Link to [employer]  Standardized Assessments completed: GAD-7 and PHQ 2&9  ASSESSMENT: Patient currently experiencing symptoms of anxiety and depression triggered by recent cessation of polysubstance use after overdose and resulting hospitalization on 06/26/19. Pt reports he overdosed on heroin and cocaine. Pt reports a history of using xanax, cocaine, alcohol, and marijuana. Pt reports he was dependent on xanax until recent hospitalization where he was detoxed from substance.   Pt reports he has been "thinking weird" lately - pt reports it is difficult to explain but reports he feels like he is being sent signs that he "is going to die" if he does not stop using substances. Pt reports he views addiction as a disease. Pt also reports he is afraid to use substances again due to recent overdose but does not know how to remain sober long-term when strong cravings arise.   MSW Intern provided psycho education about the instability of mood when withdrawing from substances and during early recovery. MSW Intern encouraged pt to use coping skills discussed in session when cravings arise. Pt expressed interest in trying 12-step, stating he is confident he will attend a meeting but is unsure if he will maintain attendance.    Patient may benefit from [facility] counseling services, 12-step recovery program engagement, and medication management. MSW Intern provided in person and online resources for 12-step meetings in community. MSW Intern encouraged pt to discuss medication options at his upcoming appointment with PCP 08/01/19 if interested. Pt additionally has a large bill from recent hospital stay -  MSW Intern encouraged pt to contact hospital to remove Medicaid potential flag and contact DSS to apply for Medicaid. MSW Intern informed pt  he can apply for CFA if he is denied Medicaid coverage. Pt states he is not interested in inpatient treatment at this time because of his family responsibilities (upcoming birth of son).   PLAN: 1. Follow up with behavioral health clinician on : Pt encouraged to contact MSW Intern or LCSW after initial meeting attendance to inform them of goodness of fit for a treatment option.  2. Behavioral recommendations: MSW Intern encouraged pt to use relapse prevention coping skills discussed in session, attend 12-step meetings, and discuss medication management at upcoming PCP apt.  3. Referral(s): Community Resources:  12-Step Meeting Info 4. "From scale of 1-10, how likely are you to follow plan?": 10  **If 12-step meetings are not a good fit, MSW Intern will inform pt of [facility] support and re-assess pt interest in inpatient treatment options.   Norberto Sorenson  MSW Intern 07/23/2019   5:36pm

## 2019-07-25 ENCOUNTER — Other Ambulatory Visit: Payer: Self-pay

## 2019-07-25 ENCOUNTER — Ambulatory Visit (INDEPENDENT_AMBULATORY_CARE_PROVIDER_SITE_OTHER): Payer: Self-pay | Admitting: Cardiovascular Disease

## 2019-07-25 ENCOUNTER — Encounter: Payer: Self-pay | Admitting: Cardiovascular Disease

## 2019-07-25 VITALS — BP 120/80 | HR 80 | Ht 66.0 in | Wt 165.2 lb

## 2019-07-25 DIAGNOSIS — I440 Atrioventricular block, first degree: Secondary | ICD-10-CM

## 2019-07-25 DIAGNOSIS — R0789 Other chest pain: Secondary | ICD-10-CM

## 2019-07-25 NOTE — Patient Instructions (Signed)
Medication Instructions:  Continue same medications *If you need a refill on your cardiac medications before your next appointment, please call your pharmacy*  Lab Work: None ordered   Testing/Procedures: None ordered  Follow-Up: At [facility], you and your health needs are our priority.  As part of our continuing mission to provide you with exceptional heart care, we have created designated Provider Care Teams.  These Care Teams include your primary Cardiologist (physician) and Advanced Practice Providers (APPs -  Physician Assistants and Nurse Practitioners) who all work together to provide you with the care you need, when you need it.  Your next appointment:  6 months   The format for your next appointment:  Office   Provider:  Dr.O'Neil

## 2019-07-25 NOTE — Progress Notes (Signed)
Cardiology Office Note:   Date:  07/25/2019  NAME:  Henry Stanley    MRN: [id number] DOB:  04-01-1976   PCP:  Patient, No Pcp Per  Cardiologist:  No primary care provider on file.   Referring MD: Laurell Josephsruby, Lauren K, MD   Chief Complaint  Patient presents with   Hospitalization Follow-up   History of Present Illness:   Henry Stanley is a 43 y.o. male with a hx of cocaine abuse who is being seen today for the evaluation of elevated troponin in setting of drug overdose. He was admitted 10/15-10/19 with cocaine/heroin overdose and found to have acute renal failure/PNA. He was found to have an elevated troponin but normal EKG. His echocardiogram showed normal LVEF and normal all motion.  He reports he is doing well since leaving the hospital.  He states he is refraining from cocaine, heroin, alcohol abuse.  He reports he is started to walk a bit and has no major limitations.  He does report he was a bit short of breath with activity once he left the hospital but this is gotten better.  He still describes no episodes of chest pain with exertion, no palpitations, no extremity edema, no cardiovascular symptoms.  His EKG demonstrates normal sinus rhythm with a first-degree AV block, PR interval up to 346 ms, no acute ST-T changes, no evidence of prior infarction.  I reviewed his echocardiogram and the results from the hospitalization with him.  He has no further questions.  Past Medical History: Past Medical History:  Diagnosis Date   Anxiety    Chronic kidney disease    Cocaine abuse (HCC)    Depression    GERD (gastroesophageal reflux disease)    Polysubstance abuse (HCC)    Seizures (HCC)    Sinus infection     Past Surgical History: Past Surgical History:  Procedure Laterality Date   FEMUR SURGERY      Current Medications: Current Meds  Medication Sig   bismuth subsalicylate (PEPTO BISMOL) 262 MG/15ML suspension Take 30 mLs by mouth every 6 (six) hours as needed for  indigestion or diarrhea or loose stools.   chlordiazePOXIDE (LIBRIUM) 10 MG capsule Take 2 tabs 3 times daily X 2 days, take 1 tab 3 times daily X 2 days, take 1/2 tab 3 times daily X 2 days   ondansetron (ZOFRAN) 8 MG tablet Take 1 tablet (8 mg total) by mouth every 8 (eight) hours as needed for nausea or vomiting.     Allergies:    Sumatriptan, Acetaminophen, and Benadryl [diphenhydramine hcl]   Social History: Social History   Socioeconomic History   Marital status: Significant Other    Spouse name: Not on file   Number of children: Not on file   Years of education: Not on file   Highest education level: Not on file  Occupational History   Not on file  Social Needs   Financial resource strain: Not on file   Food insecurity    Worry: Not on file    Inability: Not on file   Transportation needs    Medical: Not on file    Non-medical: Not on file  Tobacco Use   Smoking status: Former Smoker    Packs/day: 0.50    Types: Cigarettes   Smokeless tobacco: Current User  Substance and Sexual Activity   Alcohol use: Not Currently    Comment: 2 times a week.    Drug use: Yes    Types: Cocaine, Marijuana, Benzodiazepines  Comment: last used 07/12/17   Sexual activity: Yes    Birth control/protection: Condom  Lifestyle   Physical activity    Days per week: Not on file    Minutes per session: Not on file   Stress: Not on file  Relationships   Social connections    Talks on phone: Not on file    Gets together: Not on file    Attends religious service: Not on file    Active member of club or organization: Not on file    Attends meetings of clubs or organizations: Not on file    Relationship status: Not on file  Other Topics Concern   Not on file  Social History Narrative   ** Merged History Encounter **         Family History: The patient's family history includes Cancer in his mother; Hypertension in his mother.  ROS:   All other ROS reviewed  and negative. Pertinent positives noted in the HPI.     EKGs/Labs/Other Studies Reviewed:   The following studies were personally reviewed by me today:  EKG:  EKG is ordered today.  The ekg ordered today demonstrates normal sinus rhythm, heart rate 80, first-degree AV block (346 ms, no acute ST-T changes, no evidence of prior infarction), and was personally reviewed by me.   TTE 06/27/2019  1. Left ventricular ejection fraction, by visual estimation, is 60 to 65%. The left ventricle has normal function. There is mildly increased left ventricular hypertrophy.  2. Left ventricular diastolic Doppler parameters are consistent with pseudonormalization pattern of LV diastolic filling.  3. Global right ventricle has normal systolic function.The right ventricular size is normal. No increase in right ventricular wall thickness.  4. Left atrial size was normal.  5. Right atrial size was normal.  6. The mitral valve is normal in structure. Mild mitral valve regurgitation.  7. The tricuspid valve is normal in structure. Tricuspid valve regurgitation is trivial.  8. The aortic valve is normal in structure. Aortic valve regurgitation was not visualized by color flow Doppler. Structurally normal aortic valve, with no evidence of sclerosis or stenosis.  9. The pulmonic valve was normal in structure. Pulmonic valve regurgitation is not visualized by color flow Doppler. 10. The atrial septum is grossly normal.  Recent Labs: 06/27/2019: B Natriuretic Peptide 343.2; TSH 0.642 06/29/2019: Magnesium 2.3 07/02/2019: ALT 57; BUN 12; Creatinine, Ser 1.79; Hemoglobin 10.5; Platelets 180; Potassium 4.2; Sodium 141   Recent Lipid Panel    Component Value Date/Time   CHOL 171 06/27/2019 0445   TRIG 75 06/27/2019 0445   HDL 53 06/27/2019 0445   CHOLHDL 3.2 06/27/2019 0445   VLDL 15 06/27/2019 0445   LDLCALC 103 (H) 06/27/2019 0445    Physical Exam:   VS:  BP 120/80    Pulse 80    Ht 5\' 6"  (1.676 m)    Wt 165  lb 4 oz (75 kg)    BMI 26.67 kg/m    Wt Readings from Last 3 Encounters:  07/25/19 165 lb 4 oz (75 kg)  06/30/19 177 lb 4.8 oz (80.4 kg)  09/27/18 160 lb (72.6 kg)    General: Well nourished, well developed, in no acute distress Heart: Atraumatic, normal size  Eyes: PEERLA, EOMI  Neck: Supple, no JVD Endocrine: No thryomegaly Cardiac: Normal S1, S2; RRR; no murmurs, rubs, or gallops Lungs: Clear to auscultation bilaterally, no wheezing, rhonchi or rales  Abd: Soft, nontender, no hepatomegaly  Ext: No edema, pulses 2+ Musculoskeletal:  No deformities, BUE and BLE strength normal and equal Skin: Warm and dry, no rashes   Neuro: Alert and oriented to person, place, time, and situation, CNII-XII grossly intact, no focal deficits  Psych: Normal mood and affect   ASSESSMENT:   Henry Stanley is a 43 y.o. male who presents for the following: 1. Other chest pain   2. 1st degree AV block     PLAN:   1. Other chest pain 2. 1st degree AV block -He was admitted to the hospital in October with a drug overdose and likely pneumonia.  He had a non-MI troponin elevation in the setting of this.  There were no symptoms to suggest an acute coronary syndrome and he had no EKG changes to suggest this either.  His echocardiogram showed normal left ventricular ejection fraction and no wall motion abnormalities.  His EKG today demonstrates no acute ST-T changes and no evidence of prior infarction.  He does have a impressive first-degree AV block, but no symptoms from this.  He also has no symptoms from a cardiac standpoint and is getting back to his usual self.  I think for now we will hold on any other testing at this time and just see how he does clinically.  I have cleared him to go back to work without limitations.  Given his lack of symptoms we will plan to see him back in about 6 months to reentertain any needed testing.  For now on review of his laboratory data cholesterol is within normal limits and is  not diabetic.  He will continue to refrain from substance abuse and we will follow him clinically.  Disposition: Return in about 6 months (around 01/22/2020).  Medication Adjustments/Labs and Tests Ordered: Current medicines are reviewed at length with the patient today.  Concerns regarding medicines are outlined above.  No orders of the defined types were placed in this encounter.  No orders of the defined types were placed in this encounter.   Patient Instructions  Medication Instructions:  Continue same medications *If you need a refill on your cardiac medications before your next appointment, please call your pharmacy*  Lab Work: None ordered   Testing/Procedures: None ordered  Follow-Up: At [facility], you and your health needs are our priority.  As part of our continuing mission to provide you with exceptional heart care, we have created designated Provider Care Teams.  These Care Teams include your primary Cardiologist (physician) and Advanced Practice Providers (APPs -  Physician Assistants and Nurse Practitioners) who all work together to provide you with the care you need, when you need it.  Your next appointment:  6 months   The format for your next appointment:  Office   Provider:  Dr.O'Neil        Signed, Addison Naegeli. Audie Box, [place]  [address] [place], [place] [address] [phone]  07/25/2019 8:53 AM

## 2019-07-28 ENCOUNTER — Telehealth: Payer: Self-pay | Admitting: Licensed Clinical Social Worker

## 2019-07-28 NOTE — Telephone Encounter (Signed)
Call placed to pt to f/u regarding Medicaid Pot status from recent hospital encounter. MSW Intern encouraged pt to contact the hospital he was admitted to and see if he can complete a Medicaid application with hospital staff. If unable to, MSW Intern encouraged pt to apply for Medicaid via DSS.  MSW Intern shared information about [employer] counseling services with pt and encouraged pt to contact MSW Intern if he is in need to additional resources/support.

## 2019-08-01 ENCOUNTER — Ambulatory Visit: Payer: Self-pay | Attending: Family Medicine | Admitting: Family Medicine

## 2019-08-01 ENCOUNTER — Other Ambulatory Visit: Payer: Self-pay

## 2019-08-01 ENCOUNTER — Encounter: Payer: Self-pay | Admitting: Family Medicine

## 2019-08-01 DIAGNOSIS — R7989 Other specified abnormal findings of blood chemistry: Secondary | ICD-10-CM

## 2019-08-01 DIAGNOSIS — R0789 Other chest pain: Secondary | ICD-10-CM

## 2019-08-01 DIAGNOSIS — F1911 Other psychoactive substance abuse, in remission: Secondary | ICD-10-CM

## 2019-08-01 DIAGNOSIS — D649 Anemia, unspecified: Secondary | ICD-10-CM

## 2019-08-01 DIAGNOSIS — K219 Gastro-esophageal reflux disease without esophagitis: Secondary | ICD-10-CM

## 2019-08-01 DIAGNOSIS — N183 Chronic kidney disease, stage 3 unspecified: Secondary | ICD-10-CM

## 2019-08-01 DIAGNOSIS — R945 Abnormal results of liver function studies: Secondary | ICD-10-CM

## 2019-08-01 DIAGNOSIS — Z9189 Other specified personal risk factors, not elsewhere classified: Secondary | ICD-10-CM

## 2019-08-01 MED ORDER — OMEPRAZOLE 20 MG PO CPDR: 20.0000 mg | DELAYED_RELEASE_CAPSULE | Freq: Two times a day (BID) | ORAL | 3 refills | Status: DC

## 2019-08-01 MED ORDER — NALOXONE HCL 0.4 MG/ML IJ SOLN: INTRAMUSCULAR | 3 refills | Status: DC

## 2019-08-01 NOTE — Progress Notes (Signed)
Patient verified DOB Patient has eaten today. Patient has not taken medication today. Patient denies pain at this time.  

## 2019-08-01 NOTE — Progress Notes (Signed)
Virtual Visit via Telephone Note  I connected with Henry Stanley on 08/01/19 at  3:50 PM EST by telephone and verified that I am speaking with the correct person using two identifiers.   I discussed the limitations, risks, security and privacy concerns of performing an evaluation and management service by telephone and the availability of in person appointments. I also discussed with the patient that there may be a patient responsible charge related to this service. The patient expressed understanding and agreed to proceed.  Patient Location: Home Provider Location: [facility] Others participating in call: none   History of Present Illness:      43 yo male who is s/p hospitalization on 06/26/2019 through 06/30/2019 after an unintentional drug overdose while using cocaine and heroin, per the ED notes, patient's friends were not initially able to awaken him after his drug use however he was still breathing so they did not intervene. When patient woke up he developed the onset of nausea, vomiting and substernal chest pain. He was also given narcan in the ED as he remained drowsy. He was also reported to have had an episode of oxygen desaturation down to 84% which later improved to 96% on room air. He was also noted to have abnormal chest x-ray with right upper lobe pneumonia, hyperkalemia-potassium 6.4 and acute worsening of his chronic kidney disease with Cr of 7.1. He was admitted and received consultations from nephrology and cardiology. During hospitalization he was also noted to have mild LVH and diastolic CH, rhabdomyolysis which improved, mild thrombocytosis, anemia, abnormal LFT's, and received replacement therapy for low magnesium and low phosphate levels.  He was also counseled by social work regarding polysubstance abuse.  He was seen here in the office on 07/02/2019 to establish care status post hospitalization.  He had hemoglobin of 10.5 on blood work done at that visit.  On comprehensive  metabolic panel, his creatinine was elevated at 1.79 with GFR of 52.  Potassium level was normal.  Patient with ALT of 57 which had improved status post hospitalization.        At today's visit, patient states that he feels well.  He denies any issues with cough or shortness of breath.  He does feel as if he has had some acid reflux type symptoms as he has had some discomfort in the middle of his chest as well as burping, belching and he feels as if bad tasting liquid occasionally comes back into his throat.  He denies any left-sided chest pain and no radiating pain to the neck, jaw, arms or back.  He denies any abdominal pain-no nausea/vomiting/diarrhea or constipation.  No urinary frequency, urgency or dysuria.  No increased muscle or joint pain.  No peripheral edema.  He reports that he is trying to make better decisions.     Past Medical History:  Diagnosis Date   Anxiety    Chronic kidney disease    Cocaine abuse (HCC)    Depression    GERD (gastroesophageal reflux disease)    Polysubstance abuse (HCC)    Seizures (HCC)    Sinus infection     Past Surgical History:  Procedure Laterality Date   FEMUR SURGERY      Family History  Problem Relation Age of Onset   Hypertension Mother    Cancer Mother     Social History   Tobacco Use   Smoking status: Former Smoker    Packs/day: 0.50    Types: Cigarettes   Smokeless tobacco: Current  User  Substance Use Topics   Alcohol use: Not Currently    Comment: 2 times a week.    Drug use: Yes    Types: Cocaine, Marijuana, Benzodiazepines    Comment: last used 07/12/17     Allergies  Allergen Reactions   Sumatriptan Shortness Of Breath, Nausea And Vomiting, Palpitations and Other (See Comments)    Increased BP also   Acetaminophen Other (See Comments)    reaction to migraine cocktail - rectal burning and agitation   Benadryl [Diphenhydramine Hcl] Other (See Comments)     reaction to migraine cocktail - rectal  burning and agitation       Observations/Objective: No vital signs or physical exam conducted as visit was done via telephone  Assessment and Plan: 1. Atypical chest pain; 2. gastroesophageal reflux disease, unspecified whether esophagitis present Patient had cardiology evaluation during hospitalization.  Patient symptoms sound GI in nature.  Prescription will be provided for patient to take omeprazole twice daily to decrease stomach acid and discussed avoidance of spicy/greasy foods as well as avoidance of late night eating/eating within 2 hours of bedtime.  Patient should seek emergency room follow-up if he has continued or worsening chest pain otherwise patient has been asked to contact the office if his symptoms are not improving otherwise he will follow up in 3 months. - Comprehensive metabolic panel; Future - omeprazole (PRILOSEC) 20 MG capsule; Take 1 capsule (20 mg total) by mouth 2 (two) times daily. To decrease stomach acid  Dispense: 60 capsule; Refill: 3  3. History of substance abuse ([place]) 4. History of drug overdose Patient reports he continues to try and avoid drug use as he is afraid that he may die if he does drugs.  He did have some issues with Xanax dependence status post hospitalization but was able to stop the use of Xanax as well.  Patient did meet with social work after his hospitalization.  He was offered continued follow-up with social work which he states that he will consider.  Prescription provided for Narcan in the event that patient does have relapse with the use of substance abuse which could lead to acute respiratory failure.  He had acute on chronic elevation in creatinine during his hospitalization as well as increased LFTs due to his drug use and rhabdomyolysis and will have comprehensive metabolic panel at upcoming lab visit. - naloxone (NARCAN) 0.4 MG/ML injection; Use as directed for drug overdose  Dispense: 1 mL; Refill: 3  5. Anemia, unspecified type He will  have CBC at today's visit and follow-up of anemia.  Discussed with the patient that he may need GI follow-up if he has had any worsening of his anemia as he is also having complaint of some substernal chest pain which may be GERD related.  Patient also has chronic kidney disease which could be contributing to his anemia as well.  Patient has been asked to schedule lab visit and he will come in this Monday to have comprehensive metabolic panel and CBC in follow-up of anemia and chronic kidney disease. - Comprehensive metabolic panel; Future - CBC; Future  6. Stage 3 chronic kidney disease, unspecified whether stage 3a or 3b CKD; 7.  Abnormal LFTs Patient will return for lab visit in follow-up of chronic kidney disease and patient also during hospitalization had abnormal LFTs which have returned to near normal levels.  Most recent hemoglobin A1c was 1.79.  He will have comprehensive metabolic panel.  Discussed the possibility of the need for nephrology follow-up.  He is encouraged to continue to remain well-hydrated, avoid use of nonsteroidal anti-inflammatories as well as avoidance of use of illicit substances. - Comprehensive metabolic panel; Future  Follow Up Instructions:Return in about 3 months (around 11/01/2019).    I discussed the assessment and treatment plan with the patient. The patient was provided an opportunity to ask questions and all were answered. The patient agreed with the plan and demonstrated an understanding of the instructions.   The patient was advised to call back or seek an in-person evaluation if the symptoms worsen or if the condition fails to improve as anticipated.  I provided 15 minutes of non-face-to-face time during this encounter.   Cain Saupeammie Kearie Mennen, MD

## 2019-08-04 ENCOUNTER — Other Ambulatory Visit: Payer: Self-pay

## 2020-01-11 NOTE — Progress Notes (Deleted)
Cardiology Office Note:   Date:  01/11/2020  NAME:  Henry Stanley    MRN: [id number] DOB:  January 09, 1976   PCP:  Cain Saupe, MD  Cardiologist:  No primary care provider on file.  Electrophysiologist:  None   Referring MD: Cain Saupe, MD   No chief complaint on file. ***  History of Present Illness:   Henry Stanley is a 44 y.o. male with a hx of drug abuse, elevated troponin who presents for follow-up. Had extensive hospitalization in October of 2020 with multiorgan system failure in setting of drug overdose and PNA. HE had profound AKI and liver injury secondary to PNA from overdose. Elevated troponin but no CP or ischemic changes. He had rhabdomyolysis as well.   Problem List  1. Non-MI troponin elevation in setting of severe sepsis (06/2019) -troponin elevation due to AKI, rhabdomyloysis, sepsis 2/2 PNA -EF normal, EKG without ischemic changes    Past Medical History: Past Medical History:  Diagnosis Date  . Anxiety   . Chronic kidney disease   . Cocaine abuse (HCC)   . Depression   . GERD (gastroesophageal reflux disease)   . Polysubstance abuse (HCC)   . Seizures (HCC)   . Sinus infection     Past Surgical History: Past Surgical History:  Procedure Laterality Date  . FEMUR SURGERY      Current Medications: No outpatient medications have been marked as taking for the 01/12/20 encounter (Appointment) with O'Neal, Ronnald Ramp, MD.     Allergies:    Sumatriptan, Acetaminophen, and Benadryl [diphenhydramine hcl]   Social History: Social History   Socioeconomic History  . Marital status: Significant Other    Spouse name: Not on file  . Number of children: Not on file  . Years of education: Not on file  . Highest education level: Not on file  Occupational History  . Not on file  Tobacco Use  . Smoking status: Former Smoker    Packs/day: 0.50    Types: Cigarettes  . Smokeless tobacco: Current User  Substance and Sexual Activity  . Alcohol use: Not  Currently    Comment: 2 times a week.   . Drug use: Yes    Types: Cocaine, Marijuana, Benzodiazepines    Comment: last used 07/12/17  . Sexual activity: Yes    Birth control/protection: Condom  Other Topics Concern  . Not on file  Social History Narrative   ** Merged History Encounter **       Social Determinants of Health   Financial Resource Strain:   . Difficulty of Paying Living Expenses:   Food Insecurity:   . Worried About Programme researcher, broadcasting/film/video in the Last Year:   . Barista in the Last Year:   Transportation Needs:   . Freight forwarder (Medical):   Marland Kitchen Lack of Transportation (Non-Medical):   Physical Activity:   . Days of Exercise per Week:   . Minutes of Exercise per Session:   Stress:   . Feeling of Stress :   Social Connections:   . Frequency of Communication with Friends and Family:   . Frequency of Social Gatherings with Friends and Family:   . Attends Religious Services:   . Active Member of Clubs or Organizations:   . Attends Banker Meetings:   Marland Kitchen Marital Status:      Family History: The patient's ***family history includes Cancer in his mother; Hypertension in his mother.  ROS:   All other ROS reviewed  and negative. Pertinent positives noted in the HPI.     EKGs/Labs/Other Studies Reviewed:   The following studies were personally reviewed by me today:  EKG:  EKG is *** ordered today.  The ekg ordered today demonstrates ***, and was personally reviewed by me.   Recent Labs: 06/27/2019: B Natriuretic Peptide 343.2; TSH 0.642 06/29/2019: Magnesium 2.3 07/02/2019: ALT 57; BUN 12; Creatinine, Ser 1.79; Hemoglobin 10.5; Platelets 180; Potassium 4.2; Sodium 141   Recent Lipid Panel    Component Value Date/Time   CHOL 171 06/27/2019 0445   TRIG 75 06/27/2019 0445   HDL 53 06/27/2019 0445   CHOLHDL 3.2 06/27/2019 0445   VLDL 15 06/27/2019 0445   LDLCALC 103 (H) 06/27/2019 0445    Physical Exam:   VS:  There were no vitals  taken for this visit.   Wt Readings from Last 3 Encounters:  07/25/19 165 lb 4 oz (75 kg)  06/30/19 177 lb 4.8 oz (80.4 kg)  09/27/18 160 lb (72.6 kg)    General: Well nourished, well developed, in no acute distress Heart: Atraumatic, normal size  Eyes: PEERLA, EOMI  Neck: Supple, no JVD Endocrine: No thryomegaly Cardiac: Normal S1, S2; RRR; no murmurs, rubs, or gallops Lungs: Clear to auscultation bilaterally, no wheezing, rhonchi or rales  Abd: Soft, nontender, no hepatomegaly  Ext: No edema, pulses 2+ Musculoskeletal: No deformities, BUE and BLE strength normal and equal Skin: Warm and dry, no rashes   Neuro: Alert and oriented to person, place, time, and situation, CNII-XII grossly intact, no focal deficits  Psych: Normal mood and affect   ASSESSMENT:   Henry Stanley is a 44 y.o. male who presents for the following: No diagnosis found.  PLAN:   There are no diagnoses linked to this encounter.  Disposition: No follow-ups on file.  Medication Adjustments/Labs and Tests Ordered: Current medicines are reviewed at length with the patient today.  Concerns regarding medicines are outlined above.  No orders of the defined types were placed in this encounter.  No orders of the defined types were placed in this encounter.   There are no Patient Instructions on file for this visit.   Time Spent with Patient: I have spent a total of *** minutes with patient reviewing hospital notes, telemetry, EKGs, labs and examining the patient as well as establishing an assessment and plan that was discussed with the patient.  > 50% of time was spent in direct patient care.  Signed, Addison Naegeli. Audie Box, [place]  [address] [place], [place] [address] [phone]  01/11/2020 2:27 PM

## 2020-01-12 ENCOUNTER — Ambulatory Visit: Payer: Self-pay | Admitting: Cardiovascular Disease

## 2020-01-12 DIAGNOSIS — I440 Atrioventricular block, first degree: Secondary | ICD-10-CM

## 2020-01-12 DIAGNOSIS — R0789 Other chest pain: Secondary | ICD-10-CM

## 2020-02-29 NOTE — Progress Notes (Deleted)
Cardiology Office Note:   Date:  02/29/2020  NAME:  Henry Stanley    MRN: [id number] DOB:  [birth year]   PCP:  Antony Blackbird, MD  Cardiologist:  No primary care provider on file.  Electrophysiologist:  None   Referring MD: Antony Blackbird, MD   No chief complaint on file. ***  History of Present Illness:   Henry Stanley is a 44 y.o. male with a hx of cocaine abuse who presents for follow-up. He was admitted in October for drug overdose, PNA, and AKI and had elevated troponin. He had normal EKG and normal echo. Due to no symptoms in follow-up appointment, we opted for no further testing.   Problem List 1. Cocaine abuse 2. Elevated troponin -admit 10/15-10/19 with drug overdose/AKI/PNA 3. 1AVB  Past Medical History: Past Medical History:  Diagnosis Date  . Anxiety   . Chronic kidney disease   . Cocaine abuse ([place])   . Depression   . GERD (gastroesophageal reflux disease)   . Polysubstance abuse ([place])   . Seizures ([place])   . Sinus infection     Past Surgical History: Past Surgical History:  Procedure Laterality Date  . FEMUR SURGERY      Current Medications: No outpatient medications have been marked as taking for the 03/02/20 encounter (Appointment) with O'Neal, Cassie Freer, MD.     Allergies:    Sumatriptan, Acetaminophen, and Benadryl [diphenhydramine hcl]   Social History: Social History   Socioeconomic History  . Marital status: Significant Other    Spouse name: Not on file  . Number of children: Not on file  . Years of education: Not on file  . Highest education level: Not on file  Occupational History  . Not on file  Tobacco Use  . Smoking status: Former Smoker    Packs/day: 0.50    Types: Cigarettes  . Smokeless tobacco: Current User  Substance and Sexual Activity  . Alcohol use: Not Currently    Comment: 2 times a week.   . Drug use: Yes    Types: Cocaine, Marijuana, Benzodiazepines    Comment: last used 07/12/17  . Sexual activity: Yes     Birth control/protection: Condom  Other Topics Concern  . Not on file  Social History Narrative   ** Merged History Encounter **       Social Determinants of Health   Financial Resource Strain:   . Difficulty of Paying Living Expenses:   Food Insecurity:   . Worried About Charity fundraiser in the Last Year:   . Arboriculturist in the Last Year:   Transportation Needs:   . Film/video editor (Medical):   Marland Kitchen Lack of Transportation (Non-Medical):   Physical Activity:   . Days of Exercise per Week:   . Minutes of Exercise per Session:   Stress:   . Feeling of Stress :   Social Connections:   . Frequency of Communication with Friends and Family:   . Frequency of Social Gatherings with Friends and Family:   . Attends Religious Services:   . Active Member of Clubs or Organizations:   . Attends Archivist Meetings:   Marland Kitchen Marital Status:      Family History: The patient's ***family history includes Cancer in his mother; Hypertension in his mother.  ROS:   All other ROS reviewed and negative. Pertinent positives noted in the HPI.     EKGs/Labs/Other Studies Reviewed:   The following studies were personally reviewed by me  today:  EKG:  EKG is *** ordered today.  The ekg ordered today demonstrates ***, and was personally reviewed by me.   TTE 06/27/2019 1. Left ventricular ejection fraction, by visual estimation, is 60 to  65%. The left ventricle has normal function. There is mildly increased  left ventricular hypertrophy.  2. Left ventricular diastolic Doppler parameters are consistent with  pseudonormalization pattern of LV diastolic filling.  3. Global right ventricle has normal systolic function.The right  ventricular size is normal. No increase in right ventricular wall  thickness.  4. Left atrial size was normal.  5. Right atrial size was normal.  6. The mitral valve is normal in structure. Mild mitral valve  regurgitation.  7. The tricuspid valve  is normal in structure. Tricuspid valve  regurgitation is trivial.  8. The aortic valve is normal in structure. Aortic valve regurgitation  was not visualized by color flow Doppler. Structurally normal aortic  valve, with no evidence of sclerosis or stenosis.  9. The pulmonic valve was normal in structure. Pulmonic valve  regurgitation is not visualized by color flow Doppler.  10. The atrial septum is grossly normal.   Recent Labs: 06/27/2019: B Natriuretic Peptide 343.2; TSH 0.642 06/29/2019: Magnesium 2.3 07/02/2019: ALT 57; BUN 12; Creatinine, Ser 1.79; Hemoglobin 10.5; Platelets 180; Potassium 4.2; Sodium 141   Recent Lipid Panel    Component Value Date/Time   CHOL 171 06/27/2019 0445   TRIG 75 06/27/2019 0445   HDL 53 06/27/2019 0445   CHOLHDL 3.2 06/27/2019 0445   VLDL 15 06/27/2019 0445   LDLCALC 103 (H) 06/27/2019 0445    Physical Exam:   VS:  There were no vitals taken for this visit.   Wt Readings from Last 3 Encounters:  07/25/19 165 lb 4 oz (75 kg)  06/30/19 177 lb 4.8 oz (80.4 kg)  09/27/18 160 lb (72.6 kg)    General: Well nourished, well developed, in no acute distress Heart: Atraumatic, normal size  Eyes: PEERLA, EOMI  Neck: Supple, no JVD Endocrine: No thryomegaly Cardiac: Normal S1, S2; RRR; no murmurs, rubs, or gallops Lungs: Clear to auscultation bilaterally, no wheezing, rhonchi or rales  Abd: Soft, nontender, no hepatomegaly  Ext: No edema, pulses 2+ Musculoskeletal: No deformities, BUE and BLE strength normal and equal Skin: Warm and dry, no rashes   Neuro: Alert and oriented to person, place, time, and situation, CNII-XII grossly intact, no focal deficits  Psych: Normal mood and affect   ASSESSMENT:   Henry Stanley is a 44 y.o. male who presents for the following: No diagnosis found.  PLAN:   There are no diagnoses linked to this encounter.  Disposition: No follow-ups on file.  Medication Adjustments/Labs and Tests Ordered: Current  medicines are reviewed at length with the patient today.  Concerns regarding medicines are outlined above.  No orders of the defined types were placed in this encounter.  No orders of the defined types were placed in this encounter.   There are no Patient Instructions on file for this visit.   Time Spent with Patient: I have spent a total of *** minutes with patient reviewing hospital notes, telemetry, EKGs, labs and examining the patient as well as establishing an assessment and plan that was discussed with the patient.  > 50% of time was spent in direct patient care.  Signed, Lenna Gilford. Flora Lipps, MD [facility]  [address] [place], [place] [address] [phone]  02/29/2020 2:02 PM

## 2020-03-02 ENCOUNTER — Ambulatory Visit: Payer: Self-pay | Admitting: Cardiovascular Disease

## 2020-03-03 ENCOUNTER — Ambulatory Visit: Payer: Self-pay | Admitting: Cardiovascular Disease

## 2020-03-03 NOTE — Progress Notes (Deleted)
Cardiology Office Note:   Date:  03/03/2020  NAME:  Henry Stanley    MRN: [id number] DOB:  [birth year]   PCP:  Antony Blackbird, MD  Cardiologist:  No primary care provider on file.  Electrophysiologist:  None   Referring MD: Antony Blackbird, MD   No chief complaint on file. ***  History of Present Illness:   Henry Stanley is a 44 y.o. male with a hx of drug abuse who presents for follow-up. He was admitted 10/15-10/19/2020 for cocaine and heroin overdose. His course was complicated by PNA, rhabdomyolysis, and AKI. He had an elevated troponin but normal EKG and no chest pain. He does have a 1AVB but his ekg is unremarkable. His echocardiogram was normal without WMA. He was seen in follow-up in November of 2020 and had returned to baseline. .  Past Medical History: Past Medical History:  Diagnosis Date  . Anxiety   . Chronic kidney disease   . Cocaine abuse ([place])   . Depression   . GERD (gastroesophageal reflux disease)   . Polysubstance abuse ([place])   . Seizures ([place])   . Sinus infection     Past Surgical History: Past Surgical History:  Procedure Laterality Date  . FEMUR SURGERY      Current Medications: No outpatient medications have been marked as taking for the 03/03/20 encounter (Appointment) with O'Neal, Cassie Freer, MD.     Allergies:    Sumatriptan, Acetaminophen, and Benadryl [diphenhydramine hcl]   Social History: Social History   Socioeconomic History  . Marital status: Significant Other    Spouse name: Not on file  . Number of children: Not on file  . Years of education: Not on file  . Highest education level: Not on file  Occupational History  . Not on file  Tobacco Use  . Smoking status: Former Smoker    Packs/day: 0.50    Types: Cigarettes  . Smokeless tobacco: Current User  Substance and Sexual Activity  . Alcohol use: Not Currently    Comment: 2 times a week.   . Drug use: Yes    Types: Cocaine, Marijuana, Benzodiazepines    Comment: last  used 07/12/17  . Sexual activity: Yes    Birth control/protection: Condom  Other Topics Concern  . Not on file  Social History Narrative   ** Merged History Encounter **       Social Determinants of Health   Financial Resource Strain:   . Difficulty of Paying Living Expenses:   Food Insecurity:   . Worried About Charity fundraiser in the Last Year:   . Arboriculturist in the Last Year:   Transportation Needs:   . Film/video editor (Medical):   Marland Kitchen Lack of Transportation (Non-Medical):   Physical Activity:   . Days of Exercise per Week:   . Minutes of Exercise per Session:   Stress:   . Feeling of Stress :   Social Connections:   . Frequency of Communication with Friends and Family:   . Frequency of Social Gatherings with Friends and Family:   . Attends Religious Services:   . Active Member of Clubs or Organizations:   . Attends Archivist Meetings:   Marland Kitchen Marital Status:      Family History: The patient's ***family history includes Cancer in his mother; Hypertension in his mother.  ROS:   All other ROS reviewed and negative. Pertinent positives noted in the HPI.     EKGs/Labs/Other Studies Reviewed:  The following studies were personally reviewed by me today:  EKG:  EKG is *** ordered today.  The ekg ordered today demonstrates ***, and was personally reviewed by me.   TTE 06/27/2019 1. Left ventricular ejection fraction, by visual estimation, is 60 to  65%. The left ventricle has normal function. There is mildly increased  left ventricular hypertrophy.  2. Left ventricular diastolic Doppler parameters are consistent with  pseudonormalization pattern of LV diastolic filling.  3. Global right ventricle has normal systolic function.The right  ventricular size is normal. No increase in right ventricular wall  thickness.  4. Left atrial size was normal.  5. Right atrial size was normal.  6. The mitral valve is normal in structure. Mild mitral valve    regurgitation.  7. The tricuspid valve is normal in structure. Tricuspid valve  regurgitation is trivial.  8. The aortic valve is normal in structure. Aortic valve regurgitation  was not visualized by color flow Doppler. Structurally normal aortic  valve, with no evidence of sclerosis or stenosis.  9. The pulmonic valve was normal in structure. Pulmonic valve  regurgitation is not visualized by color flow Doppler.  10. The atrial septum is grossly normal.   Recent Labs: 06/27/2019: B Natriuretic Peptide 343.2; TSH 0.642 06/29/2019: Magnesium 2.3 07/02/2019: ALT 57; BUN 12; Creatinine, Ser 1.79; Hemoglobin 10.5; Platelets 180; Potassium 4.2; Sodium 141   Recent Lipid Panel    Component Value Date/Time   CHOL 171 06/27/2019 0445   TRIG 75 06/27/2019 0445   HDL 53 06/27/2019 0445   CHOLHDL 3.2 06/27/2019 0445   VLDL 15 06/27/2019 0445   LDLCALC 103 (H) 06/27/2019 0445    Physical Exam:   VS:  There were no vitals taken for this visit.   Wt Readings from Last 3 Encounters:  07/25/19 165 lb 4 oz (75 kg)  06/30/19 177 lb 4.8 oz (80.4 kg)  09/27/18 160 lb (72.6 kg)    General: Well nourished, well developed, in no acute distress Heart: Atraumatic, normal size  Eyes: PEERLA, EOMI  Neck: Supple, no JVD Endocrine: No thryomegaly Cardiac: Normal S1, S2; RRR; no murmurs, rubs, or gallops Lungs: Clear to auscultation bilaterally, no wheezing, rhonchi or rales  Abd: Soft, nontender, no hepatomegaly  Ext: No edema, pulses 2+ Musculoskeletal: No deformities, BUE and BLE strength normal and equal Skin: Warm and dry, no rashes   Neuro: Alert and oriented to person, place, time, and situation, CNII-XII grossly intact, no focal deficits  Psych: Normal mood and affect   ASSESSMENT:   Henry Stanley is a 44 y.o. male who presents for the following: No diagnosis found.  PLAN:   There are no diagnoses linked to this encounter.  Disposition: No follow-ups on file.  Medication  Adjustments/Labs and Tests Ordered: Current medicines are reviewed at length with the patient today.  Concerns regarding medicines are outlined above.  No orders of the defined types were placed in this encounter.  No orders of the defined types were placed in this encounter.   There are no Patient Instructions on file for this visit.   Time Spent with Patient: I have spent a total of *** minutes with patient reviewing hospital notes, telemetry, EKGs, labs and examining the patient as well as establishing an assessment and plan that was discussed with the patient.  > 50% of time was spent in direct patient care.  Signed, Lenna Gilford. Flora Lipps, MD [facility]  [address], [address] [place], [place]  [address] [phone]  03/03/2020 6:31 AM

## 2020-04-12 ENCOUNTER — Encounter (HOSPITAL_BASED_OUTPATIENT_CLINIC_OR_DEPARTMENT_OTHER): Payer: Self-pay | Admitting: *Deleted

## 2020-04-12 ENCOUNTER — Emergency Department (HOSPITAL_BASED_OUTPATIENT_CLINIC_OR_DEPARTMENT_OTHER)
Admission: EM | Admit: 2020-04-12 | Discharge: 2020-04-12 | Disposition: A | Discharge status: Home / Self Care | Payer: Self-pay | Attending: Emergency Medicine | Admitting: Emergency Medicine

## 2020-04-12 ENCOUNTER — Encounter (HOSPITAL_COMMUNITY): Payer: Self-pay | Admitting: Emergency Medicine

## 2020-04-12 ENCOUNTER — Other Ambulatory Visit: Payer: Self-pay

## 2020-04-12 ENCOUNTER — Emergency Department (HOSPITAL_COMMUNITY)
Admission: EM | Admit: 2020-04-12 | Discharge: 2020-04-12 | Disposition: A | Discharge status: Home / Self Care | Payer: Self-pay | Attending: Emergency Medicine | Admitting: Emergency Medicine

## 2020-04-12 DIAGNOSIS — R0789 Other chest pain: Secondary | ICD-10-CM | POA: Insufficient documentation

## 2020-04-12 DIAGNOSIS — R112 Nausea with vomiting, unspecified: Secondary | ICD-10-CM | POA: Insufficient documentation

## 2020-04-12 DIAGNOSIS — Z5321 Procedure and treatment not carried out due to patient leaving prior to being seen by health care provider: Secondary | ICD-10-CM | POA: Insufficient documentation

## 2020-04-12 LAB — CBC
HCT: 40.2 % (ref 39.0–52.0)
Hemoglobin: 13.8 g/dL (ref 13.0–17.0)
MCH: 32.2 pg (ref 26.0–34.0)
MCHC: 34.3 g/dL (ref 30.0–36.0)
MCV: 93.9 fL (ref 80.0–100.0)
Platelets: 216 10*3/uL (ref 150–400)
RBC: 4.28 MIL/uL (ref 4.22–5.81)
RDW: 11.5 % (ref 11.5–15.5)
WBC: 7.8 10*3/uL (ref 4.0–10.5)
nRBC: 0 % (ref 0.0–0.2)

## 2020-04-12 LAB — BASIC METABOLIC PANEL
Anion gap: 17 — ABNORMAL HIGH (ref 5–15)
BUN: 16 mg/dL (ref 6–20)
CO2: 18 mmol/L — ABNORMAL LOW (ref 22–32)
Calcium: 10.3 mg/dL (ref 8.9–10.3)
Chloride: 106 mmol/L (ref 98–111)
Creatinine, Ser: 1.34 mg/dL — ABNORMAL HIGH (ref 0.61–1.24)
GFR calc Af Amer: >60 mL/min (ref >60)
GFR calc non Af Amer: >60 mL/min (ref >60)
Glucose, Bld: 91 mg/dL (ref 70–99)
Potassium: 3.8 mmol/L (ref 3.5–5.1)
Sodium: 141 mmol/L (ref 135–145)

## 2020-04-12 LAB — TROPONIN I (HIGH SENSITIVITY): Troponin I (High Sensitivity): 4 ng/L (ref <18)

## 2020-04-12 MED ORDER — SODIUM CHLORIDE 0.9% FLUSH: 3.0000 mL | Freq: Once | INTRAVENOUS | Status: DC

## 2020-04-12 NOTE — ED Notes (Signed)
Pt walked out with 2 other gentlemen stating that he is going to go to [employer].

## 2020-04-12 NOTE — ED Triage Notes (Signed)
Pt in w/[facility] after snorting "a lot of cocaine" at noon today. States he also had some Oxy prior to this. Tachy in triage 130's.

## 2020-04-12 NOTE — ED Triage Notes (Signed)
Per EMS: pt started having N/V and body aches which started at 1400.  Pt has been on cocaine binge.  Pt was at [facility] today, Left without being seen but had EKG and labs done prior to leaving.

## 2020-04-12 NOTE — ED Triage Notes (Signed)
Brought in by EMs for n/v , LWBS at [place] today for CP

## 2020-05-25 ENCOUNTER — Encounter: Payer: Self-pay | Admitting: Family Medicine

## 2020-06-23 ENCOUNTER — Encounter: Payer: Self-pay | Admitting: Family Medicine

## 2020-06-23 ENCOUNTER — Ambulatory Visit: Payer: Self-pay | Attending: Family Medicine | Admitting: Family Medicine

## 2020-06-23 ENCOUNTER — Other Ambulatory Visit: Payer: Self-pay

## 2020-06-23 VITALS — BP 118/79 | HR 79 | Ht 66.0 in | Wt 163.0 lb

## 2020-06-23 DIAGNOSIS — Z0000 Encounter for general adult medical examination without abnormal findings: Secondary | ICD-10-CM

## 2020-06-23 DIAGNOSIS — Z13228 Encounter for screening for other metabolic disorders: Secondary | ICD-10-CM

## 2020-06-23 DIAGNOSIS — Z23 Encounter for immunization: Secondary | ICD-10-CM

## 2020-06-23 NOTE — Patient Instructions (Signed)

## 2020-06-23 NOTE — Progress Notes (Signed)
Subjective:  Patient ID: Henry Stanley, male    DOB: 04-03-1976  Age: 44 y.o. MRN: [id number]  CC: Annual Exam   HPI Damean Poffenberger Rosencrans is a 44 year old male patient of Dr.Fulp here for complete physical exam. He has a family history of lung cancer and his mom who recently passed.  He is a previous smoker but does not smoker who has quit. He has not been too compliant with a regular exercise regimen and is not particular about complying with a healthy diet. Past Medical History:  Diagnosis Date  . Anxiety   . Chronic kidney disease   . Cocaine abuse ([place])   . Depression   . GERD (gastroesophageal reflux disease)   . Polysubstance abuse ([place])   . Seizures ([place])   . Sinus infection     Past Surgical History:  Procedure Laterality Date  . FEMUR SURGERY      Family History  Problem Relation Age of Onset  . Hypertension Mother   . Cancer Mother     Allergies  Allergen Reactions  . Sumatriptan Shortness Of Breath, Nausea And Vomiting, Palpitations and Other (See Comments)    Increased BP also  . Acetaminophen Other (See Comments)    reaction to migraine cocktail - rectal burning and agitation  . Benadryl [Diphenhydramine Hcl] Other (See Comments)     reaction to migraine cocktail - rectal burning and agitation    Outpatient Medications Prior to Visit  Medication Sig Dispense Refill  . bismuth subsalicylate (PEPTO BISMOL) 262 MG/15ML suspension Take 30 mLs by mouth every 6 (six) hours as needed for indigestion or diarrhea or loose stools. (Patient not taking: Reported on 06/23/2020)    . naloxone [facility]) 0.4 MG/ML injection Use as directed for drug overdose (Patient not taking: Reported on 06/23/2020) 1 mL 3  . omeprazole (PRILOSEC) 20 MG capsule Take 1 capsule (20 mg total) by mouth 2 (two) times daily. To decrease stomach acid (Patient not taking: Reported on 06/23/2020) 60 capsule 3  . ondansetron (ZOFRAN) 8 MG tablet Take 1 tablet (8 mg total) by mouth every 8 (eight)  hours as needed for nausea or vomiting. (Patient not taking: Reported on 08/01/2019) 20 tablet 0   No facility-administered medications prior to visit.     ROS Review of Systems  Constitutional: Negative for activity change and appetite change.  HENT: Negative for sinus pressure and sore throat.   Eyes: Negative for visual disturbance.  Respiratory: Negative for cough, chest tightness and shortness of breath.   Cardiovascular: Negative for chest pain and leg swelling.  Gastrointestinal: Negative for abdominal distention, abdominal pain, constipation and diarrhea.  Endocrine: Negative.   Genitourinary: Negative for dysuria.  Musculoskeletal: Negative for joint swelling and myalgias.  Skin: Negative for rash.  Allergic/Immunologic: Negative.   Neurological: Negative for weakness, light-headedness and numbness.  Psychiatric/Behavioral: Negative for dysphoric mood and suicidal ideas.    Objective:  BP 118/79   Pulse 79   Ht $R'5\' 6"'gF$  (1.676 m)   Wt 163 lb (73.9 kg)   SpO2 98%   BMI 26.31 kg/m   BP/Weight 06/23/2020 0/02/2375 10/19/3149  Systolic BP 761 607 371  Diastolic BP 79 95 92  Wt. (Lbs) 163 160 165.35  BMI 26.31 25.82 26.69  Some encounter information is confidential and restricted. Go to Review Flowsheets activity to see all data.      Physical Exam Constitutional:      Appearance: He is well-developed.  HENT:     Head: Normocephalic  and atraumatic.     Right Ear: External ear normal.     Left Ear: External ear normal.  Eyes:     Conjunctiva/sclera: Conjunctivae normal.     Pupils: Pupils are equal, round, and reactive to light.  Neck:     Vascular: No JVD.     Trachea: No tracheal deviation.  Cardiovascular:     Rate and Rhythm: Normal rate and regular rhythm.     Heart sounds: Normal heart sounds. No murmur heard.   Pulmonary:     Effort: Pulmonary effort is normal. No respiratory distress.     Breath sounds: Normal breath sounds. No wheezing or rales.    Chest:     Chest wall: No tenderness.  Abdominal:     General: Bowel sounds are normal. There is no distension.     Palpations: Abdomen is soft. There is no mass.     Tenderness: There is no abdominal tenderness.  Genitourinary:    Penis: Normal.      Testes: Normal.  Musculoskeletal:        General: No tenderness. Normal range of motion.     Cervical back: Normal range of motion and neck supple.     Right lower leg: No edema.     Left lower leg: No edema.  Skin:    General: Skin is warm and dry.  Neurological:     Mental Status: He is alert and oriented to person, place, and time.  Psychiatric:        Mood and Affect: Mood normal.     CMP Latest Ref Rng & Units 04/12/2020 07/02/2019 06/30/2019  Glucose 70 - 99 mg/dL 91 89 91  BUN 6 - 20 mg/dL $Remove'16 12 14  'OBpGrrH$ Creatinine 0.61 - 1.24 mg/dL 1.34(H) 1.79(H) 1.78(H)  Sodium 135 - 145 mmol/L 141 141 138  Potassium 3.5 - 5.1 mmol/L 3.8 4.2 3.6  Chloride 98 - 111 mmol/L 106 108(H) 110  CO2 22 - 32 mmol/L 18(L) 24 21(L)  Calcium 8.9 - 10.3 mg/dL 10.3 9.0 8.7(L)  Total Protein 6.0 - 8.5 g/dL - 5.7(L) 5.6(L)  Total Bilirubin 0.0 - 1.2 mg/dL - 0.4 1.1  Alkaline Phos 39 - 117 IU/L - 64 51  AST 0 - 40 IU/L - 33 66(H)  ALT 0 - 44 IU/L - 57(H) 61(H)    Lipid Panel     Component Value Date/Time   CHOL 171 06/27/2019 0445   TRIG 75 06/27/2019 0445   HDL 53 06/27/2019 0445   CHOLHDL 3.2 06/27/2019 0445   VLDL 15 06/27/2019 0445   LDLCALC 103 (H) 06/27/2019 0445    CBC    Component Value Date/Time   WBC 7.8 04/12/2020 1220   RBC 4.28 04/12/2020 1220   HGB 13.8 04/12/2020 1220   HGB 10.5 (L) 07/02/2019 1412   HCT 40.2 04/12/2020 1220   HCT 31.5 (L) 07/02/2019 1412   PLT 216 04/12/2020 1220   PLT 180 07/02/2019 1412   MCV 93.9 04/12/2020 1220   MCV 95 07/02/2019 1412   MCH 32.2 04/12/2020 1220   MCHC 34.3 04/12/2020 1220   RDW 11.5 04/12/2020 1220   RDW 11.8 07/02/2019 1412   LYMPHSABS 1.6 07/02/2019 1412   MONOABS 0.7  06/28/2019 0453   EOSABS 0.2 07/02/2019 1412   BASOSABS 0.0 07/02/2019 1412    Lab Results  Component Value Date   HGBA1C 5.1 06/27/2019    Assessment & Plan:  1. Annual physical exam Counseled on 150 minutes of exercise  per week, healthy eating (including decreased daily intake of saturated fats, cholesterol, added sugars, sodium), routine healthcare maintenance.   2. Screening for metabolic disorder - Lipid panel; Future - CMP14+EGFR; Future - TSH; Future - CBC with Differential/Platelet; Future     No orders of the defined types were placed in this encounter.   Follow-up: Return in about 1 year (around 06/23/2021) for Annual physical with PCP.       Charlott Rakes, MD, FAAFP. [facility] and [place], [place]   06/23/2020, 3:39 PM

## 2020-06-25 ENCOUNTER — Other Ambulatory Visit: Payer: Self-pay

## 2020-06-29 ENCOUNTER — Ambulatory Visit: Payer: Self-pay | Attending: Family Medicine

## 2020-06-29 ENCOUNTER — Other Ambulatory Visit: Payer: Self-pay

## 2020-06-29 DIAGNOSIS — Z13228 Encounter for screening for other metabolic disorders: Secondary | ICD-10-CM

## 2020-06-30 LAB — CMP14+EGFR
ALT: 13 IU/L (ref 0–44)
AST: 13 IU/L (ref 0–40)
Albumin/Globulin Ratio: 2 (ref 1.2–2.2)
Albumin: 4.6 g/dL (ref 4.0–5.0)
Alkaline Phosphatase: 64 IU/L (ref 44–121)
BUN/Creatinine Ratio: 13 (ref 9–20)
BUN: 18 mg/dL (ref 6–24)
Bilirubin Total: 0.3 mg/dL (ref 0.0–1.2)
CO2: 24 mmol/L (ref 20–29)
Calcium: 9.5 mg/dL (ref 8.7–10.2)
Chloride: 107 mmol/L — ABNORMAL HIGH (ref 96–106)
Creatinine, Ser: 1.39 mg/dL — ABNORMAL HIGH (ref 0.76–1.27)
GFR calc Af Amer: 71 mL/min/{1.73_m2} (ref >59)
GFR calc non Af Amer: 61 mL/min/{1.73_m2} (ref >59)
Globulin, Total: 2.3 g/dL (ref 1.5–4.5)
Glucose: 101 mg/dL — ABNORMAL HIGH (ref 65–99)
Potassium: 4.6 mmol/L (ref 3.5–5.2)
Sodium: 140 mmol/L (ref 134–144)
Total Protein: 6.9 g/dL (ref 6.0–8.5)

## 2020-06-30 LAB — CBC WITH DIFFERENTIAL/PLATELET
Basophils Absolute: 0 10*3/uL (ref 0.0–0.2)
Basos: 1 %
EOS (ABSOLUTE): 0.2 10*3/uL (ref 0.0–0.4)
Eos: 3 %
Hematocrit: 38.4 % (ref 37.5–51.0)
Hemoglobin: 12.9 g/dL — ABNORMAL LOW (ref 13.0–17.7)
Immature Grans (Abs): 0 10*3/uL (ref 0.0–0.1)
Immature Granulocytes: 0 %
Lymphocytes Absolute: 2.9 10*3/uL (ref 0.7–3.1)
Lymphs: 44 %
MCH: 32.2 pg (ref 26.6–33.0)
MCHC: 33.6 g/dL (ref 31.5–35.7)
MCV: 96 fL (ref 79–97)
Monocytes Absolute: 0.4 10*3/uL (ref 0.1–0.9)
Monocytes: 6 %
Neutrophils Absolute: 3 10*3/uL (ref 1.4–7.0)
Neutrophils: 46 %
Platelets: 170 10*3/uL (ref 150–450)
RBC: 4.01 x10E6/uL — ABNORMAL LOW (ref 4.14–5.80)
RDW: 11.7 % (ref 11.6–15.4)
WBC: 6.6 10*3/uL (ref 3.4–10.8)

## 2020-06-30 LAB — LIPID PANEL
Chol/HDL Ratio: 5.3 ratio — ABNORMAL HIGH (ref 0.0–5.0)
Cholesterol, Total: 206 mg/dL — ABNORMAL HIGH (ref 100–199)
HDL: 39 mg/dL — ABNORMAL LOW (ref >39)
LDL Chol Calc (NIH): 140 mg/dL — ABNORMAL HIGH (ref 0–99)
Triglycerides: 148 mg/dL (ref 0–149)
VLDL Cholesterol Cal: 27 mg/dL (ref 5–40)

## 2020-06-30 LAB — TSH: TSH: 0.606 u[IU]/mL (ref 0.450–4.500)

## 2020-07-02 ENCOUNTER — Telehealth: Payer: Self-pay

## 2020-07-02 ENCOUNTER — Ambulatory Visit: Payer: Self-pay | Admitting: *Deleted

## 2020-07-02 NOTE — Telephone Encounter (Signed)
Pt given lab results per notes of Dr. Alvis Lemmings on 07/02/20. Pt verbalized understanding.

## 2020-07-02 NOTE — Telephone Encounter (Signed)
Call placed to patient and message states that call can not be completed at this time.  Letter has been mailed out to patient.

## 2020-07-02 NOTE — Telephone Encounter (Signed)
-----   Message from Hoy Register, MD sent at 06/30/2020  2:03 PM EDT ----- Labs are stable except for elevated cholesterol.  Please advise to work on a low-cholesterol diet and lifestyle modifications.  Due to his low cardiovascular risk no cholesterol medication need to be started at this time.

## 2021-05-31 ENCOUNTER — Emergency Department (HOSPITAL_COMMUNITY)
Admission: EM | Admit: 2021-05-31 | Discharge: 2021-06-01 | Disposition: A | Discharge status: Home / Self Care | Payer: Self-pay | Attending: Emergency Medicine | Admitting: Emergency Medicine

## 2021-05-31 ENCOUNTER — Other Ambulatory Visit: Payer: Self-pay

## 2021-05-31 ENCOUNTER — Encounter (HOSPITAL_COMMUNITY): Payer: Self-pay

## 2021-05-31 DIAGNOSIS — Z5321 Procedure and treatment not carried out due to patient leaving prior to being seen by health care provider: Secondary | ICD-10-CM | POA: Insufficient documentation

## 2021-05-31 DIAGNOSIS — K0889 Other specified disorders of teeth and supporting structures: Secondary | ICD-10-CM | POA: Insufficient documentation

## 2021-05-31 DIAGNOSIS — R2231 Localized swelling, mass and lump, right upper limb: Secondary | ICD-10-CM | POA: Insufficient documentation

## 2021-05-31 DIAGNOSIS — R110 Nausea: Secondary | ICD-10-CM | POA: Insufficient documentation

## 2021-05-31 DIAGNOSIS — R209 Unspecified disturbances of skin sensation: Secondary | ICD-10-CM | POA: Insufficient documentation

## 2021-05-31 DIAGNOSIS — R519 Headache, unspecified: Secondary | ICD-10-CM | POA: Insufficient documentation

## 2021-05-31 NOTE — ED Triage Notes (Signed)
Pt drove self. C/O rt lower dental pain, rt side of face pain numbing and burning, rt upper arm in armpit is sore to the touch, rt hand swelling and burning sensation, rt leg and rt buttock is having tingling and c/o nausea. Symptoms started yesterday and became worse today.

## 2021-05-31 NOTE — ED Provider Notes (Signed)
Emergency Medicine Provider Triage Evaluation Note  Henry Stanley , a 45 y.o. male  was evaluated in triage.  Pt complains of dental pain.  The patient reports right upper dental pain that began over the last 24 hours.  He reports that he noticed a knot to the right side of his face that is actually improved in size throughout the day.  He states that the pain radiates to his right ear.  He reports that the pain has been constant and worsening since onset.    He also adds that he noticed some aching pain in his right bicep earlier today as well as some pain to his right second and third knuckles that began earlier today.  No recent falls, injuries, fever, chills, numbness, weakness, sore throat.  He has a history of drug use, but denies IV drug use at this time.  Reports that he did snort some opioids earlier today.   Review of Systems  Positive: Dental pain, myalgias, arthralgias Negative: Rash, fever, chills, numbness, weakness, sore throat, trismus, drooling, voice change, chest pain, shortness of breath  Physical Exam  BP 127/82 (BP Location: Left Arm)   Pulse 89   Temp 98.4 F (36.9 C) (Oral)   Resp 15   Ht 5\' 6"  (1.676 m)   Wt 72.6 kg   SpO2 99%   BMI 25.82 kg/m  Gen:   Awake, no distress   Resp:  Normal effort  MSK:   Moves extremities without difficulty reproducible tenderness to the right biceps brachii.  No overlying erythema, warmth, red streaking, fluctuance, induration, rashes.  There is mild swelling noted to the second and third right MCP joints.  Full active and passive range of motion of all joints of the right hand and fingers.  Neurovascular intact.  Good grip strength.  No overlying skin changes. Other:  Right molar is fractured.  No drainage is able to be expressed.  There is mild swelling noted to the right cheek.  No sublingual edema.  No trismus, drooling.  Normal phonation.  Able to speak in complete, fluent sentences without increased work of breathing.  Medical  Decision Making  Medically screening exam initiated at 11:48 PM.  Appropriate orders placed.  Henry Stanley was informed that the remainder of the evaluation will be completed by another provider, this initial triage assessment does not replace that evaluation, and the importance of remaining in the ED until their evaluation is complete.  Patient will require further work-up and evaluation in the emergency department.   , PA-C 05/31/21 2348    06/02/21, MD 06/01/21 [id number]

## 2021-06-01 NOTE — ED Notes (Signed)
Call PT x3 no answer.

## 2021-06-01 NOTE — ED Notes (Addendum)
Pt name called for vitals; no response 

## 2021-06-08 ENCOUNTER — Ambulatory Visit: Payer: Self-pay | Admitting: Nurse Practitioner

## 2021-06-08 ENCOUNTER — Ambulatory Visit: Payer: Self-pay | Attending: Nurse Practitioner

## 2021-06-08 ENCOUNTER — Other Ambulatory Visit: Payer: Self-pay | Admitting: Nurse Practitioner

## 2021-06-08 ENCOUNTER — Other Ambulatory Visit: Payer: Self-pay

## 2021-06-08 DIAGNOSIS — E785 Hyperlipidemia, unspecified: Secondary | ICD-10-CM

## 2021-06-08 DIAGNOSIS — R7989 Other specified abnormal findings of blood chemistry: Secondary | ICD-10-CM

## 2021-06-08 DIAGNOSIS — N179 Acute kidney failure, unspecified: Secondary | ICD-10-CM

## 2021-06-08 DIAGNOSIS — Z1211 Encounter for screening for malignant neoplasm of colon: Secondary | ICD-10-CM

## 2021-06-09 LAB — CBC
Hematocrit: 42.1 % (ref 37.5–51.0)
Hemoglobin: 14.1 g/dL (ref 13.0–17.7)
MCH: 32.1 pg (ref 26.6–33.0)
MCHC: 33.5 g/dL (ref 31.5–35.7)
MCV: 96 fL (ref 79–97)
Platelets: 194 10*3/uL (ref 150–450)
RBC: 4.39 x10E6/uL (ref 4.14–5.80)
RDW: 11.3 % — ABNORMAL LOW (ref 11.6–15.4)
WBC: 5.4 10*3/uL (ref 3.4–10.8)

## 2021-06-09 LAB — CMP14+EGFR
ALT: 14 IU/L (ref 0–44)
AST: 15 IU/L (ref 0–40)
Albumin/Globulin Ratio: 2 (ref 1.2–2.2)
Albumin: 4.7 g/dL (ref 4.0–5.0)
Alkaline Phosphatase: 73 IU/L (ref 44–121)
BUN/Creatinine Ratio: 10 (ref 9–20)
BUN: 12 mg/dL (ref 6–24)
Bilirubin Total: 0.3 mg/dL (ref 0.0–1.2)
CO2: 23 mmol/L (ref 20–29)
Calcium: 9.7 mg/dL (ref 8.7–10.2)
Chloride: 104 mmol/L (ref 96–106)
Creatinine, Ser: 1.17 mg/dL (ref 0.76–1.27)
Globulin, Total: 2.3 g/dL (ref 1.5–4.5)
Glucose: 100 mg/dL — ABNORMAL HIGH (ref 70–99)
Potassium: 4.5 mmol/L (ref 3.5–5.2)
Sodium: 140 mmol/L (ref 134–144)
Total Protein: 7 g/dL (ref 6.0–8.5)
eGFR: 78 mL/min/{1.73_m2} (ref >59)

## 2021-06-09 LAB — LIPID PANEL
Chol/HDL Ratio: 4.5 ratio (ref 0.0–5.0)
Cholesterol, Total: 202 mg/dL — ABNORMAL HIGH (ref 100–199)
HDL: 45 mg/dL (ref >39)
LDL Chol Calc (NIH): 144 mg/dL — ABNORMAL HIGH (ref 0–99)
Triglycerides: 73 mg/dL (ref 0–149)
VLDL Cholesterol Cal: 13 mg/dL (ref 5–40)

## 2021-06-22 ENCOUNTER — Telehealth: Payer: Self-pay | Admitting: Nurse Practitioner

## 2021-06-22 NOTE — Telephone Encounter (Signed)
Copied from CRM [id number]. Topic: General - Inquiry >> Jun 14, 2021  2:53 PM Aretta Nip wrote: Reason for CRM: Pt has repeatedly called back for lab results, office does not pu after repeated tries and not released to NT to disclose, pt seems frustrated [phone] call after 1:00 pm >> Jun 21, 2021 10:39 AM Gwenlyn Fudge wrote: Pt calling in again regarding lab results. He states that he was on the line with someone and got disconnected. Pt is requesting to have a call back to discuss. Attempted to reach office with no response. Please advise.

## 2021-06-22 NOTE — Telephone Encounter (Signed)
See lab result notes

## 2021-06-22 NOTE — Telephone Encounter (Signed)
Called pt made aware of NP results note message and instructions. Verbalized understanding

## 2021-06-22 NOTE — Telephone Encounter (Signed)
Copied from CRM [id number]. Topic: General - Other >> Jun 13, 2021 11:21 AM Wyonia Hough E wrote: Reason for CRM:Pt would like a call to explain lab results/ please advise

## 2021-07-13 ENCOUNTER — Ambulatory Visit: Payer: Self-pay | Admitting: Nurse Practitioner

## 2021-08-19 ENCOUNTER — Emergency Department (HOSPITAL_COMMUNITY)
Admission: EM | Admit: 2021-08-19 | Discharge: 2021-08-19 | Disposition: A | Discharge status: Home / Self Care | Payer: Self-pay | Attending: Emergency Medicine | Admitting: Emergency Medicine

## 2021-08-19 ENCOUNTER — Encounter (HOSPITAL_COMMUNITY): Payer: Self-pay

## 2021-08-19 ENCOUNTER — Other Ambulatory Visit: Payer: Self-pay

## 2021-08-19 DIAGNOSIS — F1910 Other psychoactive substance abuse, uncomplicated: Secondary | ICD-10-CM | POA: Insufficient documentation

## 2021-08-19 DIAGNOSIS — K602 Anal fissure, unspecified: Secondary | ICD-10-CM | POA: Insufficient documentation

## 2021-08-19 DIAGNOSIS — K625 Hemorrhage of anus and rectum: Secondary | ICD-10-CM

## 2021-08-19 DIAGNOSIS — F17210 Nicotine dependence, cigarettes, uncomplicated: Secondary | ICD-10-CM | POA: Insufficient documentation

## 2021-08-19 LAB — CBC WITH DIFFERENTIAL/PLATELET
Abs Immature Granulocytes: 0 10*3/uL (ref 0.00–0.07)
Basophils Absolute: 0 10*3/uL (ref 0.0–0.1)
Basophils Relative: 1 %
Eosinophils Absolute: 0.3 10*3/uL (ref 0.0–0.5)
Eosinophils Relative: 4 %
HCT: 42.2 % (ref 39.0–52.0)
Hemoglobin: 14.3 g/dL (ref 13.0–17.0)
Immature Granulocytes: 0 %
Lymphocytes Relative: 37 %
Lymphs Abs: 2.7 10*3/uL (ref 0.7–4.0)
MCH: 32.8 pg (ref 26.0–34.0)
MCHC: 33.9 g/dL (ref 30.0–36.0)
MCV: 96.8 fL (ref 80.0–100.0)
Monocytes Absolute: 0.6 10*3/uL (ref 0.1–1.0)
Monocytes Relative: 8 %
Neutro Abs: 3.6 10*3/uL (ref 1.7–7.7)
Neutrophils Relative %: 50 %
Platelets: 185 10*3/uL (ref 150–400)
RBC: 4.36 MIL/uL (ref 4.22–5.81)
RDW: 11.7 % (ref 11.5–15.5)
WBC: 7.2 10*3/uL (ref 4.0–10.5)
nRBC: 0 % (ref 0.0–0.2)

## 2021-08-19 LAB — COMPREHENSIVE METABOLIC PANEL
ALT: 22 U/L (ref 0–44)
AST: 18 U/L (ref 15–41)
Albumin: 3.9 g/dL (ref 3.5–5.0)
Alkaline Phosphatase: 57 U/L (ref 38–126)
Anion gap: 4 — ABNORMAL LOW (ref 5–15)
BUN: 10 mg/dL (ref 6–20)
CO2: 28 mmol/L (ref 22–32)
Calcium: 9.6 mg/dL (ref 8.9–10.3)
Chloride: 106 mmol/L (ref 98–111)
Creatinine, Ser: 1.15 mg/dL (ref 0.61–1.24)
GFR, Estimated: >60 mL/min (ref >60)
Glucose, Bld: 98 mg/dL (ref 70–99)
Potassium: 4.7 mmol/L (ref 3.5–5.1)
Sodium: 138 mmol/L (ref 135–145)
Total Bilirubin: 0.5 mg/dL (ref 0.3–1.2)
Total Protein: 6.6 g/dL (ref 6.5–8.1)

## 2021-08-19 LAB — LIPASE, BLOOD: Lipase: 36 U/L (ref 11–51)

## 2021-08-19 LAB — POC OCCULT BLOOD, ED: Fecal Occult Bld: POSITIVE — AB

## 2021-08-19 LAB — PROTIME-INR
INR: 0.9 (ref 0.8–1.2)
Prothrombin Time: 12.6 seconds (ref 11.4–15.2)

## 2021-08-19 NOTE — ED Triage Notes (Signed)
Pt reports when he woke up this morning he had upper abdominal pain. Endorses slight nausea without vomiting. Pt states when he had a BM this morning he noticed dark red blood. During triage pt states he has been taking 4 -10 mg percocet pills daily for approximately one year and thinks the two might be related.

## 2021-08-19 NOTE — ED Notes (Signed)
Pt left prior to receiving d/c papers

## 2021-08-19 NOTE — ED Provider Notes (Signed)
Emergency Medicine Provider Triage Evaluation Note  Henry Stanley , a 45 y.o. male  was evaluated in triage.  Pt complains of blood in stool since today. Patient states he has been taking 40mg  of percocet per day for last year and experiencing constipation as result. Patient has had to strain to stool as of recent and today  noticed blood in the stool and on his toilet paper. Patient concerned about cancer.  Review of Systems  Positive: Blood in stool, rectal pain Negative: Fevers, N/V/D, abdominal pain  Physical Exam  BP 116/85 (BP Location: Left Arm)   Pulse 85   Temp 97.8 F (36.6 C) (Oral)   Resp 14   Ht 5\' 6"  (1.676 m)   Wt 72.6 kg   SpO2 99%   BMI 25.82 kg/m  Gen:   Awake, no distress   Resp:  Normal effort  MSK:   Moves extremities without difficulty  Other:    Medical Decision Making  Medically screening exam initiated at 11:49 AM.  Appropriate orders placed.  Silver D Guereca was informed that the remainder of the evaluation will be completed by another provider, this initial triage assessment does not replace that evaluation, and the importance of remaining in the ED until their evaluation is complete.     , PA-C 08/19/21 1150    Al Decant, MD 08/19/21 1651

## 2021-08-19 NOTE — ED Notes (Signed)
Pt states he is an addict and is requesting help in quitting

## 2021-08-19 NOTE — ED Provider Notes (Signed)
[facility] Provider Note  CSN: [id number] Arrival date & time: 08/19/21 1108    History Chief Complaint  Patient presents with   Hematochezia    Henry Stanley is a 45 y.o. male with history of anxiety and substance abuse. He reports he takes up to four 10mg  percocet (he states sometimes with APAP sometimes plain oxycodone) for tooth ache and has been doing so for about a year. He woke up this morning with mild cramping epigastric pain. He had a BM and noted blood on tissue and in commode. He is not sure if there was stool too, but he had another BM on arrival here and there was normal colored hard stool with bright red blood. He has been straining to have a BM.    Past Medical History:  Diagnosis Date   Anxiety    Chronic kidney disease    Cocaine abuse (HCC)    Depression    GERD (gastroesophageal reflux disease)    Polysubstance abuse (HCC)    Seizures (HCC)    Sinus infection     Past Surgical History:  Procedure Laterality Date   FEMUR SURGERY      Family History  Problem Relation Age of Onset   Hypertension Mother    Cancer Mother     Social History   Tobacco Use   Smoking status: Some Days    Packs/day: 0.50    Types: Cigarettes   Smokeless tobacco: Current  Substance Use Topics   Alcohol use: Not Currently    Comment: 2 times a week.    Drug use: Yes    Types: Cocaine, Marijuana, Benzodiazepines     Home Medications Prior to Admission medications   Medication Sig Start Date End Date Taking? Authorizing Provider  bismuth subsalicylate (PEPTO BISMOL) 262 MG/15ML suspension Take 30 mLs by mouth every 6 (six) hours as needed for indigestion or diarrhea or loose stools. Patient not taking: Reported on 06/23/2020    [provider]  naloxone [facility]) 0.4 MG/ML injection Use as directed for drug overdose Patient not taking: Reported on 06/23/2020 08/01/19   Fulp, 08/03/19, MD  omeprazole (PRILOSEC) 20 MG capsule Take 1  capsule (20 mg total) by mouth 2 (two) times daily. To decrease stomach acid Patient not taking: Reported on 06/23/2020 08/01/19   Fulp, Cammie, MD  ondansetron (ZOFRAN) 8 MG tablet Take 1 tablet (8 mg total) by mouth every 8 (eight) hours as needed for nausea or vomiting. Patient not taking: Reported on 08/01/2019 07/02/19   07/04/19, PA-C     Allergies    Sumatriptan, Acetaminophen, and Benadryl [diphenhydramine hcl]   Review of Systems   Review of Systems A comprehensive review of systems was completed and negative except as noted in HPI.    Physical Exam BP 111/82 (BP Location: Right Arm)   Pulse 78   Temp 98.1 F (36.7 C) (Temporal)   Resp 16   Ht 5\' 6"  (1.676 m)   Wt 72.6 kg   SpO2 99%   BMI 25.82 kg/m   Physical Exam Vitals and nursing note reviewed.  Constitutional:      Appearance: Normal appearance.  HENT:     Head: Normocephalic and atraumatic.     Nose: Nose normal.     Mouth/Throat:     Mouth: Mucous membranes are moist.  Eyes:     Extraocular Movements: Extraocular movements intact.     Conjunctiva/sclera: Conjunctivae normal.  Cardiovascular:     Rate  and Rhythm: Normal rate.  Pulmonary:     Effort: Pulmonary effort is normal.     Breath sounds: Normal breath sounds.  Abdominal:     General: Abdomen is flat.     Palpations: Abdomen is soft.     Tenderness: There is no abdominal tenderness.  Genitourinary:    Comments: Small non-thrombosed hemorrhoid with a superficial fissure, no active bleeding. No mass on digital exam, no stool in fault, small amount of bright red blood on fingertip.  Musculoskeletal:        General: No swelling. Normal range of motion.     Cervical back: Neck supple.  Skin:    General: Skin is warm and dry.  Neurological:     General: No focal deficit present.     Mental Status: He is alert.  Psychiatric:        Mood and Affect: Mood normal.     ED Results / Procedures / Treatments   Labs (all labs ordered  are listed, but only abnormal results are displayed) Labs Reviewed  COMPREHENSIVE METABOLIC PANEL - Abnormal; Notable for the following components:      Result Value   Anion gap 4 (*)    All other components within normal limits  POC OCCULT BLOOD, ED - Abnormal; Notable for the following components:   Fecal Occult Bld POSITIVE (*)    All other components within normal limits  CBC WITH DIFFERENTIAL/PLATELET  LIPASE, BLOOD  PROTIME-INR    EKG None  Radiology No results found.  Procedures Procedures  Medications Ordered in the ED Medications - No data to display   MDM Rules/Calculators/A&P MDM Patient with reported rectal bleeding. On exam, source is likely hemorrhoid/fissure. Given reported extensive recreational percocet use, will also check liver and APAP levels. Patient is worried about colon cancer, advised he will need GI follow up for screening colonoscopy if labs are not significant enough to warrant admission.  ED Course  I have reviewed the triage vital signs and the nursing notes.  Pertinent labs & imaging results that were available during my care of the patient were reviewed by me and considered in my medical decision making (see chart for details).  Clinical Course as of 08/19/21 1453  Fri Aug 19, 2021  1317 CBC with normal Hgb.  [CS]  1414 LFTs are normal [CS]  1449 APAP did not get run but given normal LFTs and no concern for acute overdose this level would not now be helpful. He is interested in getting help for his substance abuse, will give him an outpatient resource guide. Referral to GI for rectal bleeding. RTED for any other concerns. [CS]    Clinical Course User Index [CS] Pollyann Savoy, MD    Final Clinical Impression(s) / ED Diagnoses Final diagnoses:  Rectal bleeding  Anal fissure  Substance abuse [facility])    Rx / DC Orders ED Discharge Orders     None        Pollyann Savoy, MD 08/19/21 1453

## 2021-09-27 ENCOUNTER — Emergency Department (HOSPITAL_COMMUNITY)
Admission: EM | Admit: 2021-09-27 | Discharge: 2021-09-27 | Disposition: A | Discharge status: Home / Self Care | Payer: Self-pay | Attending: Emergency Medicine | Admitting: Emergency Medicine

## 2021-09-27 DIAGNOSIS — R0602 Shortness of breath: Secondary | ICD-10-CM | POA: Insufficient documentation

## 2021-09-27 DIAGNOSIS — Z5321 Procedure and treatment not carried out due to patient leaving prior to being seen by health care provider: Secondary | ICD-10-CM | POA: Insufficient documentation

## 2021-09-27 DIAGNOSIS — R070 Pain in throat: Secondary | ICD-10-CM | POA: Insufficient documentation

## 2021-09-27 DIAGNOSIS — H9209 Otalgia, unspecified ear: Secondary | ICD-10-CM | POA: Insufficient documentation

## 2021-09-27 NOTE — ED Notes (Signed)
X1 for triage wit no results

## 2021-09-27 NOTE — ED Notes (Signed)
X2 no response °

## 2022-01-23 ENCOUNTER — Other Ambulatory Visit (HOSPITAL_COMMUNITY)
Admission: EM | Admit: 2022-01-23 | Discharge: 2022-01-24 | Disposition: A | Discharge status: Home / Self Care | Payer: No Payment, Other | Attending: Urology | Admitting: Urology

## 2022-01-23 DIAGNOSIS — F1410 Cocaine abuse, uncomplicated: Secondary | ICD-10-CM | POA: Insufficient documentation

## 2022-01-23 DIAGNOSIS — F1290 Cannabis use, unspecified, uncomplicated: Secondary | ICD-10-CM | POA: Insufficient documentation

## 2022-01-23 DIAGNOSIS — F1110 Opioid abuse, uncomplicated: Secondary | ICD-10-CM | POA: Insufficient documentation

## 2022-01-23 DIAGNOSIS — Z20822 Contact with and (suspected) exposure to covid-19: Secondary | ICD-10-CM | POA: Diagnosis not present

## 2022-01-23 DIAGNOSIS — F1994 Other psychoactive substance use, unspecified with psychoactive substance-induced mood disorder: Secondary | ICD-10-CM | POA: Diagnosis not present

## 2022-01-23 LAB — CBC WITH DIFFERENTIAL/PLATELET
Abs Immature Granulocytes: 0.01 10*3/uL (ref 0.00–0.07)
Basophils Absolute: 0.1 10*3/uL (ref 0.0–0.1)
Basophils Relative: 1 %
Eosinophils Absolute: 0.3 10*3/uL (ref 0.0–0.5)
Eosinophils Relative: 5 %
HCT: 36.1 % — ABNORMAL LOW (ref 39.0–52.0)
Hemoglobin: 12.5 g/dL — ABNORMAL LOW (ref 13.0–17.0)
Immature Granulocytes: 0 %
Lymphocytes Relative: 43 %
Lymphs Abs: 2.6 10*3/uL (ref 0.7–4.0)
MCH: 32.6 pg (ref 26.0–34.0)
MCHC: 34.6 g/dL (ref 30.0–36.0)
MCV: 94.3 fL (ref 80.0–100.0)
Monocytes Absolute: 0.5 10*3/uL (ref 0.1–1.0)
Monocytes Relative: 8 %
Neutro Abs: 2.6 10*3/uL (ref 1.7–7.7)
Neutrophils Relative %: 43 %
Platelets: 178 10*3/uL (ref 150–400)
RBC: 3.83 MIL/uL — ABNORMAL LOW (ref 4.22–5.81)
RDW: 11.7 % (ref 11.5–15.5)
WBC: 6 10*3/uL (ref 4.0–10.5)
nRBC: 0 % (ref 0.0–0.2)

## 2022-01-23 LAB — POCT URINE DRUG SCREEN - MANUAL ENTRY (I-SCREEN)
POC Amphetamine UR: NOT DETECTED
POC Buprenorphine (BUP): NOT DETECTED
POC Cocaine UR: POSITIVE — AB
POC Marijuana UR: POSITIVE — AB
POC Methadone UR: NOT DETECTED
POC Methamphetamine UR: NOT DETECTED
POC Morphine: POSITIVE — AB
POC Oxazepam (BZO): POSITIVE — AB
POC Oxycodone UR: POSITIVE — AB
POC Secobarbital (BAR): NOT DETECTED

## 2022-01-23 LAB — COMPREHENSIVE METABOLIC PANEL
ALT: 12 U/L (ref 0–44)
AST: 14 U/L — ABNORMAL LOW (ref 15–41)
Albumin: 3.8 g/dL (ref 3.5–5.0)
Alkaline Phosphatase: 53 U/L (ref 38–126)
Anion gap: 6 (ref 5–15)
BUN: 13 mg/dL (ref 6–20)
CO2: 25 mmol/L (ref 22–32)
Calcium: 9.2 mg/dL (ref 8.9–10.3)
Chloride: 105 mmol/L (ref 98–111)
Creatinine, Ser: 1.19 mg/dL (ref 0.61–1.24)
GFR, Estimated: >60 mL/min (ref >60)
Glucose, Bld: 93 mg/dL (ref 70–99)
Potassium: 4 mmol/L (ref 3.5–5.1)
Sodium: 136 mmol/L (ref 135–145)
Total Bilirubin: 0.5 mg/dL (ref 0.3–1.2)
Total Protein: 6.1 g/dL — ABNORMAL LOW (ref 6.5–8.1)

## 2022-01-23 LAB — ETHANOL: Alcohol, Ethyl (B): <10 mg/dL (ref <10)

## 2022-01-23 LAB — LIPID PANEL
Cholesterol: 194 mg/dL (ref 0–200)
HDL: 40 mg/dL — ABNORMAL LOW (ref >40)
LDL Cholesterol: 132 mg/dL — ABNORMAL HIGH (ref 0–99)
Total CHOL/HDL Ratio: 4.9 RATIO
Triglycerides: 108 mg/dL (ref <150)
VLDL: 22 mg/dL (ref 0–40)

## 2022-01-23 LAB — RESP PANEL BY RT-PCR (FLU A&B, COVID) ARPGX2
Influenza A by PCR: NEGATIVE
Influenza B by PCR: NEGATIVE
SARS Coronavirus 2 by RT PCR: NEGATIVE

## 2022-01-23 LAB — TSH: TSH: 1.125 u[IU]/mL (ref 0.350–4.500)

## 2022-01-23 LAB — HEMOGLOBIN A1C
Hgb A1c MFr Bld: 5.2 % (ref 4.8–5.6)
Mean Plasma Glucose: 103 mg/dL

## 2022-01-23 LAB — POC SARS CORONAVIRUS 2 AG: SARSCOV2ONAVIRUS 2 AG: NEGATIVE

## 2022-01-23 MED ORDER — HYDROXYZINE HCL 25 MG PO TABS: 25.0000 mg | ORAL_TABLET | Freq: Four times a day (QID) | ORAL | Status: DC | PRN

## 2022-01-23 MED ORDER — DICYCLOMINE HCL 20 MG PO TABS
20.0000 mg | ORAL_TABLET | Freq: Four times a day (QID) | ORAL | Status: DC | PRN
Start: 2022-01-23 — End: 2022-01-24

## 2022-01-23 MED ORDER — ONDANSETRON 4 MG PO TBDP: 4.0000 mg | ORAL_TABLET | Freq: Four times a day (QID) | ORAL | Status: DC | PRN

## 2022-01-23 MED ORDER — NAPROXEN 500 MG PO TABS: 500.0000 mg | ORAL_TABLET | Freq: Two times a day (BID) | ORAL | Status: DC | PRN

## 2022-01-23 MED ORDER — MAGNESIUM HYDROXIDE 400 MG/5ML PO SUSP: 30.0000 mL | Freq: Every day | ORAL | Status: DC | PRN

## 2022-01-23 MED ORDER — TRAZODONE HCL 50 MG PO TABS
50.0000 mg | ORAL_TABLET | Freq: Every evening | ORAL | Status: DC | PRN
  Filled 2022-01-23: qty 1

## 2022-01-23 MED ORDER — METHOCARBAMOL 500 MG PO TABS
500.0000 mg | ORAL_TABLET | Freq: Three times a day (TID) | ORAL | Status: DC | PRN
Start: 2022-01-23 — End: 2022-01-24

## 2022-01-23 MED ORDER — CLONIDINE HCL 0.1 MG PO TABS: 0.1000 mg | ORAL_TABLET | ORAL | Status: DC | PRN

## 2022-01-23 MED ORDER — LOPERAMIDE HCL 2 MG PO CAPS: 2.0000 mg | ORAL_CAPSULE | ORAL | Status: DC | PRN

## 2022-01-23 MED ORDER — ALUM & MAG HYDROXIDE-SIMETH 200-200-20 MG/5ML PO SUSP: 30.0000 mL | ORAL | Status: DC | PRN

## 2022-01-23 NOTE — ED Notes (Signed)
Patient received clothing that was dropped off for him. ?

## 2022-01-23 NOTE — Medical Student Note (Cosign Needed)
Behavioral Health Progress Note  Date and Time: 01/23/2022 2:22 PM Name: Henry Stanley MRN:  [id number]  Subjective:  "Opioid Detox"  Patient is feeling "okay" currently. He was sent by [facility] for detox prior to placement at a rehab center for opioid use. He reports that he has been using opioids (primarily percocet and oxycodone) for approximately 2 years following treatment for dental pain. He has tried to quit on his own, but he has never had a formal detox and endorses being ready to be done with opioids. He has stopped multiple other substances such as xanax in the past, and he did have a seizure during his withdrawal period. He currently denies other substance use except for daily Marijuana use. He denies any synthetic THC product use. He denies any alcohol use.  He feels well supported and has a fiancee and four kids. 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 He has his GED. He was incarcerated when he was younger for armed robbery. He is currently in an unstable housing situation and staying in motels. He denies any history of sexual or physical abuse.  He has seen a psychiatrist in the past for depression and paranoid thoughts during incarceration. He is not sure of all of the medications tried at this time, but he was able to recall Wellbutrin. He has never had a SA or psychiatric hospitalization and has no reported history of violence.   Diagnosis:  Final diagnoses:  Substance induced mood disorder (HCC)  Opiate abuse, continuous (HCC)    Total Time spent with patient: 15 minutes  Past Psychiatric History:  Patient reports history of depression and paranoia  Past Medical History:  Past Medical History:  Diagnosis Date   Anxiety    Chronic kidney disease    Cocaine abuse (HCC)    Depression    GERD (gastroesophageal reflux disease)    Polysubstance abuse (HCC)    Seizures (HCC)    Sinus  infection     Past Surgical History:  Procedure Laterality Date   FEMUR SURGERY     Family History:  Family History  Problem Relation Age of Onset   Hypertension Mother    Cancer Mother    Family Psychiatric  History: None  Social History:  Social History   Substance and Sexual Activity  Alcohol Use Not Currently   Comment: 2 times a week.      Social History   Substance and Sexual Activity  Drug Use Yes   Types: Cocaine, Marijuana, Benzodiazepines    Social History   Socioeconomic History   Marital status: Significant Other    Spouse name: Not on file   Number of children: Not on file   Years of education: Not on file   Highest education level: Not on file  Occupational History   Not on file  Tobacco Use   Smoking status: Some Days    Packs/day: 0.50    Types: Cigarettes   Smokeless tobacco: Current  Substance and Sexual Activity   Alcohol use: Not Currently    Comment: 2 times a week.    Drug use: Yes    Types: Cocaine, Marijuana, Benzodiazepines   Sexual activity: Yes    Birth control/protection: Condom  Other Topics Concern   Not on file  Social History Narrative   ** Merged History Encounter **       Social Determinants of Health   Financial  Resource Strain: Not on file  Food Insecurity: Not on file  Transportation Needs: Not on file  Physical Activity: Not on file  Stress: Not on file  Social Connections: Not on file   SDOH:  SDOH Screenings   Alcohol Screen: Not on file  Depression (PHQ2-9): Medium Risk   PHQ-2 Score: 17  Financial Resource Strain: Not on file  Food Insecurity: Not on file  Housing: Not on file  Physical Activity: Not on file  Social Connections: Not on file  Stress: Not on file  Tobacco Use: High Risk   Smoking Tobacco Use: Some Days   Smokeless Tobacco Use: Current   Passive Exposure: Not on file  Transportation Needs: Not on file   Additional Social History:    Pain Medications: See MAR Prescriptions: See  MAR Over the Counter: See MAR History of alcohol / drug use?: Yes Longest period of sobriety (when/how long): Unknown Negative Consequences of Use: Legal Withdrawal Symptoms: Nausea / Vomiting, Diarrhea, Patient aware of relationship between substance abuse and physical/medical complications, Seizures Onset of Seizures: When detoxing from Xanax Date of most recent seizure: 5-6 years ago Name of Substance 1: Opioids (Oxycodone and Percocet) 1 - Age of First Use: 42 1 - Amount (size/oz): 80mg  1 - Frequency: Daily 1 - Duration: 2 years 1 - Last Use / Amount: 01/23/2022 at 0000 1 - Method of Aquiring: Unknown 1- Route of Use: Oral                  Sleep: Fair  Current Medications:  Current Facility-Administered Medications  Medication Dose Route Frequency Provider Last Rate Last Admin   alum & mag hydroxide-simeth (MAALOX/MYLANTA) 200-200-20 MG/5ML suspension 30 mL  30 mL Oral Q4H PRN Ajibola, Ene A, NP       dicyclomine (BENTYL) tablet 20 mg  20 mg Oral Q6H PRN Ajibola, Ene A, NP       hydrOXYzine (ATARAX) tablet 25 mg  25 mg Oral Q6H PRN Ajibola, Ene A, NP       loperamide (IMODIUM) capsule 2-4 mg  2-4 mg Oral PRN Ajibola, Ene A, NP       magnesium hydroxide (MILK OF MAGNESIA) suspension 30 mL  30 mL Oral Daily PRN Ajibola, Ene A, NP       methocarbamol (ROBAXIN) tablet 500 mg  500 mg Oral Q8H PRN Ajibola, Ene A, NP       naproxen (NAPROSYN) tablet 500 mg  500 mg Oral BID PRN Ajibola, Ene A, NP       ondansetron (ZOFRAN-ODT) disintegrating tablet 4 mg  4 mg Oral Q6H PRN Ajibola, Ene A, NP       traZODone (DESYREL) tablet 50 mg  50 mg Oral QHS PRN Ajibola, Ene A, NP       Current Outpatient Medications  Medication Sig Dispense Refill   ibuprofen (ADVIL) 800 MG tablet Take 800 mg by mouth every 8 (eight) hours as needed for headache.      Labs  Lab Results:  Admission on 01/23/2022  Component Date Value Ref Range Status   SARS Coronavirus 2 by RT PCR 01/23/2022 NEGATIVE   NEGATIVE Final   Comment: (NOTE) SARS-CoV-2 target nucleic acids are NOT DETECTED.  The SARS-CoV-2 RNA is generally detectable in upper respiratory specimens during the acute phase of infection. The lowest concentration of SARS-CoV-2 viral copies this assay can detect is 138 copies/mL. A negative result does not preclude SARS-Cov-2 infection and should not be used as the sole basis  for treatment or other patient management decisions. A negative result may occur with  improper specimen collection/handling, submission of specimen other than nasopharyngeal swab, presence of viral mutation(s) within the areas targeted by this assay, and inadequate number of viral copies(<138 copies/mL). A negative result must be combined with clinical observations, patient history, and epidemiological information. The expected result is Negative.  Fact Sheet for Patients:  BloggerCourse.com  Fact Sheet for Healthcare Providers:  SeriousBroker.it  This test is no                          t yet approved or cleared by the [place] FDA and  has been authorized for detection and/or diagnosis of SARS-CoV-2 by FDA under an Emergency Use Authorization (EUA). This EUA will remain  in effect (meaning this test can be used) for the duration of the COVID-19 declaration under Section 564(b)(1) of the Act, 21 U.S.C.section 360bbb-3(b)(1), unless the authorization is terminated  or revoked sooner.       Influenza A by PCR 01/23/2022 NEGATIVE  NEGATIVE Final   Influenza B by PCR 01/23/2022 NEGATIVE  NEGATIVE Final   Comment: (NOTE) The Xpert Xpress SARS-CoV-2/FLU/RSV plus assay is intended as an aid in the diagnosis of influenza from Nasopharyngeal swab specimens and should not be used as a sole basis for treatment. Nasal washings and aspirates are unacceptable for Xpert Xpress SARS-CoV-2/FLU/RSV testing.  Fact Sheet for  Patients: BloggerCourse.com  Fact Sheet for Healthcare Providers: SeriousBroker.it  This test is not yet approved or cleared by the [place] FDA and has been authorized for detection and/or diagnosis of SARS-CoV-2 by FDA under an Emergency Use Authorization (EUA). This EUA will remain in effect (meaning this test can be used) for the duration of the COVID-19 declaration under Section 564(b)(1) of the Act, 21 U.S.C. section 360bbb-3(b)(1), unless the authorization is terminated or revoked.  Performed at [facility], [address], [place], [place] [address]    WBC 01/23/2022 6.0  4.0 - 10.5 K/uL Final   RBC 01/23/2022 3.83 (L)  4.22 - 5.81 MIL/uL Final   Hemoglobin 01/23/2022 12.5 (L)  13.0 - 17.0 g/dL Final   HCT 19/14/7829 36.1 (L)  39.0 - 52.0 % Final   MCV 01/23/2022 94.3  80.0 - 100.0 fL Final   MCH 01/23/2022 32.6  26.0 - 34.0 pg Final   MCHC 01/23/2022 34.6  30.0 - 36.0 g/dL Final   RDW 56/21/3086 11.7  11.5 - 15.5 % Final   Platelets 01/23/2022 178  150 - 400 K/uL Final   nRBC 01/23/2022 0.0  0.0 - 0.2 % Final   Neutrophils Relative % 01/23/2022 43  % Final   Neutro Abs 01/23/2022 2.6  1.7 - 7.7 K/uL Final   Lymphocytes Relative 01/23/2022 43  % Final   Lymphs Abs 01/23/2022 2.6  0.7 - 4.0 K/uL Final   Monocytes Relative 01/23/2022 8  % Final   Monocytes Absolute 01/23/2022 0.5  0.1 - 1.0 K/uL Final   Eosinophils Relative 01/23/2022 5  % Final   Eosinophils Absolute 01/23/2022 0.3  0.0 - 0.5 K/uL Final   Basophils Relative 01/23/2022 1  % Final   Basophils Absolute 01/23/2022 0.1  0.0 - 0.1 K/uL Final   Immature Granulocytes 01/23/2022 0  % Final   Abs Immature Granulocytes 01/23/2022 0.01  0.00 - 0.07 K/uL Final   Performed at [facility], [address], [place], [place] [address]  Sodium 01/23/2022 136  135 - 145 mmol/L Final   Potassium 01/23/2022 4.0  3.5 - 5.1 mmol/L Final   Chloride  01/23/2022 105  98 - 111 mmol/L Final   CO2 01/23/2022 25  22 - 32 mmol/L Final   Glucose, Bld 01/23/2022 93  70 - 99 mg/dL Final   Glucose reference range applies only to samples taken after fasting for at least 8 hours.   BUN 01/23/2022 13  6 - 20 mg/dL Final   Creatinine, Ser 01/23/2022 1.19  0.61 - 1.24 mg/dL Final   Calcium 16/06/9603 9.2  8.9 - 10.3 mg/dL Final   Total Protein 54/05/8118 6.1 (L)  6.5 - 8.1 g/dL Final   Albumin 14/78/2956 3.8  3.5 - 5.0 g/dL Final   AST 21/30/8657 14 (L)  15 - 41 U/L Final   ALT 01/23/2022 12  0 - 44 U/L Final   Alkaline Phosphatase 01/23/2022 53  38 - 126 U/L Final   Total Bilirubin 01/23/2022 0.5  0.3 - 1.2 mg/dL Final   GFR, Estimated 01/23/2022 >60  >60 mL/min Final   Comment: (NOTE) Calculated using the CKD-EPI Creatinine Equation (2021)    Anion gap 01/23/2022 6  5 - 15 Final   Performed at [facility], [address], [place], [place] [address]   Alcohol, Ethyl (B) 01/23/2022 <10  <10 mg/dL Final   Comment: (NOTE) Lowest detectable limit for serum alcohol is 10 mg/dL.  For medical purposes only. Performed at [facility], [address], [place], [place] [address]    Cholesterol 01/23/2022 194  0 - 200 mg/dL Final   Triglycerides 41/32/4401 108  <150 mg/dL Final   HDL 02/72/5366 40 (L)  >40 mg/dL Final   Total CHOL/HDL Ratio 01/23/2022 4.9  RATIO Final   VLDL 01/23/2022 22  0 - 40 mg/dL Final   LDL Cholesterol 01/23/2022 132 (H)  0 - 99 mg/dL Final   Comment:        Total Cholesterol/HDL:CHD Risk Coronary Heart Disease Risk Table                     Men   Women  1/2 Average Risk   3.4   3.3  Average Risk       5.0   4.4  2 X Average Risk   9.6   7.1  3 X Average Risk  23.4   11.0        Use the calculated Patient Ratio above and the CHD Risk Table to determine the patient's CHD Risk.        ATP III CLASSIFICATION (LDL):  <100     mg/dL   Optimal  440-347  mg/dL   Near or Above                    Optimal   130-159  mg/dL   Borderline  425-956  mg/dL   High  >387     mg/dL   Very High Performed at [facility], [address], [place], [place] [address]    TSH 01/23/2022 1.125  0.350 - 4.500 uIU/mL Final   Comment: Performed by a 3rd Generation assay with a functional sensitivity of <=0.01 uIU/mL. Performed at [facility], [address], [place], [place] [address]    POC Amphetamine UR 01/23/2022 None Detected  NONE DETECTED (Cut Off Level 1000 ng/mL) Final   POC Secobarbital (BAR) 01/23/2022 None Detected  NONE  DETECTED (Cut Off Level 300 ng/mL) Final   POC Buprenorphine (BUP) 01/23/2022 None Detected  NONE DETECTED (Cut Off Level 10 ng/mL) Final   POC Oxazepam (BZO) 01/23/2022 Positive (A)  NONE DETECTED (Cut Off Level 300 ng/mL) Final   POC Cocaine UR 01/23/2022 Positive (A)  NONE DETECTED (Cut Off Level 300 ng/mL) Final   POC Methamphetamine UR 01/23/2022 None Detected  NONE DETECTED (Cut Off Level 1000 ng/mL) Final   POC Morphine 01/23/2022 Positive (A)  NONE DETECTED (Cut Off Level 300 ng/mL) Final   POC Oxycodone UR 01/23/2022 Positive (A)  NONE DETECTED (Cut Off Level 100 ng/mL) Final   POC Methadone UR 01/23/2022 None Detected  NONE DETECTED (Cut Off Level 300 ng/mL) Final   POC Marijuana UR 01/23/2022 Positive (A)  NONE DETECTED (Cut Off Level 50 ng/mL) Final   SARSCOV2ONAVIRUS 2 AG 01/23/2022 NEGATIVE  NEGATIVE Final   Comment: (NOTE) SARS-CoV-2 antigen NOT DETECTED.   Negative results are presumptive.  Negative results do not preclude SARS-CoV-2 infection and should not be used as the sole basis for treatment or other patient management decisions, including infection  control decisions, particularly in the presence of clinical signs and  symptoms consistent with COVID-19, or in those who have been in contact with the virus.  Negative results must be combined with clinical observations, patient history, and epidemiological information. The expected result is  Negative.  Fact Sheet for Patients: https://www.jennings-kim.com/  Fact Sheet for Healthcare Providers: https://alexander-rogers.biz/  This test is not yet approved or cleared by the [place] FDA and  has been authorized for detection and/or diagnosis of SARS-CoV-2 by FDA under an Emergency Use Authorization (EUA).  This EUA will remain in effect (meaning this test can be used) for the duration of  the COV                          ID-19 declaration under Section 564(b)(1) of the Act, 21 U.S.C. section 360bbb-3(b)(1), unless the authorization is terminated or revoked sooner.    Admission on 08/19/2021, Discharged on 08/19/2021  Component Date Value Ref Range Status   WBC 08/19/2021 7.2  4.0 - 10.5 K/uL Final   RBC 08/19/2021 4.36  4.22 - 5.81 MIL/uL Final   Hemoglobin 08/19/2021 14.3  13.0 - 17.0 g/dL Final   HCT 16/06/9603 42.2  39.0 - 52.0 % Final   MCV 08/19/2021 96.8  80.0 - 100.0 fL Final   MCH 08/19/2021 32.8  26.0 - 34.0 pg Final   MCHC 08/19/2021 33.9  30.0 - 36.0 g/dL Final   RDW 54/05/8118 11.7  11.5 - 15.5 % Final   Platelets 08/19/2021 185  150 - 400 K/uL Final   nRBC 08/19/2021 0.0  0.0 - 0.2 % Final   Neutrophils Relative % 08/19/2021 50  % Final   Neutro Abs 08/19/2021 3.6  1.7 - 7.7 K/uL Final   Lymphocytes Relative 08/19/2021 37  % Final   Lymphs Abs 08/19/2021 2.7  0.7 - 4.0 K/uL Final   Monocytes Relative 08/19/2021 8  % Final   Monocytes Absolute 08/19/2021 0.6  0.1 - 1.0 K/uL Final   Eosinophils Relative 08/19/2021 4  % Final   Eosinophils Absolute 08/19/2021 0.3  0.0 - 0.5 K/uL Final   Basophils Relative 08/19/2021 1  % Final   Basophils Absolute 08/19/2021 0.0  0.0 - 0.1 K/uL Final   Immature Granulocytes 08/19/2021 0  % Final   Abs Immature Granulocytes 08/19/2021 0.00  0.00 -  0.07 K/uL Final   Performed at [facility], [address] [place], [place] [address]   Lipase 08/19/2021 36  11 - 51 U/L Final   Performed  at [facility], [address] [place], [place] [address]   Sodium 08/19/2021 138  135 - 145 mmol/L Final   Potassium 08/19/2021 4.7  3.5 - 5.1 mmol/L Final   Chloride 08/19/2021 106  98 - 111 mmol/L Final   CO2 08/19/2021 28  22 - 32 mmol/L Final   Glucose, Bld 08/19/2021 98  70 - 99 mg/dL Final   Glucose reference range applies only to samples taken after fasting for at least 8 hours.   BUN 08/19/2021 10  6 - 20 mg/dL Final   Creatinine, Ser 08/19/2021 1.15  0.61 - 1.24 mg/dL Final   Calcium 86/57/8469 9.6  8.9 - 10.3 mg/dL Final   Total Protein 62/95/2841 6.6  6.5 - 8.1 g/dL Final   Albumin 32/44/0102 3.9  3.5 - 5.0 g/dL Final   AST 72/53/6644 18  15 - 41 U/L Final   ALT 08/19/2021 22  0 - 44 U/L Final   Alkaline Phosphatase 08/19/2021 57  38 - 126 U/L Final   Total Bilirubin 08/19/2021 0.5  0.3 - 1.2 mg/dL Final   GFR, Estimated 08/19/2021 >60  >60 mL/min Final   Comment: (NOTE) Calculated using the CKD-EPI Creatinine Equation (2021)    Anion gap 08/19/2021 4 (L)  5 - 15 Final   Performed at [facility], [address] [place], [place] [address]   Prothrombin Time 08/19/2021 12.6  11.4 - 15.2 seconds Final   INR 08/19/2021 0.9  0.8 - 1.2 Final   Comment: (NOTE) INR goal varies based on device and disease states. Performed at [facility], [address], [place], [place] [address]    Fecal Occult Bld 08/19/2021 POSITIVE (A)  NEGATIVE Final    Blood Alcohol level:  Lab Results  Component Value Date   ETH <10 01/23/2022   ETH <10 06/26/2019    Metabolic Disorder Labs: Lab Results  Component Value Date   HGBA1C 5.1 06/27/2019   MPG 99.67 06/27/2019   No results found for: PROLACTIN Lab Results  Component Value Date   CHOL 194 01/23/2022   TRIG 108 01/23/2022   HDL 40 (L) 01/23/2022   CHOLHDL 4.9 01/23/2022   VLDL 22 01/23/2022   LDLCALC 132 (H) 01/23/2022   LDLCALC 144 (H) 06/08/2021    Therapeutic Lab Levels: No results found for:  LITHIUM No results found for: VALPROATE No components found for:  CBMZ  Physical Findings   GAD-7    Flowsheet Row Office Visit from 06/23/2020 in [facility] from 07/23/2019 in [facility]  Total GAD-7 Score 16 18      PHQ2-9    Flowsheet Row ED from 01/23/2022 in [facility] Office Visit from 06/23/2020 in [facility] from 07/23/2019 in [facility] Office Visit from 07/02/2019 in [facility]  PHQ-2 Total Score 4 6 4 1   PHQ-9 Total Score 17 12 10 8       Flowsheet Row ED from 01/23/2022 in [facility] ED from 08/19/2021 in [facility] EMERGENCY DEPARTMENT ED from 05/31/2021 in [facility] EMERGENCY DEPARTMENT  C-SSRS RISK CATEGORY No  Risk No Risk No Risk        Musculoskeletal  Strength & Muscle Tone: within normal limits Not assessed (Assessed in AM) Gait & Station: normal Not assessed (Assessed in AM) Patient leans: N/A  Psychiatric Specialty Exam     Presentation  General Appearance: Appropriate for Environment; Casual Eye Contact:Good Speech:Clear and Coherent; Normal Rate Speech Volume:Normal Handedness:No data recorded  Mood and Affect  Mood:-- ("ok") Affect:Flat; Appropriate  Thought Process  Thought Processes:Coherent Descriptions of Associations:Intact  Orientation:Full (Time, Place and Person)  Thought Content:Logical; WDL   Diagnosis of Schizophrenia or Schizoaffective disorder in past: No   Hallucinations:Hallucinations: None  Ideas of Reference:None  Suicidal Thoughts:Suicidal Thoughts: No  Homicidal Thoughts:Homicidal Thoughts: No   Sensorium  Memory:Immediate Good; Recent Good; Remote Good Judgment:Good Insight:Good  Executive Functions   Concentration:Good Attention Span:Good Recall:Good Fund of Knowledge:Good Language:Good  Psychomotor Activity  Psychomotor Activity:Psychomotor Activity: Normal  Assets  Assets:Desire for Improvement; Research scientist (medical); Resilience; Physical Health; Communication Skills; Vocational/Educational  Sleep  Sleep:Sleep: Fair  No data recorded  Physical Exam  Physical Exam Constitutional:      Appearance: Normal appearance. He is normal weight.  Cardiovascular:     Rate and Rhythm: Normal rate and regular rhythm.  Pulmonary:     Effort: Pulmonary effort is normal.     Breath sounds: Normal breath sounds.  Neurological:     Mental Status: He is alert and oriented to person, place, and time.  Psychiatric:        Behavior: Behavior normal.   Review of Systems  Constitutional:  Negative for chills, diaphoresis and fever.  Gastrointestinal:  Negative for abdominal pain, constipation and diarrhea.  Musculoskeletal:  Negative for myalgias.  Neurological:  Negative for dizziness, speech change, seizures and headaches.  Psychiatric/Behavioral:  Positive for substance abuse. Negative for depression, hallucinations, memory loss and suicidal ideas. The patient is not nervous/anxious and does not have insomnia.   Blood pressure 108/79, pulse 83, temperature 98.3 F (36.8 C), temperature source Oral, resp. rate 18, SpO2 100 %. There is no height or weight on file to calculate BMI.  Treatment Plan Summary: Daily contact with patient to assess and evaluate symptoms and progress in treatment and Plan to discharge to rehab facility post d/t. PRN meds available to aid in D/T as he endorses prior withdrawal symptoms.  Louanne Skye, Medical Student 01/23/2022 2:22 PM

## 2022-01-23 NOTE — ED Notes (Signed)
46 yo male transfer from observation unit d/t endorsing opiate abuse. Pt denies SI/HI/AVH. Request residential treatment program after discharge. Pt calm, cooperative throughout interview process. Skin assessment completed. Oriented to unit. Meal and drink offered. Pt verbally contract for safety. Will monitor for safety.  ?

## 2022-01-23 NOTE — ED Notes (Signed)
Received patient this PM. Patient is sleeping in his bed. Patient respirations are even and unlabored. Will continue to monitor for safety.  

## 2022-01-23 NOTE — BH Assessment (Signed)
Comprehensive Clinical Assessment (CCA) Note ? ?01/23/2022 ?[name] ?[id number] ? ?Discharge Disposition: ?Henry Reasoner, NP, reviewed pt's chart and information and met with pt face-to-face and determined pt meets FBC criteria. Pt has been accepted to [facility]. ? ?The patient demonstrates the following risk factors for suicide: Chronic risk factors for suicide include: psychiatric disorder of Substance-Induced Mood Disorder and substance use disorder. Acute risk factors for suicide include: unemployment and social withdrawal/isolation. Protective factors for this patient include: positive social support, responsibility to others (children, family), and hope for the future. Considering these factors, the overall suicide risk at this point appears to be none. Patient is not appropriate for outpatient follow up. ? ?Therefore, no sitter is recommended for suicide precautions. ? ?[place] ED from 01/23/2022 in [facility] ED from 08/19/2021 in [place] [facility] from 05/31/2021 in [place]  ?C-SSRS RISK CATEGORY No Risk No Risk No Risk  ? ?  ?Chief Complaint:  ?Chief Complaint  ?Patient presents with  ? Addiction Problem  ? ?Visit Diagnosis: Substance-Induced Mood Disorder  ? ?CCA Screening, Triage and Referral (STR) ?Henry Stanley is a 46 year old [facility]. Pt states, "I've had an opioid addiction for 2 years." Pt shares he has been abusing a combination of $RemoveBefore'80mg'LNriLFWTzFxpx$  of Oxycodone and Percocets daily; he last used $RemoveB'10mg'yoiqvzzm$  of Oxycodone around 0000. Pt shares he takes the medication by swallowing it; he denies snorting or injecting.  ? ?Pt denies SI or a hx of SI. He denies any prior attempts to kill himself or a plan to kill himself. He denies HI, AVH, NSSIB, or access to guns/weapons. Pt shares he is currently on probation for drug and he was sent for the assessment today by [facility]; he  states he is to detox here at [facility] and then [facility] will send him to a longer-term SA program. Pt denies the use of any other substances. ? ?Pt is oriented x5. His recent/remote memory is intact. Pt was cooperative throughout the assessment process. Pt's insight, judgement, and impulse control is impaired at this time. ? ?Patient Reported Information ?How did you hear about [place]? Other (Comment) ([facility]) ? ?What Is the Reason for Your Visit/Call Today? Pt states, "I've had an opioid addiction for 2 years." Pt shares he has been abusing a combination of $RemoveBefore'80mg'oTUAQZawFyOvX$  of Oxycodone and Percocets daily; he last used $RemoveB'10mg'SQXknOrA$  of Oxycodone around 000. Pt shares he takes the medication by swallowing it; he denies snorting or injecting. Pt denies SI or a hx of SI. He denies HI, AVH, NSSIB, or access to guns/weapons. Pt shares he is currently on probation for drug and he was sent for the assessment today by [facility]; he states he is to detox here at [facility] and then [facility] will send him to a longer-term SA program. Pt denies the use of any other substances. ? ?How Long Has This Been Causing You Problems? > than 6 months ? ?What Do You Feel Would Help You the Most Today? Alcohol or Drug Use Treatment ? ? ?Have You Recently Had Any Thoughts About Hurting Yourself? No ? ?Are You Planning to Commit Suicide/Harm Yourself At This time? No ? ? ?Have you Recently Had Thoughts About [place]? No ? ?Are You Planning to Harm Someone at This Time? No ? ?Explanation: No data recorded ? ?Have You Used Any Alcohol or Drugs in the Past 24 Hours? Yes ? ?How  Long Ago Did You Use Drugs or Alcohol? No data recorded ?What Did You Use and How Much? Pt states he swallowed $RemoveBefo'10mg'bhzxgNhCuCJ$  of Oxycodone ? ? ?Do You Currently Have a Therapist/Psychiatrist? No ? ?Name of Therapist/Psychiatrist: No data recorded ? ?Have You Been Recently Discharged From Any Office Practice or Programs? No ? ?Explanation of Discharge From Practice/Program: No data recorded ? ?  ?CCA Screening Triage  Referral Assessment ?Type of Contact: Face-to-Face ? ?Telemedicine Service Delivery:   ?Is this Initial or Reassessment? No data recorded ?Date Telepsych consult ordered in [facility]:  No data recorded ?Time Telepsych consult ordered in [facility]:  No data recorded ?Location of Assessment: [facility] Services ? ?Provider Location: [facility] Assessment Services ? ? ?Collateral Involvement: Pt provided verbal consent for contact to be made with his fiance, Jovita Kussmaul, at [phone], though he requests contact be made later in the day (after she's awake). He also provided verbal consent for information to be shared with his probation officer, Mr. Wynetta Emery of [facility], as well as with [facility]. ? ? ?Does Patient Have a Stage manager Guardian? No data recorded ?Name and Contact of Legal Guardian: No data recorded ?If Minor and Not Living with Parent(s), Who has Custody? N/A ? ?Is CPS involved or ever been involved? Never ? ?Is APS involved or ever been involved? Never ? ? ?Patient Determined To Be At Risk for Harm To Self or Others Based on Review of Patient Reported Information or Presenting Complaint? No ? ?Method: No data recorded ?Availability of Means: No data recorded ?Intent: No data recorded ?Notification Required: No data recorded ?Additional Information for Danger to Others Potential: No data recorded ?Additional Comments for Danger to Others Potential: No data recorded ?Are There Guns or Other Weapons in [place]? No data recorded ?Types of Guns/Weapons: No data recorded ?Are These Weapons Safely Secured?                            No data recorded ?Who Could Verify You Are Able To Have These Secured: No data recorded ?Do You Have any Outstanding Charges, Pending Court Dates, Parole/Probation? No data recorded ?Contacted To Inform of Risk of Harm To Self or Others: -- (N/A) ? ? ? ?Does Patient Present under Involuntary Commitment? No ? ?IVC Papers Initial File Date: No data recorded ? ?[place] of  Residence: [place] ? ? ?Patient Currently Receiving the Following Services: Not Receiving Services ? ? ?Determination of Need: Urgent (48 hours) ? ? ?Options For Referral: Medication Management; Outpatient Therapy; Facility-Based Crisis ? ? ? ? ?CCA Biopsychosocial ?Patient Reported Schizophrenia/Schizoaffective Diagnosis in Past: No ? ? ?Strengths: Pt is actively seeking assistance for his substance abuse. He is able to identify his thoughts, feelings, and concerns. ? ? ?Mental Health Symptoms ?Depression:   ?Sleep (too much or little); Increase/decrease in appetite; Change in energy/activity ?  ?Duration of Depressive symptoms:  ?Duration of Depressive Symptoms: Greater than two weeks ?  ?Mania:   ?None ?  ?Anxiety:    ?Worrying; Tension ?  ?Psychosis:   ?None ?  ?Duration of Psychotic symptoms:    ?Trauma:   ?None ?  ?Obsessions:   ?None ?  ?Compulsions:   ?None ?  ?Inattention:   ?None ?  ?Hyperactivity/Impulsivity:   ?None ?  ?Oppositional/Defiant Behaviors:   ?None ?  ?Emotional Irregularity:   ?Potentially harmful impulsivity ?  ?Other Mood/Personality Symptoms:   ?None noted ?  ? ?Mental Status  Exam ?Appearance and self-care  ?Stature:   ?Average ?  ?Weight:   ?Average weight ?  ?Clothing:   ?Casual ?  ?Grooming:   ?Normal ?  ?Cosmetic use:   ?None ?  ?Posture/gait:   ?Normal ?  ?Motor activity:   ?Not Remarkable ?  ?Sensorium  ?Attention:   ?Normal ?  ?Concentration:   ?Normal ?  ?Orientation:   ?X5 ?  ?Recall/memory:   ?Normal ?  ?Affect and Mood  ?Affect:   ?Flat ?  ?Mood:   ?Anxious ?  ?Relating  ?Eye contact:   ?Normal ?  ?Facial expression:   ?Anxious ?  ?Attitude toward examiner:   ?Cooperative ?  ?Thought and Language  ?Speech flow:  ?Clear and Coherent ?  ?Thought content:   ?Appropriate to Mood and Circumstances ?  ?Preoccupation:   ?None ?  ?Hallucinations:   ?None ?  ?Organization:  No data recorded  ?Executive Functions  ?Fund of Knowledge:   ?Average ?  ?Intelligence:   ?Average ?   ?Abstraction:   ?Normal ?  ?Judgement:   ?Impaired ?  ?Reality Testing:   ?Realistic ?  ?Insight:   ?Gaps ?  ?Decision Making:   ?Impulsive ?  ?Social Functioning  ?Social Maturity:   ?Impulsive ?  ?Social Judgement:   ?N

## 2022-01-23 NOTE — ED Provider Notes (Signed)
Facility Based Crisis Admission H&P ? ?Date: 01/23/22 ?Patient Name: Henry Stanley ?MRN: [id number] ?Chief Complaint:  ?Chief Complaint  ?Patient presents with  ? Addiction Problem  ?   ? ?Diagnoses:  ?Final diagnoses:  ?Substance induced mood disorder ([place])  ?Opiate abuse, continuous ([place])  ? ? ?HPI: Henry Stanley 46 year old male with psychiatric history Polysubstance abuse (cocaine, Xanax, heroin, opiate) and unintentional overdose in 2020.  Patient presented to [facility] requesting detox from opiates and assistance with substance abuse treatment.   ? ?Patient reported that he was referred here by [facility].  ? ?This nurse petitioner met with patient face-to-face and reviewed his chart.  On assessment, patient is alert and oriented x4; patient is calm and cooperative.  he is speaking in a normal tone of voice at moderate rate with good eye contact.  Patient reports his mood as low and depressed; his affect is congruent with his stated mood.  Patient's thought process is coherent.  No evidence of distractibility or preoccupation noted during assessment. ? ?Patient reported he has a history of polysubstance abuse.  He shares he previously abused Xanax, cocaine, and heroine. He says he stopped abusing Xanax 5 years ago, he stopped using cocaine 1.5 years ago, he says he only used heroine once in 2020 when he overdosed. He reports that he is currently struggling with Opiate (Oxycodone and Percocet) abuse. He reports he uses a total of 80 mg of oxycodone and percocet per day. He says he is depressed due to substance abuse. He says he was to seek treatment because he is concern about his health. He says that substance abuse has cause him the had kidney disease and that he is worried about further damage to his organs. He is endorsing depressive symptoms of hopelessness, restlessness, isolation, crying spells, poor motivation, poor sleep, loss of pleasure in normally pleasurable activity.  ?He denies suicidal ideation,  homicidal ideation, paranoia, auditory/visual hallucination.  He denies all other substance abuse except for those stated above. Patient reports that he is currently homeless in [place], [place].  ? ? ? ?[place] 2-9:  ?[employer] Visit from 06/23/2020 in [facility] from 07/23/2019 in [place] Office Visit from 07/02/2019 in [place]  ?Thoughts that you would be better off dead, or of hurting yourself in some way Not at all Not at all Not at all  ?PHQ-9 Total Score _0 ? ?  ?  ?[place] ED from 08/19/2021 in [place] ED from 05/31/2021 in [place]  ?C-SSRS RISK CATEGORY No Risk No Risk  ? ?  ?  ? ?Total Time spent with patient: 20 minutes ? ?Musculoskeletal  ?Strength & Muscle Tone: within normal limits ?Gait & Station: normal ?Patient leans: Right ? ?Psychiatric Specialty Exam  ?Presentation ?General Appearance: No data recorded ?Eye Contact:No data recorded ?Speech:No data recorded ?Speech Volume:No data recorded ?Handedness:No data recorded ? ?Mood and Affect  ?Mood:No data recorded ?Affect:No data recorded ? ?Thought Process  ?Thought Processes:No data recorded ?Descriptions of Associations:No data recorded ?Orientation:No data recorded ?Thought Content:No data recorded   ?Hallucinations:No data recorded ?Ideas of Reference:No data recorded ?Suicidal Thoughts:No data recorded ?Homicidal Thoughts:No data recorded ? ?Sensorium  ?Memory:No data recorded ?Judgment:No data recorded ?Insight:No data recorded ? ?Executive Functions  ?Concentration:No data recorded ?Attention Span:No data recorded ?Recall:No data recorded ?Fund of [place] recorded ?OIBBCWUG:QB  data recorded ? ?Psychomotor Activity  ?Psychomotor Activity:No data recorded ? ?Assets  ?Assets:No data recorded ? ?Sleep  ?Sleep:No  data recorded ? ?No data recorded ? ?Physical Exam ?Vitals and nursing note reviewed.  ?Constitutional:   ?   General: He is not in acute distress. ?   Appearance: Normal appearance. He is well-developed. He is not ill-appearing or diaphoretic.  ?HENT:  ?   Head: Normocephalic and atraumatic.  ?Eyes:  ?   Conjunctiva/sclera: Conjunctivae normal.  ?Cardiovascular:  ?   Rate and Rhythm: Normal rate.  ?   Heart sounds: No murmur heard. ?Pulmonary:  ?   Effort: Pulmonary effort is normal. No respiratory distress.  ?   Breath sounds: Normal breath sounds.  ?Abdominal:  ?   Palpations: Abdomen is soft.  ?   Tenderness: There is no abdominal tenderness.  ?Musculoskeletal:     ?   General: No swelling.  ?   Cervical back: Neck supple.  ?Skin: ?   General: Skin is warm and dry.  ?   Capillary Refill: Capillary refill takes less than 2 seconds.  ?Neurological:  ?   Mental Status: He is alert and oriented to person, place, and time.  ?Psychiatric:     ?   Attention and Perception: Attention and perception normal.     ?   Mood and Affect: Mood is depressed.     ?   Speech: Speech normal.     ?   Behavior: Behavior normal. Behavior is cooperative.     ?   Thought Content: Thought content normal.     ?   Cognition and Memory: Cognition normal.  ? ?Review of Systems  ?Constitutional: Negative.   ?HENT: Negative.    ?Eyes: Negative.   ?Respiratory: Negative.    ?Cardiovascular: Negative.   ?Gastrointestinal: Negative.   ?Genitourinary: Negative.   ?Musculoskeletal: Negative.   ?Skin: Negative.   ?Neurological: Negative.   ?Endo/Heme/Allergies: Negative.   ?Psychiatric/Behavioral:  Positive for depression, hallucinations and substance abuse.   ? ?Blood pressure 108/79, pulse 83, temperature 98.3 ?F (36.8 ?C), temperature source Oral, resp. rate 18, SpO2 100 %. There is no height or weight on file to calculate BMI. ? ?Past Psychiatric History: Polysubstance abuse, unintentional overdose ? ?Is the patient at risk to self? No  ?Has the  patient been a risk to self in the past 6 months? No .    ?Has the patient been a risk to self within the distant past? No   ?Is the patient a risk to others? No   ?Has the patient been a risk to others in the past 6 months? No   ?Has the patient been a risk to others within the distant past? No  ? ?Past Medical History:  ?Past Medical History:  ?Diagnosis Date  ? Anxiety   ? Chronic kidney disease   ? Cocaine abuse ([place])   ? Depression   ? GERD (gastroesophageal reflux disease)   ? Polysubstance abuse ([place])   ? Seizures ([place])   ? Sinus infection   ?  ?Past Surgical History:  ?Procedure Laterality Date  ? FEMUR SURGERY    ? ? ?Family History:  ?Family History  ?Problem Relation Age of Onset  ? Hypertension Mother   ? Cancer Mother   ? ? ?Social History:  ?Social History  ? ?Socioeconomic History  ? Marital status: Significant Other  ?  Spouse name: Not on file  ? Number of children: Not on file  ? Years  of education: Not on file  ? Highest education level: Not on file  ?Occupational History  ? Not on file  ?Tobacco Use  ? Smoking status: Some Days  ?  Packs/day: 0.50  ?  Types: Cigarettes  ? Smokeless tobacco: Current  ?Substance and Sexual Activity  ? Alcohol use: Not Currently  ?  Comment: 2 times a week.   ? Drug use: Yes  ?  Types: Cocaine, Marijuana, Benzodiazepines  ? Sexual activity: Yes  ?  Birth control/protection: Condom  ?Other Topics Concern  ? Not on file  ?Social History Narrative  ? ** Merged History Encounter **  ?    ? ?Social Determinants of Health  ? ?Financial Resource Strain: Not on file  ?Food Insecurity: Not on file  ?Transportation Needs: Not on file  ?Physical Activity: Not on file  ?Stress: Not on file  ?Social Connections: Not on file  ?Intimate Partner Violence: Not on file  ? ? ?SDOH:  ?SDOH Screenings  ? ?Alcohol Screen: Not on file  ?Depression (PHQ2-9): Not on file  ?Financial Resource Strain: Not on file  ?Food Insecurity: Not on file  ?Housing: Not on file  ?Physical Activity: Not  on file  ?Social Connections: Not on file  ?Stress: Not on file  ?Tobacco Use: High Risk  ? Smoking Tobacco Use: Some Days  ? Smokeless Tobacco Use: Current  ? Passive Exposure: Not on file  ?Transportation [employer]

## 2022-01-23 NOTE — ED Notes (Signed)
Pt in room resting with eyes closed. RR even and unlabored. No acute distress noted. Will continue to monitor for safety. ?

## 2022-01-23 NOTE — ED Notes (Signed)
Patient remains asleep in bed without issue or concern.  Patient without distress or complaint. No evidence of withdrawal.  Cows = 1 due to elevated pulse.  Will monitor and provide safe supportive environment.   ?

## 2022-01-23 NOTE — ED Notes (Signed)
Pt. Slept through group. ?

## 2022-01-24 DIAGNOSIS — F1110 Opioid abuse, uncomplicated: Secondary | ICD-10-CM | POA: Diagnosis not present

## 2022-01-24 DIAGNOSIS — F1994 Other psychoactive substance use, unspecified with psychoactive substance-induced mood disorder: Secondary | ICD-10-CM | POA: Diagnosis not present

## 2022-01-24 DIAGNOSIS — F1410 Cocaine abuse, uncomplicated: Secondary | ICD-10-CM | POA: Diagnosis not present

## 2022-01-24 DIAGNOSIS — Z20822 Contact with and (suspected) exposure to covid-19: Secondary | ICD-10-CM | POA: Diagnosis not present

## 2022-01-24 LAB — PROLACTIN: Prolactin: 3.7 ng/mL — ABNORMAL LOW (ref 4.0–15.2)

## 2022-01-24 NOTE — ED Notes (Signed)
Pt was notified that breakfast is ready 

## 2022-01-24 NOTE — Discharge Instructions (Signed)
Take all medications as prescribed by his/her mental healthcare provider. ?Report any adverse effects and or reactions from the medicines to your outpatient provider promptly. ?Do not engage in alcohol and or illegal drug use while on prescription medicines. ?In the event of worsening symptoms, call the crisis hotline, 911 and or go to the nearest ED for appropriate evaluation and treatment of symptoms. ?follow-up with your primary care provider for your other medical issues, concerns and or health care needs. ? ? ?Please come to [facility] (this facility) during walk in hours for appointment with psychiatrist/provider for further medication management and for therapists for therapy.  ? ? Walk-Ins for medication management  are available on Monday, Wednesday, Thursday and Friday from 8am-11am.  It is first come, first -serve; it is best to arrive by 7:00 AM.  ? ? Walk-Ins for therapy are  available on Monday and Wednesday?s  8am-11am.  It is first come, first -serve; it is best to arrive by 7:00 AM.  ? ? ?When you arrive please go upstairs for your appointment. If you are unsure of where to go, inform the front desk that you are here for a walk in appointment and they will assist you with directions upstairs. ? ?Address:  ?[address], in [place], [address] ?Ph: [phone]  ? ? ? ? ? ?

## 2022-01-24 NOTE — ED Provider Notes (Signed)
FBC/OBS ASAP Discharge Summary ? ?Date and Time: 01/24/2022 9:56 AM  ?Name: Henry Stanley  ?MRN:  [id number]  ? ?Discharge Diagnoses:  ?Final diagnoses:  ?Substance induced mood disorder ([place])  ?Opiate abuse, continuous ([place])  ? ? ?Subjective: "I want to go". ? ?Patient seen and chart reviewed-most recent COWS 1.  He has received 0 PRNS for for opiate withdrawal.  On interview this morning, patient immediately reports "I want to go".  He denies SI/HI/AVH.  Patient states that he would like to discharge so that he can return to work in order to provide for his family.  Patient states that he thought his withdrawal symptoms were going to be worse than they are.  He denies nausea, vomiting, diarrhea, body aches.  No issues with appetite or sleep.  Patient requests resources for outpatient treatment.  Patient states "my family really needs me.  I need to work".  Patient states that he does have "a problem with pills" and reports that he is open to outpatient substance use resources. SW to assist with resources prior to dc. ? ?Stay Summary:  ?46 year old male with history of polysubstance abuse, depression and self-reported paranoia who reported who presented to the behavioral health urgent care on 01/23/2022 with request for detox from opioids.  Patient reported using opioids over the last 2 years daily.  UDS + cocaine, marijuana, oxycodone, morphine, benzodiazepines; EtOH negative.  Patient was admitted to the [facility] on day of presentation and started on a COWS protocol.  Patient declined initiating antidepressant.  COWS scores ranged between 0 and 1 throughout admission.  The following day, 01/24/2022 patient requested discharge citing his need to resume working in order to provide for his family.  Patient requested outpatient substance use resources in discharge.Patient persistently denied SI/HI/AVH throughout his admission and does not meet criteria for IVC.  He was discharged per request ? ? ? ?Total Time spent with  patient: 20 minutes ? ?Past Psychiatric History:  ?of polysubstance abuse, depression and self-reported paranoia  ?Past Medical History:  ?Past Medical History:  ?Diagnosis Date  ? Anxiety   ? Chronic kidney disease   ? Cocaine abuse ([place])   ? Depression   ? GERD (gastroesophageal reflux disease)   ? Polysubstance abuse ([place])   ? Seizures ([place])   ? Sinus infection   ?  ?Past Surgical History:  ?Procedure Laterality Date  ? FEMUR SURGERY    ? ?Family History:  ?Family History  ?Problem Relation Age of Onset  ? Hypertension Mother   ? Cancer Mother   ? ?Family Psychiatric History:  ?Denies psychiatric history ?Denies substance use history ?Denies history of attempted or completed suicides ?Social History:  ?Social History  ? ?Substance and Sexual Activity  ?Alcohol Use Not Currently  ? Comment: 2 times a week.   ?   ?Social History  ? ?Substance and Sexual Activity  ?Drug Use Yes  ? Types: Cocaine, Marijuana, Benzodiazepines  ?  ?Social History  ? ?Socioeconomic History  ? Marital status: Significant Other  ?  Spouse name: Not on file  ? Number of children: Not on file  ? Years of education: Not on file  ? Highest education level: Not on file  ?Occupational History  ? Not on file  ?Tobacco Use  ? Smoking status: Some Days  ?  Packs/day: 0.50  ?  Types: Cigarettes  ? Smokeless tobacco: Current  ?Substance and Sexual Activity  ? Alcohol use: Not Currently  ?  Comment: 2 times a  week.   ? Drug use: Yes  ?  Types: Cocaine, Marijuana, Benzodiazepines  ? Sexual activity: Yes  ?  Birth control/protection: Condom  ?Other Topics Concern  ? Not on file  ?Social History Narrative  ? ** Merged History Encounter **  ?    ? ?Social Determinants of Health  ? ?Financial Resource Strain: Not on file  ?Food Insecurity: Not on file  ?Transportation Needs: Not on file  ?Physical Activity: Not on file  ?Stress: Not on file  ?Social Connections: Not on file  ? ?SDOH:  ?SDOH Screenings  ? ?Alcohol Screen: Not on file  ?Depression (PHQ2-9):  Medium Risk  ? PHQ-2 Score: 17  ?Financial Resource Strain: Not on file  ?Food Insecurity: Not on file  ?Housing: Not on file  ?Physical Activity: Not on file  ?Social Connections: Not on file  ?Stress: Not on file  ?Tobacco Use: High Risk  ? Smoking Tobacco Use: Some Days  ? Smokeless Tobacco Use: Current  ? Passive Exposure: Not on file  ?Transportation Needs: Not on file  ? ? ?Tobacco Cessation:  Prescription not provided because: n/a ? ?Current Medications:  ?Current Facility-Administered Medications  ?Medication Dose Route Frequency Provider Last Rate Last Admin  ? alum & mag hydroxide-simeth (MAALOX/MYLANTA) 200-200-20 MG/5ML suspension 30 mL  30 mL Oral Q4H PRN Ajibola, Ene A, NP      ? cloNIDine (CATAPRES) tablet 0.1 mg  0.1 mg Oral Q4H PRN Ival Bible, MD      ? dicyclomine (BENTYL) tablet 20 mg  20 mg Oral Q6H PRN Ajibola, Ene A, NP      ? hydrOXYzine (ATARAX) tablet 25 mg  25 mg Oral Q6H PRN Ajibola, Ene A, NP      ? loperamide (IMODIUM) capsule 2-4 mg  2-4 mg Oral PRN Ajibola, Ene A, NP      ? magnesium hydroxide (MILK OF MAGNESIA) suspension 30 mL  30 mL Oral Daily PRN Ajibola, Ene A, NP      ? methocarbamol (ROBAXIN) tablet 500 mg  500 mg Oral Q8H PRN Ajibola, Ene A, NP      ? naproxen (NAPROSYN) tablet 500 mg  500 mg Oral BID PRN Ajibola, Ene A, NP      ? ondansetron (ZOFRAN-ODT) disintegrating tablet 4 mg  4 mg Oral Q6H PRN Ajibola, Ene A, NP      ? traZODone (DESYREL) tablet 50 mg  50 mg Oral QHS PRN Ajibola, Ene A, NP      ? ?Current Outpatient Medications  ?Medication Sig Dispense Refill  ? ibuprofen (ADVIL) 800 MG tablet Take 800 mg by mouth every 8 (eight) hours as needed for headache.    ? ? ?PTA Medications: (Not in a hospital admission) ? ? ?Musculoskeletal  ?Strength & Muscle Tone: within normal limits ?Gait & Station: normal ?Patient leans: N/A ? ?Psychiatric Specialty Exam  ?Presentation  ?General Appearance: Appropriate for Environment; Casual ? ?Eye Contact:Good ? ?Speech:Clear  and Coherent; Normal Rate ? ?Speech Volume:Normal ? ?Handedness:No data recorded ? ?Mood and Affect  ?Mood:-- ("ok") ? ?Affect:Flat; Appropriate ? ? ?Thought Process  ?Thought Processes:Coherent ? ?Descriptions of Associations:Intact ? ?Orientation:Full (Time, Place and Person) ? ?Thought Content:Logical; WDL ? Diagnosis of Schizophrenia or Schizoaffective disorder in past: No ?  ? Hallucinations:Hallucinations: None ? ?Ideas of Reference:None ? ?Suicidal Thoughts:Suicidal Thoughts: No ? ?Homicidal Thoughts:Homicidal Thoughts: No ? ? ?Sensorium  ?Memory:Immediate Good; Recent Good; Remote Good ? ?Judgment:Good ? ?Insight:Good ? ? ?Executive Functions  ?Concentration:Good ? ?Attention  Span:Good ? ?Recall:Good ? ?Fund of Excel ? ?Language:Good ? ? ?Psychomotor Activity  ?Psychomotor Activity:Psychomotor Activity: Normal ? ? ?Assets  ?Assets:Desire for Improvement; Social Support; Resilience; Physical Health; Communication Skills; Vocational/Educational ? ? ?Sleep  ?Sleep:Sleep: Fair ? ? ?No data recorded ? ?Physical Exam  ?Physical Exam ?Constitutional:   ?   Appearance: Normal appearance. He is normal weight.  ?HENT:  ?   Head: Normocephalic and atraumatic.  ?Eyes:  ?   Extraocular Movements: Extraocular movements intact.  ?Pulmonary:  ?   Effort: Pulmonary effort is normal.  ?Neurological:  ?   General: No focal deficit present.  ?   Mental Status: He is alert and oriented to person, place, and time.  ?Psychiatric:     ?   Attention and Perception: Attention and perception normal.     ?   Speech: Speech normal.     ?   Behavior: Behavior normal. Behavior is cooperative.     ?   Thought Content: Thought content normal.  ? ?Review of Systems  ?Constitutional:  Negative for chills and fever.  ?HENT:  Negative for hearing loss.   ?Eyes:  Negative for discharge and redness.  ?Respiratory:  Negative for cough.   ?Cardiovascular:  Negative for chest pain.  ?Gastrointestinal:  Negative for abdominal pain.   ?Musculoskeletal:  Negative for myalgias.  ?Neurological:  Negative for headaches.  ?Psychiatric/Behavioral:  Positive for substance abuse. Negative for depression, hallucinations and suicidal ideas.   ?Blood pre

## 2022-01-24 NOTE — ED Notes (Signed)
Pt got up and declined breakfast ?

## 2022-01-24 NOTE — ED Notes (Signed)
Patient has been sleeping through out the shift. Patient did not get out of bed, only to toilet. Patient did not attend group.  This writer attempted to talk to patient but patient did not want to talk and covered his head with blanket.  ?

## 2022-01-24 NOTE — Progress Notes (Signed)
Charels woke up on his own this AM, refused breakfast and later made several phone calls. Later he decided he wanted to leave, he spoke with the provider and received his discharge order. He showered and made several calls. He verbalized feeling safe to leave. He received his AVS, questions answered and his personal belongings were retrieved. He was discharged without incident at 1029 hrs. ?

## 2022-01-24 NOTE — Clinical Social Work Psych Note (Addendum)
LCSW Initial/Discharge Note ? ?LCSW met with patient for introduction and to begin discussions regarding treatment and potential discharge planning.  ? ?Henry Stanley presented with a dysphoric affect, congruent mood. He appeared to be slightly agitated, however he was pleasant and cooperative with this Probation officer and Dr. Serafina Mitchell, MD. Henry Stanley denied having any SI, HI or AVH at this time. He also denied having any physical or withdrawal complaints.  ? ?Henry Stanley requested to be discharged as he learned of issues with his family and their living arrangements. Henry Stanley shared that he is the only person who has income, and that he had to take care of obligations today.  ? ?Henry Stanley shared that he is established with [facility] [employer] for substance abuse issues. Henry Stanley reports his case manager is assisting him in finding possible residential treatment services. He reports that he is unsure if his case manager at [facility] was arranging treatment, or if he needed to ask for assistance here at the [facility].  ? ?LCSW explained to [facility] that if he were to be discharged today, the appropriate assistance would be resources for him to follow up on.  LCSW also ensured that [name] understood that he likely would have to re-start the detox process over to be considered for a residential substance abuse treatment program.  ? ?[name] expressed understanding and was agreeable with outpatient resources.  ? ?[name] requested to be discharged. [name] requested transportation assistance. LCSW authorized a taxi voucher for assistance.  ? ?LCSW charted various resources for substance abuse and psychiatric care services. [name] denied having any additional questions, concerns or needs at discharge.  ? ?LCSW signing off.  ? ? ?Radonna Ricker, MSW, LCSW ?Clinical Education officer, museum Insurance claims handler) ?[facility]  ? ?

## 2022-04-26 ENCOUNTER — Encounter: Payer: Self-pay | Admitting: Nurse Practitioner

## 2022-04-28 ENCOUNTER — Ambulatory Visit: Payer: Self-pay | Attending: Nurse Practitioner | Admitting: Nurse Practitioner

## 2022-04-28 ENCOUNTER — Encounter: Payer: Self-pay | Admitting: Nurse Practitioner

## 2022-04-28 VITALS — BP 115/70 | HR 76 | Temp 98.1°F | Ht 66.0 in | Wt 149.6 lb

## 2022-04-28 DIAGNOSIS — G8929 Other chronic pain: Secondary | ICD-10-CM

## 2022-04-28 DIAGNOSIS — D649 Anemia, unspecified: Secondary | ICD-10-CM

## 2022-04-28 DIAGNOSIS — Z0000 Encounter for general adult medical examination without abnormal findings: Secondary | ICD-10-CM

## 2022-04-28 DIAGNOSIS — R569 Unspecified convulsions: Secondary | ICD-10-CM

## 2022-04-28 DIAGNOSIS — N179 Acute kidney failure, unspecified: Secondary | ICD-10-CM

## 2022-04-28 DIAGNOSIS — M25552 Pain in left hip: Secondary | ICD-10-CM

## 2022-04-28 MED ORDER — IBUPROFEN 600 MG PO TABS: 600.0000 mg | ORAL_TABLET | Freq: Three times a day (TID) | ORAL | 1 refills | Status: AC | PRN

## 2022-04-28 NOTE — Progress Notes (Signed)
Assessment & Plan:  Henry Stanley was seen today for annual exam.  Diagnoses and all orders for this visit:  Encounter for annual physical exam -     CMP14+EGFR Counseled on 150 minutes of exercise per week, healthy eating (including decreased daily intake of saturated fats, cholesterol, added sugars, sodium), routine healthcare maintenance.    Seizure  Resolved. >10 years ago.    Anemia, unspecified type +FOBT -     CBC with Differential -     Ambulatory referral to Gastroenterology  AKI (acute kidney injury) [facility]) -     CMP14+EGFR    Patient has been counseled on age-appropriate routine health concerns for screening and prevention. These are reviewed and up-to-date. Referrals have been placed accordingly. Immunizations are up-to-date or declined.    Subjective:   Chief Complaint  Patient presents with   Annual Exam   HPI Henry Stanley 46 y.o. male presents to office today for annual exam. He is accompanied by a male friend today.  PMH: polysubstance abuse (cocaine, marijuana, oxycodone, morphine, benzodiazepines), depression, paranoia (Self reported), anxiety, CKD, GERD, seizures (last seizure 10 years ago and related to ETOH).      Review of Systems  Constitutional:  Negative for fever, malaise/fatigue and weight loss.  HENT: Negative.  Negative for nosebleeds.   Eyes: Negative.  Negative for blurred vision, double vision and photophobia.  Respiratory: Negative.  Negative for cough and shortness of breath.   Cardiovascular: Negative.  Negative for chest pain, palpitations and leg swelling.  Gastrointestinal: Negative.  Negative for heartburn, nausea and vomiting.  Genitourinary: Negative.   Musculoskeletal: Negative.  Negative for myalgias.  Skin: Negative.   Neurological: Negative.  Negative for dizziness, focal weakness, seizures and headaches.  Endo/Heme/Allergies: Negative.   Psychiatric/Behavioral: Negative.  Negative for suicidal ideas.     Past Medical  History:  Diagnosis Date   Anxiety    Chronic kidney disease    Cocaine abuse ([place])    Depression    GERD (gastroesophageal reflux disease)    Polysubstance abuse ([facility])    Seizures ([facility])    Sinus infection     Past Surgical History:  Procedure Laterality Date   FEMUR SURGERY      Family History  Problem Relation Age of Onset   Hypertension Mother    Cancer Mother     Social History Reviewed with no changes to be made today.   No outpatient medications prior to visit.   No facility-administered medications prior to visit.    Allergies  Allergen Reactions   Sumatriptan Shortness Of Breath, Nausea And Vomiting, Palpitations and Other (See Comments)    Increased BP also   Acetaminophen Other (See Comments)    reaction to migraine cocktail - rectal burning and agitation   Benadryl [Diphenhydramine Hcl] Other (See Comments)     reaction to migraine cocktail - rectal burning and agitation       Objective:    BP 115/70   Pulse 76   Temp 98.1 F (36.7 C) (Oral)   Ht _0  (1.676 m)   Wt 149 lb 9.6 oz (67.9 kg)   SpO2 100%   BMI 24.15 kg/m  Wt Readings from Last 3 Encounters:  04/28/22 149 lb 9.6 oz (67.9 kg)  08/19/21 160 lb (72.6 kg)  05/31/21 160 lb (72.6 kg)    Physical Exam Constitutional:      Appearance: He is well-developed.  HENT:     Head: Normocephalic and atraumatic.  Right Ear: Hearing, tympanic membrane, ear canal and external ear normal.     Left Ear: Hearing, tympanic membrane, ear canal and external ear normal.     Nose: Nose normal. No mucosal edema or rhinorrhea.     Right Turbinates: Not enlarged.     Left Turbinates: Not enlarged.     Mouth/Throat:     Lips: Pink.     Mouth: Mucous membranes are moist.     Dentition: No gingival swelling, dental abscesses or gum lesions.     Pharynx: Uvula midline.     Tonsils: No tonsillar exudate. 1+ on the right. 1+ on the left.  Eyes:     General: Lids are normal. No scleral icterus.     Extraocular Movements: Extraocular movements intact.     Conjunctiva/sclera: Conjunctivae normal.     Pupils: Pupils are equal, round, and reactive to light.  Neck:     Thyroid: No thyromegaly.     Trachea: No tracheal deviation.  Cardiovascular:     Rate and Rhythm: Normal rate and regular rhythm.     Heart sounds: Normal heart sounds. No murmur heard.    No friction rub. No gallop.  Pulmonary:     Effort: Pulmonary effort is normal. No respiratory distress.     Breath sounds: Normal breath sounds. No wheezing or rales.  Chest:     Chest wall: No mass or tenderness.  Breasts:    Right: No inverted nipple, mass, nipple discharge, skin change or tenderness.     Left: No inverted nipple, mass, nipple discharge, skin change or tenderness.  Abdominal:     General: Bowel sounds are normal. There is no distension.     Palpations: Abdomen is soft. There is no mass.     Tenderness: There is no abdominal tenderness. There is no guarding or rebound.  Musculoskeletal:        General: No tenderness or deformity. Normal range of motion.     Cervical back: Normal range of motion and neck supple.     Comments: Ankle monitor  Lymphadenopathy:     Cervical: No cervical adenopathy.  Skin:    General: Skin is warm and dry.     Capillary Refill: Capillary refill takes less than 2 seconds.     Findings: No erythema.  Neurological:     Mental Status: He is alert and oriented to person, place, and time.     Cranial Nerves: No cranial nerve deficit.     Sensory: Sensation is intact.     Motor: No abnormal muscle tone.     Coordination: Coordination is intact. Coordination normal.     Gait: Gait is intact.     Deep Tendon Reflexes: Reflexes normal.     Reflex Scores:      Patellar reflexes are 1+ on the right side and 1+ on the left side. Psychiatric:        Attention and Perception: Attention normal.        Mood and Affect: Mood normal.        Speech: Speech normal.        Behavior: Behavior  normal.        Thought Content: Thought content normal.        Judgment: Judgment normal.          Patient has been counseled extensively about nutrition and exercise as well as the importance of adherence with medications and regular follow-up. The patient was given clear instructions to go to ER  or return to medical center if symptoms don't improve, worsen or new problems develop. The patient verbalized understanding.   Follow-up: Return if symptoms worsen or fail to improve.   Gildardo Pounds, FNP-BC [place] and Melbeta Braxton, [place]   04/28/2022, 1:57 PM

## 2022-04-29 LAB — CMP14+EGFR
ALT: 11 IU/L (ref 0–44)
AST: 12 IU/L (ref 0–40)
Albumin/Globulin Ratio: 1.8 (ref 1.2–2.2)
Albumin: 4.5 g/dL (ref 4.1–5.1)
Alkaline Phosphatase: 72 IU/L (ref 44–121)
BUN/Creatinine Ratio: 13 (ref 9–20)
BUN: 16 mg/dL (ref 6–24)
Bilirubin Total: 0.5 mg/dL (ref 0.0–1.2)
CO2: 23 mmol/L (ref 20–29)
Calcium: 10 mg/dL (ref 8.7–10.2)
Chloride: 104 mmol/L (ref 96–106)
Creatinine, Ser: 1.21 mg/dL (ref 0.76–1.27)
Globulin, Total: 2.5 g/dL (ref 1.5–4.5)
Glucose: 117 mg/dL — ABNORMAL HIGH (ref 70–99)
Potassium: 4.2 mmol/L (ref 3.5–5.2)
Sodium: 140 mmol/L (ref 134–144)
Total Protein: 7 g/dL (ref 6.0–8.5)
eGFR: 75 mL/min/{1.73_m2} (ref >59)

## 2022-04-29 LAB — CBC WITH DIFFERENTIAL/PLATELET
Basophils Absolute: 0.1 10*3/uL (ref 0.0–0.2)
Basos: 1 %
EOS (ABSOLUTE): 0.2 10*3/uL (ref 0.0–0.4)
Eos: 3 %
Hematocrit: 41 % (ref 37.5–51.0)
Hemoglobin: 13.8 g/dL (ref 13.0–17.7)
Immature Grans (Abs): 0 10*3/uL (ref 0.0–0.1)
Immature Granulocytes: 0 %
Lymphocytes Absolute: 3.1 10*3/uL (ref 0.7–3.1)
Lymphs: 43 %
MCH: 32.4 pg (ref 26.6–33.0)
MCHC: 33.7 g/dL (ref 31.5–35.7)
MCV: 96 fL (ref 79–97)
Monocytes Absolute: 0.4 10*3/uL (ref 0.1–0.9)
Monocytes: 6 %
Neutrophils Absolute: 3.4 10*3/uL (ref 1.4–7.0)
Neutrophils: 47 %
Platelets: 182 10*3/uL (ref 150–450)
RBC: 4.26 x10E6/uL (ref 4.14–5.80)
RDW: 11.8 % (ref 11.6–15.4)
WBC: 7.1 10*3/uL (ref 3.4–10.8)

## 2022-05-05 ENCOUNTER — Telehealth: Payer: Self-pay

## 2022-05-05 NOTE — Telephone Encounter (Signed)
Pt given lab results per notes of Zelda, NP on 05/05/22. Pt verbalized understanding.

## 2022-05-10 ENCOUNTER — Ambulatory Visit: Payer: Self-pay

## 2022-05-10 NOTE — Telephone Encounter (Signed)
Call dropped, agent reports pt. Having abdominal pain. Left message to call back about symptoms.

## 2022-06-15 ENCOUNTER — Ambulatory Visit: Payer: Self-pay | Admitting: *Deleted

## 2022-06-15 NOTE — Telephone Encounter (Signed)
  Chief Complaint: Abdominal pain Symptoms: Mid stomach, comes and goes, 7/10 at times worse. LBM 4 days ago, small, hard. H/O constipation. Frequency: 3 days Pertinent Negatives: Patient denies  Disposition: [] ED /[] Urgent Care (no appt availability in office) / [] Appointment(In office/virtual)/ []  [facility]/ [] Home Care/ [] Refused Recommended Disposition /[x] [place] Mobile [facility]/ []  Follow-up with PCP Additional Notes: Pt states taking multiple OTC meds for constipation. "Been going on a long time off and on." [facility] for acute issue. Pt requesting referral for colonoscopy.  Please advise. Reason for Disposition  [1] MODERATE pain (e.g., interferes with normal activities) AND [2] pain comes and goes (cramps) AND [3] present > 24 hours  (Exception: Pain with Vomiting or Diarrhea - see that Guideline.)  Answer Assessment - Initial Assessment Questions 1. LOCATION: "Where does it hurt?"      Middle 2. RADIATION: "Does the pain shoot anywhere else?" (e.g., chest, back)     No 3. ONSET: "When did the pain begin?" (Minutes, hours or days ago)      3 days ago, comes and goes 4. SUDDEN: "Gradual or sudden onset?"     Gradually 5. PATTERN "Does the pain come and go, or is it constant?"    - If it comes and goes: "How long does it last?" "Do you have pain now?"     (Note: Comes and goes means the pain is intermittent. It goes away completely between bouts.)    - If constant: "Is it getting better, staying the same, or getting worse?"      (Note: Constant means the pain never goes away completely; most serious pain is constant and gets worse.)     Comes and goes 6. SEVERITY: "How bad is the pain?"  (e.g., Scale 1-10; mild, moderate, or severe)    - MILD (1-3): Doesn't interfere with normal activities, abdomen soft and not tender to touch.     - MODERATE (4-7): Interferes with normal activities or awakens from sleep, abdomen tender to touch.     - SEVERE (8-10):  Excruciating pain, doubled over, unable to do any normal activities.       At worse 7.5/10  A lot worse at times 7. RECURRENT SYMPTOM: "Have you ever had this type of stomach pain before?" If Yes, ask: "When was the last time?" and "What happened that time?"      Yes 8. CAUSE: "What do you think is causing the stomach pain?"     Constipation 9. RELIEVING/AGGRAVATING FACTORS: "What makes it better or worse?" (e.g., antacids, bending or twisting motion, bowel movement)     no 10. OTHER SYMPTOMS: "Do you have any other symptoms?" (e.g., back pain, diarrhea, fever, urination pain, vomiting)       Constipation. LBM 4 days ago.  Protocols used: Abdominal Pain - Male-A-AH

## 2022-06-15 NOTE — Telephone Encounter (Signed)
Unable to reach patient by phone to relay response. Referral to GI already placed in [facility] [address] [place], [place] [address] PH# [phone].

## 2023-05-23 ENCOUNTER — Ambulatory Visit: Payer: Self-pay | Admitting: *Deleted

## 2023-05-23 ENCOUNTER — Encounter: Payer: Self-pay | Admitting: Nurse Practitioner

## 2023-05-23 NOTE — Telephone Encounter (Signed)
  Chief Complaint: stomach and rectal pain. Unable to contact patient to review sx.  Symptoms: na Frequency: na Pertinent Negatives: Patient denies na Disposition: [] ED /[] Urgent Care (no appt availability in office) / [] Appointment(In office/virtual)/ []  [place] [facility]/ [] Home Care/ [] Refused Recommended Disposition /[]  Mobile Bus/ [x]  Follow-up with PCP Additional Notes:   Called patient [phone] to review sx. Patient friend Tami Ribas answered phone and patient not available . Message left to have patient call back . Future appt 06/20/23.     Reason for Disposition  Message left with person in household.  Answer Assessment - Initial Assessment Questions N/A No contact with patient to triage pain in stomach and rectum. Tami Ribas answered [phone] and NT left message for patient to call back.  Protocols used: No Contact or Duplicate Contact Call-A-AH

## 2023-05-23 NOTE — Telephone Encounter (Signed)
Call placed to patient . Patient's  girlfriend voiced she has his phone and she on the way home now and will have him to call back.

## 2023-06-20 ENCOUNTER — Encounter: Payer: Self-pay | Admitting: Nurse Practitioner
